# Patient Record
Sex: Female | Born: 1960 | Race: White | Hispanic: No | Marital: Married | Smoking: Never smoker
Health system: Southern US, Community
[De-identification: ages and names within clinical notes are randomized; demographics above are authoritative.]

## PROBLEM LIST (undated history)

## (undated) DIAGNOSIS — R232 Flushing: Secondary | ICD-10-CM

## (undated) DIAGNOSIS — Z923 Personal history of irradiation: Secondary | ICD-10-CM

## (undated) DIAGNOSIS — N939 Abnormal uterine and vaginal bleeding, unspecified: Secondary | ICD-10-CM

## (undated) DIAGNOSIS — C50919 Malignant neoplasm of unspecified site of unspecified female breast: Secondary | ICD-10-CM

## (undated) DIAGNOSIS — Z9221 Personal history of antineoplastic chemotherapy: Secondary | ICD-10-CM

## (undated) DIAGNOSIS — N926 Irregular menstruation, unspecified: Secondary | ICD-10-CM

## (undated) DIAGNOSIS — I499 Cardiac arrhythmia, unspecified: Secondary | ICD-10-CM

## (undated) DIAGNOSIS — R112 Nausea with vomiting, unspecified: Secondary | ICD-10-CM

## (undated) DIAGNOSIS — N840 Polyp of corpus uteri: Secondary | ICD-10-CM

## (undated) DIAGNOSIS — Z9889 Other specified postprocedural states: Secondary | ICD-10-CM

## (undated) DIAGNOSIS — R87629 Unspecified abnormal cytological findings in specimens from vagina: Secondary | ICD-10-CM

## (undated) HISTORY — DX: Polyp of corpus uteri: N84.0

## (undated) HISTORY — DX: Cardiac arrhythmia, unspecified: I49.9

## (undated) HISTORY — DX: Flushing: R23.2

## (undated) HISTORY — DX: Irregular menstruation, unspecified: N92.6

## (undated) HISTORY — PX: CHOLECYSTECTOMY: SHX55

## (undated) HISTORY — DX: Unspecified abnormal cytological findings in specimens from vagina: R87.629

## (undated) HISTORY — DX: Malignant neoplasm of unspecified site of unspecified female breast: C50.919

## (undated) HISTORY — DX: Abnormal uterine and vaginal bleeding, unspecified: N93.9

## (undated) HISTORY — PX: SHOULDER SURGERY: SHX246

---

## 1991-01-25 HISTORY — PX: CHOLECYSTECTOMY: SHX55

## 1997-06-13 ENCOUNTER — Other Ambulatory Visit: Admission: RE | Admit: 1997-06-13 | Discharge: 1997-06-13 | Payer: Self-pay | Admitting: Obstetrics and Gynecology

## 1998-06-16 ENCOUNTER — Other Ambulatory Visit: Admission: RE | Admit: 1998-06-16 | Discharge: 1998-06-16 | Payer: Self-pay | Admitting: Obstetrics and Gynecology

## 1998-06-16 ENCOUNTER — Other Ambulatory Visit: Admission: RE | Admit: 1998-06-16 | Discharge: 1998-06-16 | Payer: Self-pay | Admitting: *Deleted

## 1998-06-26 ENCOUNTER — Other Ambulatory Visit: Admission: RE | Admit: 1998-06-26 | Discharge: 1998-06-26 | Payer: Self-pay | Admitting: General Surgery

## 2000-01-25 HISTORY — PX: OTHER SURGICAL HISTORY: SHX169

## 2000-02-02 ENCOUNTER — Other Ambulatory Visit: Admission: RE | Admit: 2000-02-02 | Discharge: 2000-02-02 | Payer: Self-pay | Admitting: Obstetrics and Gynecology

## 2001-05-15 ENCOUNTER — Other Ambulatory Visit: Admission: RE | Admit: 2001-05-15 | Discharge: 2001-05-15 | Payer: Self-pay | Admitting: Obstetrics and Gynecology

## 2017-06-12 ENCOUNTER — Other Ambulatory Visit: Payer: Self-pay | Admitting: Orthopedic Surgery

## 2017-06-12 DIAGNOSIS — S43431A Superior glenoid labrum lesion of right shoulder, initial encounter: Secondary | ICD-10-CM

## 2017-06-26 ENCOUNTER — Ambulatory Visit
Admission: RE | Admit: 2017-06-26 | Discharge: 2017-06-26 | Disposition: A | Discharge status: Home / Self Care | Payer: PRIVATE HEALTH INSURANCE | Source: Ambulatory Visit | Attending: Orthopedic Surgery | Admitting: Orthopedic Surgery

## 2017-06-26 DIAGNOSIS — S43431A Superior glenoid labrum lesion of right shoulder, initial encounter: Secondary | ICD-10-CM

## 2017-06-26 MED ORDER — IOPAMIDOL (ISOVUE-M 200) INJECTION 41%
15.0000 mL | Freq: Once | INTRAMUSCULAR | Status: AC
  Administered 2017-06-26: 15 mL via INTRA_ARTICULAR

## 2017-08-15 ENCOUNTER — Encounter: Payer: Self-pay | Admitting: Physical Therapy

## 2017-08-15 ENCOUNTER — Other Ambulatory Visit: Payer: Self-pay

## 2017-08-15 ENCOUNTER — Ambulatory Visit: Payer: No Typology Code available for payment source | Attending: Orthopedic Surgery | Admitting: Physical Therapy

## 2017-08-15 DIAGNOSIS — M25511 Pain in right shoulder: Secondary | ICD-10-CM

## 2017-08-15 DIAGNOSIS — M6281 Muscle weakness (generalized): Secondary | ICD-10-CM | POA: Insufficient documentation

## 2017-08-15 DIAGNOSIS — M25611 Stiffness of right shoulder, not elsewhere classified: Secondary | ICD-10-CM | POA: Insufficient documentation

## 2017-08-15 NOTE — Therapy (Signed)
[facility] [address]  [place], [place], [address] Phone: [phone]   Fax:  [phone]  Physical Therapy Evaluation  Patient Details  Name: [name] [name] MRN: [id number] Date of Birth: 1960-04-19 Referring Provider: Tania Ade, MD   Encounter Date: 08/15/2017  PT End of Session - 08/15/17 0854    Visit Number  1    Number of Visits  17    Date for PT Re-Evaluation  10/10/17    Authorization Type  [employer] [place] [employer]    PT Start Time  0803    PT Stop Time  0848    PT Time Calculation (min)  45 min    Activity Tolerance  Patient limited by pain;Patient tolerated treatment well    Behavior During Therapy  [facility] for tasks assessed/performed       History reviewed. No pertinent past medical history.  History reviewed. No pertinent surgical history.  There were no vitals filed for this visit.   Subjective Assessment - 08/15/17 0808    Subjective  Patient reports undergoing R RTC repair on 07/31/17. Had her follow-up with MD on 08/09/17- patient reports MD took out stitches and allowed her to be out of sling intermittently. Still sleeps with sling and sleeps on L side and back. Reports no pain in R shoulder since the surgery. Initial tear occurred after falling and tripping over a piece of twine. 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     Limitations  Lifting;House hold activities    Diagnostic tests  06/26/17 R shoulder MRI: supraspinatus and infraspinatus small full-thickness tear. 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    Patient Stated Goals  move my arm and get back to work    Currently in Pain?  No/denies    Pain Location  Shoulder    Pain Orientation  Right    Pain Type  Surgical pain;Acute pain    Aggravating Factors   moving    Pain Relieving Factors  none         [facility] PT Assessment - 08/15/17 0815      Assessment   Medical  Diagnosis  R RTC Repair    Referring Provider  Tania Ade, MD    Onset Date/Surgical Date  07/31/17    Hand Dominance  Right    Next MD Visit  09/13/17    Prior Therapy  No      Precautions   Precautions  Shoulder    Precaution Comments  No lifting, overhead reaching      Restrictions   Weight Bearing Restrictions  Yes    RUE Weight Bearing  Non weight bearing      Balance Screen   Has the patient fallen in the past 6 months  Yes    How many times?  1 tripped over rope    Has the patient had a decrease in activity level because of a fear of falling?   No    Is the patient reluctant to leave their home because of a fear of falling?   No      Home Social worker  Private residence    Living Arrangements  Spouse/significant other    Available Help at Discharge  Family    Type of [place] to enter    Entrance Stairs-Number of Steps  5    Entrance Stairs-Rails  Left    Home Layout  One level      Prior Function   Level of Independence  Independent      Cognition   Overall Cognitive Status  Within Functional Limits for tasks assessed      Observation/Other Assessments   Observations  R mid humerus bruised      Sensation   Light Touch  Appears Intact      Coordination   Gross Motor Movements are Fluid and Coordinated  Yes      Posture/Postural Control   Posture/Postural Control  Postural limitations    Postural Limitations  Rounded Shoulders      ROM / Strength   AROM / PROM / Strength  Strength;PROM;AROM      AROM   AROM Assessment Site  Shoulder    Right/Left Shoulder  Left    Left Shoulder Flexion  159 Degrees    Left Shoulder ABduction  180 Degrees    Left Shoulder Internal Rotation  76 Degrees    Left Shoulder External Rotation  110 Degrees      PROM   PROM Assessment Site  Shoulder    Right/Left Shoulder  Left;Right    Right Shoulder Flexion  95 Degrees pain    Right Shoulder ABduction  50 Degrees pain     Right Shoulder Internal Rotation  65 Degrees with R UE in 45 deg ABD    Right Shoulder External Rotation  37 Degrees with R UE in 45 deg ABD    Left Shoulder Flexion  180 Degrees    Left Shoulder ABduction  180 Degrees    Left Shoulder Internal Rotation  95 Degrees    Left Shoulder External Rotation  97 Degrees      Strength   Strength Assessment Site  Shoulder    Right/Left Shoulder  Left    Left Shoulder Flexion  4+/5    Left Shoulder ABduction  4+/5    Left Shoulder Internal Rotation  4+/5    Left Shoulder External Rotation  4+/5                Objective measurements completed on examination: See above findings.              PT Education - 08/15/17 0851    Education Details  prognosis, POC, HEP    Person(s) Educated  Patient    Methods  Explanation;Demonstration;Tactile cues;Verbal cues;Handout    Comprehension  Returned demonstration;Verbalized understanding       PT Short Term Goals - 08/15/17 0903      PT SHORT TERM GOAL #1   Title  Patient to be independent with initial HEP.    Time  4    Period  Weeks    Status  New    Target Date  09/12/17        PT Long Term Goals - 08/15/17 0904      PT LONG TERM GOAL #1   Title  Patient to be independent with advanced HEP.    Time  8    Period  Weeks    Status  New    Target Date  10/10/17      PT LONG TERM GOAL #2   Title  Patient to demonstrate [facility] R shoulder AROM/PROM without pain limiting.     Time  8    Period  Weeks    Status  New    Target Date  10/10/17      PT LONG TERM  GOAL #3   Title  Patient to demonstrate R shoulder strength >=4+/5.    Time  8    Period  Weeks    Status  New    Target Date  10/10/17      PT LONG TERM GOAL #4   Title  Patient to demonstrate reaching overhead to place 5# object on overhead cabinet with <=2/10 pain.    Time  8    Period  Weeks    Status  New    Target Date  10/10/17      PT LONG TERM GOAL #5   Title  Patient to report tolerance of 1 day  at work with <=2/10 pain.    Time  8    Period  Weeks    Status  New    Target Date  10/10/17             Plan - 08/15/17 0854    Clinical Impression Statement  Patient is a 57y/o F presenting to OPPT after R RTC repair on 07/31/17. Patient reports getting stitches out at follow up appointment last week and released to be out of sling intermittently. Reports compliance with surgical precautions and no c/o pain since surgery. Patient today with limited and painful R shoulder PROM and limited functional activity tolerance. Educated patient on surgical precautions and HEP and a=given handout. Advised not to push into pain as patient has tendency to do this. Patient reported understanding. Would benefit from skilled PT services 2x/week for 8 weeks to address decreased ROM, strength, pain, and limited functional activity tolerance.     Clinical Presentation  Stable    Clinical Decision Making  Low    Rehab Potential  Good    PT Frequency  2x / week    PT Duration  8 weeks    PT Treatment/Interventions  ADLs/Self Care Home Management;Cryotherapy;Electrical Stimulation;Moist Heat;Ultrasound;Gait training;Stair training;Functional mobility training;Therapeutic activities;Therapeutic exercise;Manual techniques;Patient/family education;Scar mobilization;Passive range of motion;Dry needling;Energy conservation;Splinting;Taping;Vasopneumatic Device    PT Next Visit Plan  reassess HEP    Consulted and Agree with Plan of Care  Patient       Patient will benefit from skilled therapeutic intervention in order to improve the following deficits and impairments:  Decreased activity tolerance, Decreased strength, Impaired UE functional use, Pain, Decreased range of motion, Improper body mechanics, Postural dysfunction  Visit Diagnosis: Acute pain of right shoulder  Stiffness of right shoulder, not elsewhere classified  Muscle weakness (generalized)     Problem List There are no active problems to  display for this patient.   Manuela Neptune 08/15/2017, 9:08 AM  [facility] [address]  [place], [place], [address] Phone: [phone]   Fax:  [phone]  Name: [name] [name] MRN: [id number] Date of Birth: 1960-06-27

## 2017-08-18 ENCOUNTER — Ambulatory Visit: Payer: No Typology Code available for payment source

## 2017-08-18 DIAGNOSIS — M25511 Pain in right shoulder: Secondary | ICD-10-CM

## 2017-08-18 DIAGNOSIS — M25611 Stiffness of right shoulder, not elsewhere classified: Secondary | ICD-10-CM

## 2017-08-18 DIAGNOSIS — M6281 Muscle weakness (generalized): Secondary | ICD-10-CM

## 2017-08-18 NOTE — Therapy (Signed)
[facility] [address]  [place], [place], [address] Phone: [phone]   Fax:  [phone]  Physical Therapy Treatment  Patient Details  Name: [name] [name] MRN: [id number] Date of Birth: Jun 21, 1960 Referring Provider: Tania Ade, MD   Encounter Date: 08/18/2017  PT End of Session - 08/18/17 1107    Visit Number  2    Number of Visits  17    Date for PT Re-Evaluation  10/10/17    Authorization Type  [employer] Rule    PT Start Time  1101    PT Stop Time  1159    PT Time Calculation (min)  58 min    Activity Tolerance  Patient tolerated treatment well    Behavior During Therapy  [facility] for tasks assessed/performed       No past medical history on file.  No past surgical history on file.  There were no vitals filed for this visit.  Subjective Assessment - 08/18/17 1106    Subjective  Pt. doing well today however seen without sling.      Diagnostic tests  06/26/17 R shoulder MRI: supraspinatus and infraspinatus small full-thickness tear. 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    Patient Stated Goals  move my arm and get back to work    Currently in Pain?  No/denies    Multiple Pain Sites  No                       [facility] Adult PT Treatment/Exercise - 08/18/17 1126      Shoulder Exercises: Seated   Retraction  15 reps 5" hold; 2 sets       Shoulder Exercises: Isometric Strengthening   Flexion  -- 10 x 5" hold - 20% effort     ABduction  -- 10 x 5"  -  20% effort       Vasopneumatic   Number Minutes Vasopneumatic   10 minutes    Vasopnuematic Location   Shoulder R     Vasopneumatic Pressure  Low    Vasopneumatic Temperature   coldest temp.      Manual Therapy   Manual Therapy  Passive ROM;Soft tissue mobilization;Myofascial release    Manual therapy comments  supine     Soft tissue mobilization  STM to R UT in area of tenderness; pt. with palpable muscular TP    Myofascial Release  R TPR to R UT    Passive ROM  R shoulder PROM flexion (to 90 dg), IR/ER ~ 20 dg in scapular plane       Neck Exercises: Stretches   Upper Trapezius Stretch  Right;2 reps;30 seconds    Upper Trapezius Stretch Limitations  R arm resting in lap    Levator Stretch  Right;2 reps;30 seconds    Levator Stretch Limitations  R arm resting in lap                PT Short Term Goals - 08/18/17 1107      PT SHORT TERM GOAL #1   Title  Patient to be independent with initial HEP.    Time  4    Period  Weeks    Status  On-going        PT Long Term Goals - 08/18/17 1107      PT LONG TERM GOAL #1   Title  Patient to be independent with advanced HEP.    Time  8  Period  Weeks    Status  On-going      PT LONG TERM GOAL #2   Title  Patient to demonstrate [facility] R shoulder AROM/PROM without pain limiting.     Time  8    Period  Weeks    Status  On-going      PT LONG TERM GOAL #3   Title  Patient to demonstrate R shoulder strength >=4+/5.    Time  8    Period  Weeks    Status  On-going      PT LONG TERM GOAL #4   Title  Patient to demonstrate reaching overhead to place 5# object on overhead cabinet with <=2/10 pain.    Time  8    Period  Weeks    Status  On-going      PT LONG TERM GOAL #5   Title  Patient to report tolerance of 1 day at work with <=2/10 pain.    Time  8    Period  Weeks    Status  On-going            Plan - 08/18/17 1107    Clinical Impression Statement  [name] [name] doing well today.  Seen today not wearing shoulder sling.  Did present with R UT tightness and tenderness, which was addressed with gentle stretching and STM/TPR with good relief.  Tolerated all PROM and scapular squeeze activities well today.  Ended with ice/compression to lessen post-therapy swelling and pain.  Left therapy pain free.      PT Treatment/Interventions  ADLs/Self Care Home Management;Cryotherapy;Electrical Stimulation;Moist Heat;Ultrasound;Gait training;Stair  training;Functional mobility training;Therapeutic activities;Therapeutic exercise;Manual techniques;Patient/family education;Scar mobilization;Passive range of motion;Dry needling;Energy conservation;Splinting;Taping;Vasopneumatic Device    Consulted and Agree with Plan of Care  Patient       Patient will benefit from skilled therapeutic intervention in order to improve the following deficits and impairments:  Decreased activity tolerance, Decreased strength, Impaired UE functional use, Pain, Decreased range of motion, Improper body mechanics, Postural dysfunction  Visit Diagnosis: Acute pain of right shoulder  Stiffness of right shoulder, not elsewhere classified  Muscle weakness (generalized)     Problem List There are no active problems to display for this patient.   Bess Harvest, PTA 08/18/17 12:08 PM   [facility] [address]  [place], [place], [address] Phone: [phone]   Fax:  [phone]  Name: [name] [name] MRN: [id number] Date of Birth: [birth year]

## 2017-08-22 ENCOUNTER — Ambulatory Visit: Payer: No Typology Code available for payment source

## 2017-08-22 DIAGNOSIS — M6281 Muscle weakness (generalized): Secondary | ICD-10-CM

## 2017-08-22 DIAGNOSIS — M25511 Pain in right shoulder: Secondary | ICD-10-CM

## 2017-08-22 DIAGNOSIS — M25611 Stiffness of right shoulder, not elsewhere classified: Secondary | ICD-10-CM

## 2017-08-22 NOTE — Therapy (Signed)
[facility] [address]  [place], [place], [address] Phone: [phone]   Fax:  [phone]  Physical Therapy Treatment  Patient Details  Name: [name] [name] MRN: [id number] Date of Birth: May 16, 1960 Referring Provider: Tania Ade, MD   Encounter Date: 08/22/2017  PT End of Session - 08/22/17 0902    Visit Number  3    Number of Visits  17    Date for PT Re-Evaluation  10/10/17    Authorization Type  [employer] Rule    PT Start Time  [phone]    PT Stop Time  [phone]    PT Time Calculation (min)  48 min    Activity Tolerance  Patient tolerated treatment well    Behavior During Therapy  [facility] for tasks assessed/performed       No past medical history on file.  No past surgical history on file.  There were no vitals filed for this visit.  Subjective Assessment - 08/22/17 0859    Subjective  Pt. doing well today.      Diagnostic tests  06/26/17 R shoulder MRI: supraspinatus and infraspinatus small full-thickness tear. 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    Patient Stated Goals  move my arm and get back to work    Currently in Pain?  No/denies    Multiple Pain Sites  No                       [facility] Adult PT Treatment/Exercise - 08/22/17 0905      Shoulder Exercises: Supine   Other Supine Exercises  Hooklying scapular retraction 5" x 15 reps       Shoulder Exercises: Seated   Retraction  15 reps 5" hold     Retraction Limitations  Manually resisted by therapist at B medial scap. border     Other Seated Exercises  R "grip squeezer" 5" x 15 reps easiest setting     Other Seated Exercises  R wrist flexion, extension 2# x 10 each way  elbow in neutral       Shoulder Exercises: Standing   Other Standing Exercises  R biceps curl x 15 reps       Shoulder Exercises: ROM/Strengthening   Pendulum  R shoulder pendulums horizontal, vertical, CW, CCW x 10 each way       Shoulder  Exercises: Isometric Strengthening   Flexion  -- 10" x 15 reps     ABduction  -- 10" x 15 reps       Vasopneumatic   Number Minutes Vasopneumatic   10 minutes    Vasopnuematic Location   Shoulder    Vasopneumatic Pressure  Low    Vasopneumatic Temperature   coldest temp.      Manual Therapy   Manual Therapy  Passive ROM;Soft tissue mobilization;Myofascial release    Manual therapy comments  supine     Soft tissue mobilization  STM to R UT in area of tenderness; pt. with palpable muscular TP    Myofascial Release  R TPR to R UT    Passive ROM  R shoulder PROM flexion (to 90 dg), IR/ER ~ 20 dg in scapular plane                PT Short Term Goals - 08/18/17 1107      PT SHORT TERM GOAL #1   Title  Patient to be independent with initial HEP.    Time  4  Period  Weeks    Status  On-going        PT Long Term Goals - 08/18/17 1107      PT LONG TERM GOAL #1   Title  Patient to be independent with advanced HEP.    Time  8    Period  Weeks    Status  On-going      PT LONG TERM GOAL #2   Title  Patient to demonstrate [facility] R shoulder AROM/PROM without pain limiting.     Time  8    Period  Weeks    Status  On-going      PT LONG TERM GOAL #3   Title  Patient to demonstrate R shoulder strength >=4+/5.    Time  8    Period  Weeks    Status  On-going      PT LONG TERM GOAL #4   Title  Patient to demonstrate reaching overhead to place 5# object on overhead cabinet with <=2/10 pain.    Time  8    Period  Weeks    Status  On-going      PT LONG TERM GOAL #5   Title  Patient to report tolerance of 1 day at work with <=2/10 pain.    Time  8    Period  Weeks    Status  On-going            Plan - 08/22/17 3893    Clinical Impression Statement  [name] [name] doing well today noting she is not having significant R shoulder pain and not wearing sling frequently.  Tolerated mild progression of scapular strengthening, and addition of wrist and forearm strengthening  activities well today.  Able to demo improvement in R shoulder pendulum technique today without pain.  Ended session with ice/compression to R shoulder to decrease post-session soreness.  Will continue to progress toward goals per protocol.      PT Treatment/Interventions  ADLs/Self Care Home Management;Cryotherapy;Electrical Stimulation;Moist Heat;Ultrasound;Gait training;Stair training;Functional mobility training;Therapeutic activities;Therapeutic exercise;Manual techniques;Patient/family education;Scar mobilization;Passive range of motion;Dry needling;Energy conservation;Splinting;Taping;Vasopneumatic Device    Consulted and Agree with Plan of Care  Patient       Patient will benefit from skilled therapeutic intervention in order to improve the following deficits and impairments:  Decreased activity tolerance, Decreased strength, Impaired UE functional use, Pain, Decreased range of motion, Improper body mechanics, Postural dysfunction  Visit Diagnosis: Acute pain of right shoulder  Stiffness of right shoulder, not elsewhere classified  Muscle weakness (generalized)     Problem List There are no active problems to display for this patient.   Bess Harvest, PTA 08/22/17 10:39 AM   [facility] [address]  [place], [place], [address] Phone: [phone]   Fax:  [phone]  Name: [name] [name] MRN: [id number] Date of Birth: [birth year]

## 2017-08-24 ENCOUNTER — Ambulatory Visit: Payer: No Typology Code available for payment source | Attending: Orthopedic Surgery | Admitting: Physical Therapy

## 2017-08-24 ENCOUNTER — Encounter: Payer: Self-pay | Admitting: Physical Therapy

## 2017-08-24 DIAGNOSIS — M25611 Stiffness of right shoulder, not elsewhere classified: Secondary | ICD-10-CM

## 2017-08-24 DIAGNOSIS — M6281 Muscle weakness (generalized): Secondary | ICD-10-CM

## 2017-08-24 DIAGNOSIS — M25511 Pain in right shoulder: Secondary | ICD-10-CM | POA: Diagnosis not present

## 2017-08-24 NOTE — Therapy (Signed)
[facility] [address]  [place], [place], [address] Phone: [phone]   Fax:  [phone]  Physical Therapy Treatment  Patient Details  Name: [name] [name] MRN: [id number] Date of Birth: [birth year] Referring Provider: Tania Ade, MD   Encounter Date: 08/24/2017  PT End of Session - 08/24/17 0925    Visit Number  4    Number of Visits  17    Date for PT Re-Evaluation  10/10/17    Authorization Type  [employer] Rule    PT Start Time  [phone]    PT Stop Time  0934    PT Time Calculation (min)  40 min    Activity Tolerance  Patient tolerated treatment well    Behavior During Therapy  [facility] for tasks assessed/performed       History reviewed. No pertinent past medical history.  History reviewed. No pertinent surgical history.  There were no vitals filed for this visit.  Subjective Assessment - 08/24/17 0854    Subjective  Reports compliance with HEP. No pain.    Diagnostic tests  06/26/17 R shoulder MRI: supraspinatus and infraspinatus small full-thickness tear. 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    Patient Stated Goals  move my arm and get back to work    Currently in Pain?  No/denies                       [facility] Adult PT Treatment/Exercise - 08/24/17 0001      Exercises   Exercises  Shoulder      Shoulder Exercises: Supine   Other Supine Exercises  Hooklying scapular retraction 3" x 15 reps       Shoulder Exercises: Seated   Elevation  AROM;Both;10 reps;Limitations    Elevation Limitations  B shoulder shrugs 10x3"    Other Seated Exercises  R elbow flexion with elbow supported x 15    Other Seated Exercises  R wrist flexion, extension 2#  each way       Shoulder Exercises: Standing   Other Standing Exercises  Pendulum ant/pos, M/L, circumduction CW/CCW x 30 sec each cues to avoid intensity and relax shoulder      Shoulder Exercises: Stretch   Other Shoulder  Stretches  R UT stretch 2x20"' R LS stretch 2x20"      Vasopneumatic   Number Minutes Vasopneumatic   10 minutes    Vasopnuematic Location   Shoulder    Vasopneumatic Pressure  Low    Vasopneumatic Temperature   coldest temp.      Manual Therapy   Manual Therapy  Passive ROM;Soft tissue mobilization;Myofascial release    Manual therapy comments  supine     Soft tissue mobilization  STM to R UT, bicep, lateral deltoid in area of tenderness; pt. with palpable muscular TP and edema to lateral deltoid    Passive ROM  R shoulder PROM flexion (to 90 dg), IR/ER ~ 20 dg in scapular plane              PT Education - 08/24/17 0957    Education Details  addition to HEP    Person(s) Educated  Patient    Methods  Explanation;Demonstration;Tactile cues;Verbal cues;Handout    Comprehension  Returned demonstration;Verbalized understanding       PT Short Term Goals - 08/18/17 1107      PT SHORT TERM GOAL #1   Title  Patient to be independent with initial HEP.    Time  4    Period  Weeks    Status  On-going        PT Long Term Goals - 08/18/17 1107      PT LONG TERM GOAL #1   Title  Patient to be independent with advanced HEP.    Time  8    Period  Weeks    Status  On-going      PT LONG TERM GOAL #2   Title  Patient to demonstrate [facility] R shoulder AROM/PROM without pain limiting.     Time  8    Period  Weeks    Status  On-going      PT LONG TERM GOAL #3   Title  Patient to demonstrate R shoulder strength >=4+/5.    Time  8    Period  Weeks    Status  On-going      PT LONG TERM GOAL #4   Title  Patient to demonstrate reaching overhead to place 5# object on overhead cabinet with <=2/10 pain.    Time  8    Period  Weeks    Status  On-going      PT LONG TERM GOAL #5   Title  Patient to report tolerance of 1 day at work with <=2/10 pain.    Time  8    Period  Weeks    Status  On-going            Plan - 08/24/17 0925    Clinical Impression Statement  Patient  arrived to session with no new complaints Reviewed pendulum with patient- cues given to decrease intensity and speed of movement- improved after feedback. Tolerated R shoulder PROM to tolerance- patient limited by pain at end range and muscle guarding. Good tolerance of STM to R UT, bicep, lateral deltoid- soft tissue in these areas and marked edema in lateral deltoid. Advised patient to ice R shoulder for 15 minutes at a time at home. Also advised to support R elbow when performing wrist and elbow ROM. Updated HEP with additional exercises and administered handout. Patient reported understanding. Received [place] to R shoulder at end of session. Normal integumentary response and relief noted.    PT Treatment/Interventions  ADLs/Self Care Home Management;Cryotherapy;Electrical Stimulation;Moist Heat;Ultrasound;Gait training;Stair training;Functional mobility training;Therapeutic activities;Therapeutic exercise;Manual techniques;Patient/family education;Scar mobilization;Passive range of motion;Dry needling;Energy conservation;Splinting;Taping;Vasopneumatic Device    Consulted and Agree with Plan of Care  Patient       Patient will benefit from skilled therapeutic intervention in order to improve the following deficits and impairments:  Decreased activity tolerance, Decreased strength, Impaired UE functional use, Pain, Decreased range of motion, Improper body mechanics, Postural dysfunction  Visit Diagnosis: Acute pain of right shoulder  Stiffness of right shoulder, not elsewhere classified  Muscle weakness (generalized)     Problem List There are no active problems to display for this patient.  Janene Harvey, PT, DPT 08/24/17 10:23 AM   [facility] [address]  [place], [place], [address] Phone: [phone]   Fax:  [phone]  Name: [name] [name] MRN: [id number] Date of Birth: [birth year]

## 2017-08-29 ENCOUNTER — Ambulatory Visit: Payer: No Typology Code available for payment source

## 2017-08-29 DIAGNOSIS — M25511 Pain in right shoulder: Secondary | ICD-10-CM | POA: Diagnosis not present

## 2017-08-29 DIAGNOSIS — M25611 Stiffness of right shoulder, not elsewhere classified: Secondary | ICD-10-CM

## 2017-08-29 DIAGNOSIS — M6281 Muscle weakness (generalized): Secondary | ICD-10-CM

## 2017-08-29 NOTE — Therapy (Signed)
[facility] [address]  [place], [place], [address] Phone: [phone]   Fax:  [phone]  Physical Therapy Treatment  Patient Details  Name: [name] [name] MRN: [id number] Date of Birth: 02/11/1960 Referring Provider: Tania Ade, MD   Encounter Date: 08/29/2017  PT End of Session - 08/29/17 0858    Visit Number  5    Number of Visits  17    Date for PT Re-Evaluation  10/10/17    Authorization Type  [employer]    PT Start Time  [phone] Pt. arrived late thus session limited.     PT Stop Time  0940    PT Time Calculation (min)  44 min    Activity Tolerance  Patient tolerated treatment well    Behavior During Therapy  [facility] for tasks assessed/performed       No past medical history on file.  No past surgical history on file.  There were no vitals filed for this visit.  Subjective Assessment - 08/29/17 0859    Subjective  Pt. noting she is performing HEP daily.      Diagnostic tests  06/26/17 R shoulder MRI: supraspinatus and infraspinatus small full-thickness tear. 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    Patient Stated Goals  move my arm and get back to work    Currently in Pain?  No/denies    Multiple Pain Sites  No                       [facility] Adult PT Treatment/Exercise - 08/29/17 0911      Elbow Exercises   Elbow Flexion  Right;20 reps    Bar Weights/Barbell (Elbow Flexion)  1 lb    Forearm Supination  Right;20 reps    Bar Weights/Barbell (Forearm Supination)  1 lb    Forearm Pronation  Right;20 reps;Strengthening    Bar Weights/Barbell (Forearm Pronation)  1 lb      Shoulder Exercises: Standing   Retraction  Both;20 reps 5" hold at doorseal       Shoulder Exercises: Isometric Strengthening   Flexion  -- 15" x 10 reps     ABduction  -- 15" x 10 resp       Wrist Exercises   Wrist Flexion  20 reps;Right    Bar Weights/Barbell (Wrist Flexion)  2 lbs    Wrist Extension  20 reps;Right    Bar Weights/Barbell (Wrist Extension)  2 lbs      Vasopneumatic   Number Minutes Vasopneumatic   10 minutes    Vasopnuematic Location   Shoulder    Vasopneumatic Pressure  Low    Vasopneumatic Temperature   coldest temp.      Manual Therapy   Manual Therapy  Passive ROM;Soft tissue mobilization;Myofascial release    Manual therapy comments  supine     Passive ROM  R shoulder PROM flexion, IR/ER, scaption to tolerance - pain free well tolerated              PT Education - 08/29/17 0941    Education Details  HEP update     Person(s) Educated  Patient    Methods  Explanation;Demonstration;Verbal cues;Handout    Comprehension  Verbalized understanding;Returned demonstration;Verbal cues required;Need further instruction       PT Short Term Goals - 08/18/17 1107      PT SHORT TERM GOAL #1   Title  Patient to be independent with initial HEP.  Time  4    Period  Weeks    Status  On-going        PT Long Term Goals - 08/18/17 1107      PT LONG TERM GOAL #1   Title  Patient to be independent with advanced HEP.    Time  8    Period  Weeks    Status  On-going      PT LONG TERM GOAL #2   Title  Patient to demonstrate [facility] R shoulder AROM/PROM without pain limiting.     Time  8    Period  Weeks    Status  On-going      PT LONG TERM GOAL #3   Title  Patient to demonstrate R shoulder strength >=4+/5.    Time  8    Period  Weeks    Status  On-going      PT LONG TERM GOAL #4   Title  Patient to demonstrate reaching overhead to place 5# object on overhead cabinet with <=2/10 pain.    Time  8    Period  Weeks    Status  On-going      PT LONG TERM GOAL #5   Title  Patient to report tolerance of 1 day at work with <=2/10 pain.    Time  8    Period  Weeks    Status  On-going            Plan - 08/29/17 1655    Clinical Impression Statement  Pt. arrived 11 min late to session today thus treatment time limited.  Pt. with visible  improvement in PROM in all directions today and tolerated all gentle elbow, forearm, and scapular strengthening activities well today.  Ended session with ice/compression to reduce post-exercise swelling and pain per pt. request.  Will continue to progress toward goals.      PT Treatment/Interventions  ADLs/Self Care Home Management;Cryotherapy;Electrical Stimulation;Moist Heat;Ultrasound;Gait training;Stair training;Functional mobility training;Therapeutic activities;Therapeutic exercise;Manual techniques;Patient/family education;Scar mobilization;Passive range of motion;Dry needling;Energy conservation;Splinting;Taping;Vasopneumatic Device    Consulted and Agree with Plan of Care  Patient       Patient will benefit from skilled therapeutic intervention in order to improve the following deficits and impairments:  Decreased activity tolerance, Decreased strength, Impaired UE functional use, Pain, Decreased range of motion, Improper body mechanics, Postural dysfunction  Visit Diagnosis: Acute pain of right shoulder  Stiffness of right shoulder, not elsewhere classified  Muscle weakness (generalized)     Problem List There are no active problems to display for this patient.   Bess Harvest, PTA 08/29/17 12:27 PM    [facility] [address]  [place], [place], [address] Phone: [phone]   Fax:  [phone]  Name: Jonet Mathies MRN: [id number] Date of Birth: [birth year]

## 2017-09-05 ENCOUNTER — Encounter: Payer: PRIVATE HEALTH INSURANCE | Admitting: Physical Therapy

## 2017-09-12 ENCOUNTER — Ambulatory Visit: Payer: No Typology Code available for payment source

## 2017-09-12 DIAGNOSIS — M25511 Pain in right shoulder: Secondary | ICD-10-CM | POA: Diagnosis not present

## 2017-09-12 DIAGNOSIS — M25611 Stiffness of right shoulder, not elsewhere classified: Secondary | ICD-10-CM

## 2017-09-12 DIAGNOSIS — M6281 Muscle weakness (generalized): Secondary | ICD-10-CM

## 2017-09-12 NOTE — Therapy (Signed)
[facility] [address]  [place], [place], [address] Phone: [phone]   Fax:  [phone]  Physical Therapy Treatment  Patient Details  Name: [name] [name] MRN: [id number] Date of Birth: 10/24/1960 Referring Provider: Tania Ade, MD   Encounter Date: 09/12/2017  PT End of Session - 09/12/17 0900    Visit Number  6    Number of Visits  17    Date for PT Re-Evaluation  10/10/17    Authorization Type  [employer] [place] [employer]    PT Start Time  [phone]    PT Stop Time  0944    PT Time Calculation (min)  48 min    Activity Tolerance  Patient tolerated treatment well    Behavior During Therapy  [facility] for tasks assessed/performed       No past medical history on file.  No past surgical history on file.  There were no vitals filed for this visit.  Subjective Assessment - 09/12/17 0859    Subjective  Pt. noting she has been doing well and had a good vacation.      Diagnostic tests  06/26/17 R shoulder MRI: supraspinatus and infraspinatus small full-thickness tear. 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    Patient Stated Goals  move my arm and get back to work    Currently in Pain?  No/denies    Multiple Pain Sites  No         [facility] PT Assessment - 09/12/17 0906      PROM   Right Shoulder Flexion  130 Degrees    Right Shoulder ABduction  88 Degrees    Right Shoulder Internal Rotation  85 Degrees    Right Shoulder External Rotation  38 Degrees   in scapular plane                  [facility] Adult PT Treatment/Exercise - 09/12/17 0921      Elbow Exercises   Elbow Flexion  Right;20 reps    Bar Weights/Barbell (Elbow Flexion)  3 lbs      Shoulder Exercises: Supine   External Rotation  Right;AAROM;15 reps    External Rotation Limitations  wand; scapular plane    Flexion  Right;AAROM;15 reps    Flexion Limitations  wand; scapular plane    ABduction  Right;10 reps;AAROM    ABduction  Limitations  wand; scaption      Shoulder Exercises: Seated   Other Seated Exercises  R shoulder flexion AAROM red p-ball rollouts x 10 reps       Wrist Exercises   Wrist Flexion  20 reps;Right    Bar Weights/Barbell (Wrist Flexion)  2 lbs    Wrist Extension  20 reps;Right    Bar Weights/Barbell (Wrist Extension)  2 lbs      Vasopneumatic   Number Minutes Vasopneumatic   10 minutes    Vasopnuematic Location   Shoulder    Vasopneumatic Pressure  Low    Vasopneumatic Temperature   coldest temp.      Manual Therapy   Manual Therapy  Passive ROM;Soft tissue mobilization;Myofascial release    Manual therapy comments  supine     Passive ROM  R shoulder PROM flexion, IR/ER, scaption to tolerance - pain free well tolerated              PT Education - 09/12/17 1236    Education Details  HEP update     Person(s) Educated  Patient    Methods  Explanation;Demonstration;Verbal cues;Handout    Comprehension  Verbalized understanding;Returned demonstration;Verbal cues required;Need further instruction       PT Short Term Goals - 09/12/17 0901      PT SHORT TERM GOAL #1   Title  Patient to be independent with initial HEP.    Time  4    Period  Weeks    Status  Achieved        PT Long Term Goals - 09/12/17 0913      PT LONG TERM GOAL #1   Title  Patient to be independent with advanced HEP.    Time  8    Period  Weeks    Status  Partially Met   Met for current      PT LONG TERM GOAL #2   Title  Patient to demonstrate [facility] R shoulder AROM/PROM without pain limiting.     Time  8    Period  Weeks    Status  On-going      PT LONG TERM GOAL #3   Title  Patient to demonstrate R shoulder strength >=4+/5.    Time  8    Period  Weeks    Status  On-going      PT LONG TERM GOAL #4   Title  Patient to demonstrate reaching overhead to place 5# object on overhead cabinet with <=2/10 pain.    Time  8    Period  Weeks    Status  On-going      PT LONG TERM GOAL #5   Title   Patient to report tolerance of 1 day at work with <=2/10 pain.    Time  8    Period  Weeks    Status  On-going            Plan - 09/12/17 1235    Clinical Impression Statement  [name] [name] doing well today.  Reports she had a good vacation without pain with exception of short-lasting pain after "twitching to swat a bug".  Tolerated progression into AAROM wand activities well today per protocol.  HEP updated.  Pt. able to demo good improvement of PROM today with flexion 130 dg, abduction 88 dg, IR 85 dg, and ER 38 dg.  Pt. to see MD tomorrow for f/u and progressing well per protocol.  Will monitor response in coming visits.      PT Treatment/Interventions  ADLs/Self Care Home Management;Cryotherapy;Electrical Stimulation;Moist Heat;Ultrasound;Gait training;Stair training;Functional mobility training;Therapeutic activities;Therapeutic exercise;Manual techniques;Patient/family education;Scar mobilization;Passive range of motion;Dry needling;Energy conservation;Splinting;Taping;Vasopneumatic Device    PT Next Visit Plan  reassess updated HEP    Consulted and Agree with Plan of Care  Patient       Patient will benefit from skilled therapeutic intervention in order to improve the following deficits and impairments:  Decreased activity tolerance, Decreased strength, Impaired UE functional use, Pain, Decreased range of motion, Improper body mechanics, Postural dysfunction  Visit Diagnosis: Acute pain of right shoulder  Stiffness of right shoulder, not elsewhere classified  Muscle weakness (generalized)     Problem List There are no active problems to display for this patient.  [name] [name], PTA 09/12/17 12:43 PM   [facility] [address]  [place], [place], [address] Phone: [phone]   Fax:  [phone]  Name: [name] [name] MRN: [id number] Date of Birth: 1960-03-22

## 2017-09-15 ENCOUNTER — Ambulatory Visit: Payer: No Typology Code available for payment source | Admitting: Physical Therapy

## 2017-09-15 DIAGNOSIS — M25611 Stiffness of right shoulder, not elsewhere classified: Secondary | ICD-10-CM

## 2017-09-15 DIAGNOSIS — M25511 Pain in right shoulder: Secondary | ICD-10-CM

## 2017-09-15 DIAGNOSIS — M6281 Muscle weakness (generalized): Secondary | ICD-10-CM

## 2017-09-15 NOTE — Therapy (Signed)
[facility] [address]  [place], [place], [address] Phone: [phone]   Fax:  [phone]  Physical Therapy Treatment  Patient Details  Name: [name] [name] MRN: [id number] Date of Birth: 11/14/1960 Referring Provider: Tania Ade, MD   Encounter Date: 09/15/2017  PT End of Session - 09/15/17 0933    Visit Number  7    Number of Visits  17    Date for PT Re-Evaluation  10/10/17    Authorization Type  [employer]    PT Start Time  [phone]   patient arrived late   PT Stop Time  0934    PT Time Calculation (min)  40 min    Activity Tolerance  Patient tolerated treatment well    Behavior During Therapy  [facility] for tasks assessed/performed       No past medical history on file.  No past surgical history on file.  There were no vitals filed for this visit.  Subjective Assessment - 09/15/17 0855    Subjective  Reports everything is going good. Denies pain after last session.     Diagnostic tests  06/26/17 R shoulder MRI: supraspinatus and infraspinatus small full-thickness tear. 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    Patient Stated Goals  move my arm and get back to work    Currently in Pain?  No/denies                       [facility] Adult PT Treatment/Exercise - 09/15/17 0001      Exercises   Exercises  Shoulder      Shoulder Exercises: Supine   External Rotation  Right;AAROM;10 reps    External Rotation Limitations  wand; scapular plane with elbow propped on folded pillow    Flexion  Right;AAROM;10 reps;Limitations    Flexion Limitations  wand; scapular plane    ABduction  Right;10 reps;AAROM    ABduction Limitations  wand; scaption with elbow propped on folded pillow    Other Supine Exercises  R shoulder flexion AROM in scapular plane to tolerance x10      Shoulder Exercises: Seated   Flexion  AAROM;Both;10 reps;Limitations    Flexion Limitations  flexion rollouts  on pball 10x3"      Shoulder Exercises: Prone   Other Prone Exercises  R shoulder prone row x10   cues for scap retraction     Shoulder Exercises: Standing   Other Standing Exercises  Pendulum ant/pos, M/L, circumduction CW/CCW x 30 sec each   cues to decrease intensity     Manual Therapy   Passive ROM  R shoulder PROM in scapular plane to tolerance             PT Education - 09/15/17 0933    Education Details  update to HEP    Person(s) Educated  Patient    Methods  Explanation;Demonstration;Tactile cues;Verbal cues;Handout    Comprehension  Returned demonstration;Verbalized understanding       PT Short Term Goals - 09/12/17 0901      PT SHORT TERM GOAL #1   Title  Patient to be independent with initial HEP.    Time  4    Period  Weeks    Status  Achieved        PT Long Term Goals - 09/12/17 0913      PT LONG TERM GOAL #1   Title  Patient to be independent with advanced HEP.    Time  8    Period  Weeks    Status  Partially Met   Met for current      PT LONG TERM GOAL #2   Title  Patient to demonstrate [facility] R shoulder AROM/PROM without pain limiting.     Time  8    Period  Weeks    Status  On-going      PT LONG TERM GOAL #3   Title  Patient to demonstrate R shoulder strength >=4+/5.    Time  8    Period  Weeks    Status  On-going      PT LONG TERM GOAL #4   Title  Patient to demonstrate reaching overhead to place 5# object on overhead cabinet with <=2/10 pain.    Time  8    Period  Weeks    Status  On-going      PT LONG TERM GOAL #5   Title  Patient to report tolerance of 1 day at work with <=2/10 pain.    Time  8    Period  Weeks    Status  On-going            Plan - 09/15/17 0934    Clinical Impression Statement  Patient arrived to session with no new complaints. Reports she saw MD who released her from her sling. Reports R shoulder is feeling better since starting AAROM exercises. Patient tolerated R shoulder PROM in scapular plane  without issues. Reviewed R shoulder AAROM with wand- patient with great ROM and no pain throughout. Patient unfamiliar with ER/ER AAROM- introduced this exercise and added to HEP with special instruction to keep elbow in at trunk and in scapular plane.  Introduced prone row and supine AROM flexion- patient with good control. Updated HEP with new exercises performed today. Patient received [employer] at end of session for edema and pain control. Denied pain at conclusion of session.    PT Treatment/Interventions  ADLs/Self Care Home Management;Cryotherapy;Electrical Stimulation;Moist Heat;Ultrasound;Gait training;Stair training;Functional mobility training;Therapeutic activities;Therapeutic exercise;Manual techniques;Patient/family education;Scar mobilization;Passive range of motion;Dry needling;Energy conservation;Splinting;Taping;Vasopneumatic Device    Consulted and Agree with Plan of Care  Patient       Patient will benefit from skilled therapeutic intervention in order to improve the following deficits and impairments:  Decreased activity tolerance, Decreased strength, Impaired UE functional use, Pain, Decreased range of motion, Improper body mechanics, Postural dysfunction  Visit Diagnosis: Acute pain of right shoulder  Muscle weakness (generalized)  Stiffness of right shoulder, not elsewhere classified     Problem List There are no active problems to display for this patient.   Janene Harvey, PT, DPT 09/15/17 9:36 AM   [facility] [address]  [place], [place], [address] Phone: [phone]   Fax:  [phone]  Name: Aysia Lowder MRN: [id number] Date of Birth: 1960/12/18

## 2017-09-19 ENCOUNTER — Ambulatory Visit: Payer: No Typology Code available for payment source

## 2017-09-19 DIAGNOSIS — M6281 Muscle weakness (generalized): Secondary | ICD-10-CM

## 2017-09-19 DIAGNOSIS — M25511 Pain in right shoulder: Secondary | ICD-10-CM

## 2017-09-19 DIAGNOSIS — M25611 Stiffness of right shoulder, not elsewhere classified: Secondary | ICD-10-CM

## 2017-09-19 NOTE — Therapy (Signed)
[facility] [address]  [place], [place], [address] Phone: [phone]   Fax:  [phone]  Physical Therapy Treatment  Patient Details  Name: [name] [name] MRN: [id number] Date of Birth: [birth year] Referring Provider: Tania Ade, MD   Encounter Date: 09/19/2017  PT End of Session - 09/19/17 0906    Visit Number  8    Number of Visits  17    Date for PT Re-Evaluation  10/10/17    Authorization Type  [employer]    PT Start Time  [phone]    PT Stop Time  [phone]    PT Time Calculation (min)  48 min    Activity Tolerance  Patient tolerated treatment well    Behavior During Therapy  [facility] for tasks assessed/performed       No past medical history on file.  No past surgical history on file.  There were no vitals filed for this visit.  Subjective Assessment - 09/19/17 0905    Subjective  Pt. doing well today.      Diagnostic tests  06/26/17 R shoulder MRI: supraspinatus and infraspinatus small full-thickness tear. 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    Patient Stated Goals  move my arm and get back to work    Currently in Pain?  No/denies    Multiple Pain Sites  No                       [facility] Adult PT Treatment/Exercise - 09/19/17 0908      Shoulder Exercises: Supine   External Rotation  Right;AROM;10 reps    External Rotation Limitations  ER/IR to tolerance   well tolerated    Other Supine Exercises  R shoulder flexion AROM in scapular plane to tolerance x15      Shoulder Exercises: Seated   Flexion  AAROM;15 reps;Right;Limitations    Flexion Limitations  flexion rollouts on pball 10x3"    Abduction  Right;15 reps;AAROM    ABduction Limitations  scaption table slide       Shoulder Exercises: Sidelying   ABduction  Right;AROM;10 reps   well tolerated   ABduction Limitations  Initially therapist manually guided then pt. finishing set out by herself without issue        Shoulder Exercises: Isometric Strengthening   External Rotation  --   5" x 10 reps    Internal Rotation  --   5" x 10 reps      Vasopneumatic   Number Minutes Vasopneumatic   10 minutes    Vasopnuematic Location   Shoulder    Vasopneumatic Pressure  Low    Vasopneumatic Temperature   coldest temp.      Manual Therapy   Manual Therapy  Passive ROM    Passive ROM  R shoulder PROM in scapular plane to tolerance             PT Education - 09/19/17 1215    Education Details  HEP update    Person(s) Educated  Patient    Methods  Explanation;Demonstration;Verbal cues;Handout    Comprehension  Verbalized understanding;Returned demonstration;Verbal cues required;Need further instruction       PT Short Term Goals - 09/12/17 0901      PT SHORT TERM GOAL #1   Title  Patient to be independent with initial HEP.    Time  4    Period  Weeks    Status  Achieved  PT Long Term Goals - 09/12/17 0913      PT LONG TERM GOAL #1   Title  Patient to be independent with advanced HEP.    Time  8    Period  Weeks    Status  Partially Met   Met for current      PT LONG TERM GOAL #2   Title  Patient to demonstrate [facility] R shoulder AROM/PROM without pain limiting.     Time  8    Period  Weeks    Status  On-going      PT LONG TERM GOAL #3   Title  Patient to demonstrate R shoulder strength >=4+/5.    Time  8    Period  Weeks    Status  On-going      PT LONG TERM GOAL #4   Title  Patient to demonstrate reaching overhead to place 5# object on overhead cabinet with <=2/10 pain.    Time  8    Period  Weeks    Status  On-going      PT LONG TERM GOAL #5   Title  Patient to report tolerance of 1 day at work with <=2/10 pain.    Time  8    Period  Weeks    Status  On-going            Plan - 09/19/17 0907    Clinical Impression Statement  Greer doing well today and denies recent shoulder pain.  Tolerated addition of R shoulder IR/ER RTC isometrics well today  and progressing well with AAROM.  Ended visit with mild R shoulder soreness thus applied ice/compression to R shoulder to decrease post-exercise soreness and swelling.  Progressing well toward goals.      PT Treatment/Interventions  ADLs/Self Care Home Management;Cryotherapy;Electrical Stimulation;Moist Heat;Ultrasound;Gait training;Stair training;Functional mobility training;Therapeutic activities;Therapeutic exercise;Manual techniques;Patient/family education;Scar mobilization;Passive range of motion;Dry needling;Energy conservation;Splinting;Taping;Vasopneumatic Device    Consulted and Agree with Plan of Care  Patient       Patient will benefit from skilled therapeutic intervention in order to improve the following deficits and impairments:  Decreased activity tolerance, Decreased strength, Impaired UE functional use, Pain, Decreased range of motion, Improper body mechanics, Postural dysfunction  Visit Diagnosis: Acute pain of right shoulder  Muscle weakness (generalized)  Stiffness of right shoulder, not elsewhere classified     Problem List There are no active problems to display for this patient.   Bess Harvest, PTA 09/19/17 12:18 PM   [facility] [address]  [place], [place], [address] Phone: [phone]   Fax:  [phone]  Name: [name] [name] MRN: [id number] Date of Birth: [birth year]

## 2017-09-22 ENCOUNTER — Encounter: Payer: Self-pay | Admitting: Physical Therapy

## 2017-09-22 ENCOUNTER — Ambulatory Visit: Payer: No Typology Code available for payment source | Admitting: Physical Therapy

## 2017-09-22 DIAGNOSIS — M25511 Pain in right shoulder: Secondary | ICD-10-CM | POA: Diagnosis not present

## 2017-09-22 DIAGNOSIS — M6281 Muscle weakness (generalized): Secondary | ICD-10-CM

## 2017-09-22 DIAGNOSIS — M25611 Stiffness of right shoulder, not elsewhere classified: Secondary | ICD-10-CM

## 2017-09-22 NOTE — Therapy (Signed)
[facility] [address]  [place], [place], [address] Phone: [phone]   Fax:  [phone]  Physical Therapy Treatment  Patient Details  Name: [name] [name] MRN: [id number] Date of Birth: 11-Mar-1960 Referring Provider: Tania Ade, MD   Encounter Date: 09/22/2017  PT End of Session - 09/22/17 1133    Visit Number  9    Number of Visits  17    Date for PT Re-Evaluation  10/10/17    Authorization Type  [employer]    PT Start Time  [phone]    PT Stop Time  0935    PT Time Calculation (min)  43 min    Activity Tolerance  Patient tolerated treatment well    Behavior During Therapy  [facility] for tasks assessed/performed       History reviewed. No pertinent past medical history.  History reviewed. No pertinent surgical history.  There were no vitals filed for this visit.  Subjective Assessment - 09/22/17 0852    Subjective  Patient reports very mild soreness in shoulder last session. 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     Diagnostic tests  06/26/17 R shoulder MRI: supraspinatus and infraspinatus small full-thickness tear. 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    Patient Stated Goals  move my arm and get back to work    Currently in Pain?  No/denies                       [facility] Adult PT Treatment/Exercise - 09/22/17 0001      Exercises   Exercises  Shoulder      Shoulder Exercises: Supine   External Rotation  Right;10 reps;AAROM    External Rotation Limitations  wand ER/IR to tolerance with dowel at elbow    Flexion  Right;AAROM;10 reps;Limitations    Flexion Limitations  wand to tolerance    ABduction  Right;10 reps;AAROM    ABduction Limitations  wand, sliding R arm along pillow on table; to tolerance      Shoulder Exercises: Prone   Other Prone Exercises  R shoulder prone row x10      Shoulder Exercises: Sidelying   ABduction  Right;AROM;10 reps    ABduction  Limitations  cues for controlled movement      Shoulder Exercises: Standing   Other Standing Exercises  R shoulder IR/ER isometric walkouts with yellow TB to tolerance 5x each direction      Vasopneumatic   Number Minutes Vasopneumatic   10 minutes    Vasopnuematic Location   Shoulder    Vasopneumatic Pressure  Low    Vasopneumatic Temperature   coldest temp.      Manual Therapy   Manual Therapy  Passive ROM;Soft tissue mobilization    Soft tissue mobilization  R UT- soft tissue restriction and tenderness here    Passive ROM  R shoulder PROM in all planes to tolerance; introduced abduction rather than scaption             PT Education - 09/22/17 1133    Education Details  HEP update    Person(s) Educated  Patient    Methods  Explanation;Demonstration;Tactile cues;Verbal cues;Handout    Comprehension  Returned demonstration;Verbalized understanding       PT Short Term Goals - 09/12/17 0901      PT SHORT TERM GOAL #1   Title  Patient to be independent with initial HEP.    Time  4  Period  Weeks    Status  Achieved        PT Long Term Goals - 09/12/17 0913      PT LONG TERM GOAL #1   Title  Patient to be independent with advanced HEP.    Time  8    Period  Weeks    Status  Partially Met   Met for current      PT LONG TERM GOAL #2   Title  Patient to demonstrate [facility] R shoulder AROM/PROM without pain limiting.     Time  8    Period  Weeks    Status  On-going      PT LONG TERM GOAL #3   Title  Patient to demonstrate R shoulder strength >=4+/5.    Time  8    Period  Weeks    Status  On-going      PT LONG TERM GOAL #4   Title  Patient to demonstrate reaching overhead to place 5# object on overhead cabinet with <=2/10 pain.    Time  8    Period  Weeks    Status  On-going      PT LONG TERM GOAL #5   Title  Patient to report tolerance of 1 day at work with <=2/10 pain.    Time  8    Period  Weeks    Status  On-going            Plan - 09/22/17  1134    Clinical Impression Statement  Patient arrived to session with report of mild soreness in R shoulder after last session that quickly dissipated. Patient with good tolerance of PROM this session- introduced abduction PROM rather than scaption. Patient moderately tender in R UT with STM, improved after manual therapy. Good carryover of AAROM exercises this date. Able to perform sidelying abduction with great eccentric control. Added this exercise to HEP and administered handout. Introduced isometric IR/ER walkouts with light resistance and cues to keep shoulder neutral- patient with good tolerance. Patient reported understanding .Patient with report of muscle soreness at end of session that was relieved by [facility].     PT Treatment/Interventions  ADLs/Self Care Home Management;Cryotherapy;Electrical Stimulation;Moist Heat;Ultrasound;Gait training;Stair training;Functional mobility training;Therapeutic activities;Therapeutic exercise;Manual techniques;Patient/family education;Scar mobilization;Passive range of motion;Dry needling;Energy conservation;Splinting;Taping;Vasopneumatic Device    Consulted and Agree with Plan of Care  Patient       Patient will benefit from skilled therapeutic intervention in order to improve the following deficits and impairments:  Decreased activity tolerance, Decreased strength, Impaired UE functional use, Pain, Decreased range of motion, Improper body mechanics, Postural dysfunction  Visit Diagnosis: Acute pain of right shoulder  Stiffness of right shoulder, not elsewhere classified  Muscle weakness (generalized)     Problem List There are no active problems to display for this patient.    Janene Harvey, PT, DPT 09/22/17 11:43 AM   [facility] [address]  [place], [place], [address] Phone: [phone]   Fax:  [phone]  Name: [name] [name] MRN: [id number] Date of Birth:  February 01, 1960

## 2017-09-26 ENCOUNTER — Ambulatory Visit: Payer: No Typology Code available for payment source | Attending: Orthopedic Surgery

## 2017-09-26 DIAGNOSIS — M25611 Stiffness of right shoulder, not elsewhere classified: Secondary | ICD-10-CM | POA: Diagnosis present

## 2017-09-26 DIAGNOSIS — M25511 Pain in right shoulder: Secondary | ICD-10-CM

## 2017-09-26 DIAGNOSIS — M6281 Muscle weakness (generalized): Secondary | ICD-10-CM | POA: Diagnosis present

## 2017-09-26 NOTE — Therapy (Signed)
[facility] [address]  [place], [place], [address] Phone: [phone]   Fax:  [phone]  Physical Therapy Treatment  Patient Details  Name: [name] [name] MRN: [id number] Date of Birth: 1960/12/20 Referring Provider: Tania Ade, MD   Encounter Date: 09/26/2017  PT End of Session - 09/26/17 0859    Visit Number  10    Number of Visits  17    Date for PT Re-Evaluation  10/10/17    Authorization Type  [employer]    PT Start Time  [phone]    PT Stop Time  [phone]   ended with 10 min moist heat    PT Time Calculation (min)  49 min    Activity Tolerance  Patient tolerated treatment well    Behavior During Therapy  [facility] for tasks assessed/performed       No past medical history on file.  No past surgical history on file.  There were no vitals filed for this visit.  Subjective Assessment - 09/26/17 0900    Subjective  Pt. noting she "slept on neck wrong", and has had neck pain this morning.      Diagnostic tests  06/26/17 R shoulder MRI: supraspinatus and infraspinatus small full-thickness tear. 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    Patient Stated Goals  move my arm and get back to work    Currently in Pain?  Yes    Pain Score  5     Pain Location  Neck    Pain Orientation  Right    Pain Descriptors / Indicators  --   "catching"   Pain Type  Acute pain    Pain Onset  Yesterday    Pain Frequency  Intermittent    Aggravating Factors   sleeping on neck wrong, turning head    Pain Relieving Factors  none     Multiple Pain Sites  No         [facility] PT Assessment - 09/26/17 0904      Assessment   Next MD Visit  10.2.19      AROM   Right/Left Shoulder  Right    Right Shoulder Flexion  64 Degrees    Right Shoulder ABduction  40 Degrees    Right Shoulder Internal Rotation  --   Not tested due to precautions    Right Shoulder External Rotation  --   FER to C7     PROM   PROM  Assessment Site  Shoulder    Right/Left Shoulder  Right    Right Shoulder Flexion  141 Degrees    Right Shoulder ABduction  111 Degrees    Right Shoulder Internal Rotation  85 Degrees    Right Shoulder External Rotation  56 Degrees                   [facility] Adult PT Treatment/Exercise - 09/26/17 0924      Shoulder Exercises: Supine   External Rotation  Right;10 reps;AAROM    External Rotation Limitations  wand ER/IR to tolerance with dowel at elbow   Some cueing required to maintain 90 dg elbow positioning    Flexion  Right;15 reps;AAROM    Flexion Limitations  wand to tolerance    ABduction  Right;AAROM;15 reps    ABduction Limitations  wand    Some cueing required for proper motion    Other Supine Exercises  R shoulder flexion AROM in scapular plane to tolerance x15  Shoulder Exercises: Prone   Retraction  Right;10 reps;Strengthening    Retraction Weight (lbs)  1    Other Prone Exercises  R shoulder prone row x 10   at 30dg abduction      Shoulder Exercises: Isometric Strengthening   External Rotation  --   10" x 10 reps    Internal Rotation  --   10" x 10 reps      Moist Heat Therapy   Number Minutes Moist Heat  10 Minutes    Moist Heat Location  Cervical      Manual Therapy   Manual Therapy  Soft tissue mobilization    Manual therapy comments  Hooklying    Soft tissue mobilization  R UT, Rhomboids, R cervical paraspinals STM in area of tenderness following by stretching       Neck Exercises: Stretches   Upper Trapezius Stretch  Right;2 reps;30 seconds    Levator Stretch  Right;2 reps;30 seconds   pt. reporting some relief following this stretch   Other Neck Stretches  Rhomboids stretch x 30 sec    pt. reporting some relief following this stretch              PT Short Term Goals - 09/12/17 0901      PT SHORT TERM GOAL #1   Title  Patient to be independent with initial HEP.    Time  4    Period  Weeks    Status  Achieved        PT  Long Term Goals - 09/26/17 0913      PT LONG TERM GOAL #1   Title  Patient to be independent with advanced HEP.    Time  8    Period  Weeks    Status  Partially Met   Met for current      PT LONG TERM GOAL #2   Title  Patient to demonstrate [facility] R shoulder AROM/PROM without pain limiting.     Time  8    Period  Weeks    Status  On-going   Pt. making significant progress with PROM      PT LONG TERM GOAL #3   Title  Patient to demonstrate R shoulder strength >=4+/5.    Time  8    Period  Weeks    Status  On-going   Not tested due to precautions      PT LONG TERM GOAL #4   Title  Patient to demonstrate reaching overhead to place 5# object on overhead cabinet with <=2/10 pain.    Time  8    Period  Weeks    Status  On-going   not tested due to precautions      PT LONG TERM GOAL #5   Title  Patient to report tolerance of 1 day at work with <=2/10 pain.    Time  8    Period  Weeks    Status  On-going            Plan - 09/26/17 6160    Clinical Impression Statement  Pt. has made good progress with therapy per protocol.  Tolerated mild progression of AAROM activities today per protocol and able to demo ~ 15-20 dg improvement in all R shoulder PROM measurements today.  MMT, 5# cabinet reach, and work related goal not addressed today due to current precautions.  Pt. progressing well.      PT Treatment/Interventions  ADLs/Self Care Home Management;Cryotherapy;Electrical Stimulation;Moist Heat;Ultrasound;Gait training;Stair  training;Functional mobility training;Therapeutic activities;Therapeutic exercise;Manual techniques;Patient/family education;Scar mobilization;Passive range of motion;Dry needling;Energy conservation;Splinting;Taping;Vasopneumatic Device    Consulted and Agree with Plan of Care  Patient       Patient will benefit from skilled therapeutic intervention in order to improve the following deficits and impairments:  Decreased activity tolerance, Decreased strength,  Impaired UE functional use, Pain, Decreased range of motion, Improper body mechanics, Postural dysfunction  Visit Diagnosis: Acute pain of right shoulder  Stiffness of right shoulder, not elsewhere classified  Muscle weakness (generalized)     Problem List There are no active problems to display for this patient.   Bess Harvest, PTA 09/26/17 11:54 AM   [facility] [address]  [place], [place], [address] Phone: [phone]   Fax:  [phone]  Name: Lolly Glaus MRN: [id number] Date of Birth: 07/01/1960

## 2017-09-29 ENCOUNTER — Ambulatory Visit: Payer: No Typology Code available for payment source

## 2017-09-29 DIAGNOSIS — M6281 Muscle weakness (generalized): Secondary | ICD-10-CM

## 2017-09-29 DIAGNOSIS — M25511 Pain in right shoulder: Secondary | ICD-10-CM

## 2017-09-29 DIAGNOSIS — M25611 Stiffness of right shoulder, not elsewhere classified: Secondary | ICD-10-CM

## 2017-09-29 NOTE — Therapy (Addendum)
[facility] [address]  [place], [place], [address] Phone: [phone]   Fax:  [phone]  Physical Therapy Treatment  Patient Details  Name: [name] [name] MRN: [id number] Date of Birth: [birth year] Referring Provider: Tania Ade, MD   Encounter Date: 09/29/2017  PT End of Session - 09/29/17 0854    Visit Number  11    Number of Visits  17    Date for PT Re-Evaluation  10/10/17    Authorization Type  [employer]    PT Start Time  [phone]    PT Stop Time  0940    PT Time Calculation (min)  51 min    Activity Tolerance  Patient tolerated treatment well    Behavior During Therapy  [facility] for tasks assessed/performed       No past medical history on file.  No past surgical history on file.  There were no vitals filed for this visit.  Subjective Assessment - 09/29/17 0853    Subjective  Pt. reporting some R neck stiffness today without known trigger.      Diagnostic tests  06/26/17 R shoulder MRI: supraspinatus and infraspinatus small full-thickness tear. 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    Patient Stated Goals  move my arm and get back to work    Currently in Pain?  No/denies    Pain Score  0-No pain    Multiple Pain Sites  No                       [facility] Adult PT Treatment/Exercise - 09/29/17 0902      Shoulder Exercises: Prone   Retraction  Right;15 reps    Retraction Weight (lbs)  2    Retraction Limitations  prone rowing with cues to stop at neutral and for scapular retraction     Extension  Right;10 reps;Weights;Strengthening    Extension Weight (lbs)  1    Extension Limitations  Cues for scapular retraction       Shoulder Exercises: Sidelying   ABduction  Right;15 reps;AROM      Shoulder Exercises: Standing   Flexion  Right;AAROM;10 reps    Flexion Limitations  wall ladder; Cues to drag R hand on ecc    ABduction  Right;10 reps;AAROM    ABduction  Limitations  scaption wall ladder; dragging hand on ladder on ecc      Shoulder Exercises: Pulleys   Flexion  3 minutes    Scaption  3 minutes      Manual Therapy   Manual Therapy  Soft tissue mobilization    Manual therapy comments  Hooklying    Passive ROM  R shoulder PROM in all planes to tolerance      Neck Exercises: Stretches   Upper Trapezius Stretch  Right;2 reps;30 seconds    Levator Stretch  Right;2 reps;30 seconds             PT Education - 09/29/17 1059    Education Details  HEP update     Person(s) Educated  Patient    Methods  Explanation;Demonstration;Verbal cues;Handout    Comprehension  Verbalized understanding;Returned demonstration;Verbal cues required;Need further instruction       PT Short Term Goals - 09/12/17 0901      PT SHORT TERM GOAL #1   Title  Patient to be independent with initial HEP.    Time  4    Period  Weeks    Status  Achieved  PT Long Term Goals - 09/26/17 0913      PT LONG TERM GOAL #1   Title  Patient to be independent with advanced HEP.    Time  8    Period  Weeks    Status  Partially Met   Met for current      PT LONG TERM GOAL #2   Title  Patient to demonstrate [facility] R shoulder AROM/PROM without pain limiting.     Time  8    Period  Weeks    Status  On-going   Pt. making significant progress with PROM      PT LONG TERM GOAL #3   Title  Patient to demonstrate R shoulder strength >=4+/5.    Time  8    Period  Weeks    Status  On-going   Not tested due to precautions      PT LONG TERM GOAL #4   Title  Patient to demonstrate reaching overhead to place 5# object on overhead cabinet with <=2/10 pain.    Time  8    Period  Weeks    Status  On-going   not tested due to precautions      PT LONG TERM GOAL #5   Title  Patient to report tolerance of 1 day at work with <=2/10 pain.    Time  8    Period  Weeks    Status  On-going            Plan - 09/29/17 0855    Clinical Impression Statement   [name] [name] doing well today reporting she worked most of the week without pain.  Tolerated progression of AAROM activities well today without issue.  Ended visit with ice/compression to R shoulder to decrease post-exercise soreness and pain.  Progressing well per protocol.      PT Treatment/Interventions  ADLs/Self Care Home Management;Cryotherapy;Electrical Stimulation;Moist Heat;Ultrasound;Gait training;Stair training;Functional mobility training;Therapeutic activities;Therapeutic exercise;Manual techniques;Patient/family education;Scar mobilization;Passive range of motion;Dry needling;Energy conservation;Splinting;Taping;Vasopneumatic Device    Consulted and Agree with Plan of Care  Patient       Patient will benefit from skilled therapeutic intervention in order to improve the following deficits and impairments:  Decreased activity tolerance, Decreased strength, Impaired UE functional use, Pain, Decreased range of motion, Improper body mechanics, Postural dysfunction  Visit Diagnosis: Acute pain of right shoulder  Stiffness of right shoulder, not elsewhere classified  Muscle weakness (generalized)     Problem List There are no active problems to display for this patient.   Bess Harvest, PTA 09/29/17 11:00 AM   [facility] [address]  [place], [place], [address] Phone: [phone]   Fax:  [phone]  Name: [name] [name] MRN: [id number] Date of Birth: 17-Jan-1961

## 2017-10-03 ENCOUNTER — Ambulatory Visit: Payer: No Typology Code available for payment source

## 2017-10-03 DIAGNOSIS — M25511 Pain in right shoulder: Secondary | ICD-10-CM | POA: Diagnosis not present

## 2017-10-03 DIAGNOSIS — M25611 Stiffness of right shoulder, not elsewhere classified: Secondary | ICD-10-CM

## 2017-10-03 DIAGNOSIS — M6281 Muscle weakness (generalized): Secondary | ICD-10-CM

## 2017-10-03 NOTE — Therapy (Signed)
[facility] [address]  [place], [place], [address] Phone: [phone]   Fax:  [phone]  Physical Therapy Treatment  Patient Details  Name: [name] [name] MRN: [id number] Date of Birth: 1960-06-21 Referring Provider: Tania Ade, MD   Encounter Date: 10/03/2017  PT End of Session - 10/03/17 0855    Visit Number  12    Number of Visits  17    Date for PT Re-Evaluation  10/10/17    Authorization Type  [employer]    PT Start Time  [phone]    PT Stop Time  0945    PT Time Calculation (min)  54 min    Activity Tolerance  Patient tolerated treatment well    Behavior During Therapy  [facility] for tasks assessed/performed       No past medical history on file.  No past surgical history on file.  There were no vitals filed for this visit.  Subjective Assessment - 10/03/17 1226    Subjective  Pt. doing well today noting she has been able to raise arm higher and feeling stronger over last few days.      Diagnostic tests  06/26/17 R shoulder MRI: supraspinatus and infraspinatus small full-thickness tear. 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    Patient Stated Goals  move my arm and get back to work    Currently in Pain?  No/denies    Pain Score  0-No pain    Multiple Pain Sites  No                       [facility] Adult PT Treatment/Exercise - 10/03/17 0905      Shoulder Exercises: Supine   Protraction  Right;15 reps;Weights    Protraction Weight (lbs)  1    Protraction Limitations  Cues for proper motion       Shoulder Exercises: Sidelying   External Rotation  Right;15 reps;AROM    External Rotation Limitations  Cues for scapular retraction    reported fatigue following    ABduction  Right;15 reps;AROM    ABduction Weight (lbs)  1       Shoulder Exercises: Standing   External Rotation  Right;10 reps;Strengthening;Theraband    Theraband Level (Shoulder External Rotation)   Level 1 (Yellow)    External Rotation Limitations  isometric band step outs in neutral     Internal Rotation  Right;10 reps;Theraband    Theraband Level (Shoulder Internal Rotation)  Level 1 (Yellow)    Internal Rotation Limitations  isometric band step outs in neutral     Flexion  Right;AAROM;10 reps    Flexion Limitations  orange p-ball roll up wall     Extension  Both;15 reps;Theraband;Strengthening    Theraband Level (Shoulder Extension)  Level 1 (Yellow)    Extension Limitations  Cues for scapular retraction     Row  15 reps;Theraband;Strengthening;Both    Theraband Level (Shoulder Row)  Level 2 (Red)    Row Limitations  with scapular retraction hold 5" hold       Shoulder Exercises: Pulleys   Flexion  3 minutes    Scaption  3 minutes   scaption/abduction      Vasopneumatic   Number Minutes Vasopneumatic   10 minutes    Vasopnuematic Location   Shoulder    Vasopneumatic Pressure  Low    Vasopneumatic Temperature   coldest temp.      Manual Therapy   Manual Therapy  Soft tissue mobilization;Passive ROM    Manual therapy comments  Hooklying    Passive ROM  R shoulder PROM in all planes to tolerance               PT Short Term Goals - 09/12/17 0901      PT SHORT TERM GOAL #1   Title  Patient to be independent with initial HEP.    Time  4    Period  Weeks    Status  Achieved        PT Long Term Goals - 09/26/17 0913      PT LONG TERM GOAL #1   Title  Patient to be independent with advanced HEP.    Time  8    Period  Weeks    Status  Partially Met   Met for current      PT LONG TERM GOAL #2   Title  Patient to demonstrate [facility] R shoulder AROM/PROM without pain limiting.     Time  8    Period  Weeks    Status  On-going   Pt. making significant progress with PROM      PT LONG TERM GOAL #3   Title  Patient to demonstrate R shoulder strength >=4+/5.    Time  8    Period  Weeks    Status  On-going   Not tested due to precautions      PT LONG TERM  GOAL #4   Title  Patient to demonstrate reaching overhead to place 5# object on overhead cabinet with <=2/10 pain.    Time  8    Period  Weeks    Status  On-going   not tested due to precautions      PT LONG TERM GOAL #5   Title  Patient to report tolerance of 1 day at work with <=2/10 pain.    Time  8    Period  Weeks    Status  On-going            Plan - 10/03/17 1227    Clinical Impression Statement  Pt. reporting improved ability to raise arm over shoulder height over last few days.  Tolerated progression of RTC isometrics and progression of scapular strengthening activities well today per protocol.  Noted some R shoulder soreness to end visit thus ended with ice/compression to R shoulder with good resolution of soreness following this.  Will continue to progress toward goals.      PT Treatment/Interventions  ADLs/Self Care Home Management;Cryotherapy;Electrical Stimulation;Moist Heat;Ultrasound;Gait training;Stair training;Functional mobility training;Therapeutic activities;Therapeutic exercise;Manual techniques;Patient/family education;Scar mobilization;Passive range of motion;Dry needling;Energy conservation;Splinting;Taping;Vasopneumatic Device    Consulted and Agree with Plan of Care  Patient       Patient will benefit from skilled therapeutic intervention in order to improve the following deficits and impairments:  Decreased activity tolerance, Decreased strength, Impaired UE functional use, Pain, Decreased range of motion, Improper body mechanics, Postural dysfunction  Visit Diagnosis: Acute pain of right shoulder  Stiffness of right shoulder, not elsewhere classified  Muscle weakness (generalized)     Problem List There are no active problems to display for this patient.   Bess Harvest, PTA 10/03/17 12:31 PM   [facility] [address]  [place], [place], [address] Phone: [phone]   Fax:   [phone]  Name: [name] [name] MRN: [id number] Date of Birth: Mar 16, 1960

## 2017-10-06 ENCOUNTER — Ambulatory Visit: Payer: No Typology Code available for payment source | Admitting: Physical Therapy

## 2017-10-06 ENCOUNTER — Encounter: Payer: Self-pay | Admitting: Physical Therapy

## 2017-10-06 DIAGNOSIS — M25511 Pain in right shoulder: Secondary | ICD-10-CM

## 2017-10-06 DIAGNOSIS — M25611 Stiffness of right shoulder, not elsewhere classified: Secondary | ICD-10-CM

## 2017-10-06 DIAGNOSIS — M6281 Muscle weakness (generalized): Secondary | ICD-10-CM

## 2017-10-06 NOTE — Therapy (Signed)
[facility] [address]  [place], [place], [address] Phone: [phone]   Fax:  [phone]  Physical Therapy Treatment  Patient Details  Name: [name] [name] MRN: [id number] Date of Birth: 1960-10-08 Referring Provider: Tania Ade, MD   Encounter Date: 10/06/2017  PT End of Session - 10/06/17 0931    Visit Number  13    Number of Visits  17    Date for PT Re-Evaluation  11/03/17    Authorization Type  [employer]    PT Start Time  [phone]   patient arrived late   PT Stop Time  0928    PT Time Calculation (min)  34 min    Activity Tolerance  Patient tolerated treatment well    Behavior During Therapy  [facility] for tasks assessed/performed       History reviewed. No pertinent past medical history.  History reviewed. No pertinent surgical history.  There were no vitals filed for this visit.  Subjective Assessment - 10/06/17 0855    Subjective  Reports she didn't sleep well last night. Shoulder has been really good. Reports 80% improvement since inital eval. Notes improvements in reaching with R shoudler and able to do more with her arm. Easier to drive but still having trouble with overhead reaching.     Diagnostic tests  06/26/17 R shoulder MRI: supraspinatus and infraspinatus small full-thickness tear. 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    Patient Stated Goals  move my arm and get back to work    Currently in Pain?  No/denies         [facility] PT Assessment - 10/06/17 0001      Assessment   Medical Diagnosis  R RTC Repair    Referring Provider  Tania Ade, MD    Onset Date/Surgical Date  07/31/17      AROM   Right/Left Shoulder  Right    Right Shoulder Flexion  129 Degrees    Right Shoulder ABduction  95 Degrees    Right Shoulder Internal Rotation  --   NT   Right Shoulder External Rotation  --   FER C8     PROM   PROM Assessment Site  Shoulder    Right/Left Shoulder   Right    Right Shoulder Flexion  145 Degrees    Right Shoulder ABduction  153 Degrees    Right Shoulder Internal Rotation  90 Degrees    Right Shoulder External Rotation  80 Degrees      Strength   Strength Assessment Site  Shoulder    Right/Left Shoulder  Right    Right Shoulder Flexion  4/5    Right Shoulder ABduction  4/5    Right Shoulder Internal Rotation  4/5    Right Shoulder External Rotation  4-/5                   [facility] Adult PT Treatment/Exercise - 10/06/17 0001      Shoulder Exercises: Supine   Protraction  Both;10 reps;Weights;Limitations    Protraction Weight (lbs)  2    Protraction Limitations  good technique    Flexion  Strengthening;AROM;Right;5 reps;Weights;Limitations    Shoulder Flexion Weight (lbs)  0, 1    Flexion Limitations  5x 0#, 5x 1#      Shoulder Exercises: Sidelying   External Rotation  Right;Strengthening;10 reps;Weights;Limitations    External Rotation Weight (lbs)  1    External Rotation Limitations  2x10; dowel under elbow for  neutral shoulder    ABduction  Right;10 reps;Weights;Limitations    ABduction Weight (lbs)  1    ABduction Limitations  cues to slow down on eccentric phase      Shoulder Exercises: Standing   External Rotation  Right;10 reps;Strengthening;Theraband    Theraband Level (Shoulder External Rotation)  Level 2 (Red)    External Rotation Limitations  dowel under elbow    Internal Rotation  Right;10 reps;Theraband    Theraband Level (Shoulder Internal Rotation)  Level 2 (Red)    Internal Rotation Limitations  dowel under elbow      Shoulder Exercises: Pulleys   Flexion  3 minutes    Scaption  3 minutes   scaption/abduction              PT Short Term Goals - 10/06/17 0912      PT SHORT TERM GOAL #1   Title  Patient to be independent with initial HEP.    Time  4    Period  Weeks    Status  Achieved        PT Long Term Goals - 10/06/17 0912      PT LONG TERM GOAL #1   Title  Patient to be  independent with advanced HEP.    Time  4    Period  Weeks    Status  On-going   80% consistency with HEP   Target Date  11/03/17      PT LONG TERM GOAL #2   Title  Patient to demonstrate [facility] R shoulder AROM/PROM without pain limiting.     Time  4    Period  Weeks    Status  On-going   R shoulder AROM and PROM improved in all planes   Target Date  11/03/17      PT LONG TERM GOAL #3   Title  Patient to demonstrate R shoulder strength >=4+/5.    Time  4    Period  Weeks    Status  On-going   showing progress, see objective measures   Target Date  11/03/17      PT LONG TERM GOAL #4   Title  Patient to demonstrate reaching overhead to place 5# object on overhead cabinet with <=2/10 pain.    Time  4    Period  Weeks    Status  On-going   able to raise R UE overhead with 1# and minimal compensations   Target Date  11/03/17      PT LONG TERM GOAL #5   Title  Patient to report tolerance of 1 day at work with <=2/10 pain.    Time  4    Period  Weeks    Status  On-going   still on limited duty at work   Target Date  11/03/17            Plan - 10/06/17 1135    Clinical Impression Statement  Patient arrived to session with no new complaints. Reports 80% improvement since initial eval, citing improvements in ability to reach and drive, still having issues with overhead reaching. Updated goals- patient showing significant improvements in all planes of R shoulder AROM and PROM. Able to tolerate resistive testing, with most weakness in ER. Able to reach to overhead cabinet with 1# and minimal compensations. Patient is on light duty at work and not having any issue thus far. Patient able to tolerate progressive RTC strengthening with intermittent cues required to correct form. Able to tolerate increased  weight resistance with ER and supine flexion today. Patient showing great progress with PT thus far, will continue to benefit from skilled PT services 1x/week for 4 weeks to address  remaining strength deficits as patient has a very active job.     PT Frequency  1x / week    PT Duration  4 weeks    PT Treatment/Interventions  ADLs/Self Care Home Management;Cryotherapy;Electrical Stimulation;Moist Heat;Ultrasound;Gait training;Stair training;Functional mobility training;Therapeutic activities;Therapeutic exercise;Manual techniques;Patient/family education;Scar mobilization;Passive range of motion;Dry needling;Energy conservation;Splinting;Taping;Vasopneumatic Device    Consulted and Agree with Plan of Care  Patient       Patient will benefit from skilled therapeutic intervention in order to improve the following deficits and impairments:  Decreased activity tolerance, Decreased strength, Impaired UE functional use, Pain, Decreased range of motion, Improper body mechanics, Postural dysfunction  Visit Diagnosis: Acute pain of right shoulder  Stiffness of right shoulder, not elsewhere classified  Muscle weakness (generalized)     Problem List There are no active problems to display for this patient.   Janene Harvey, PT, DPT 10/06/17 11:37 AM   [facility] [address]  [place], [place], [address] Phone: [phone]   Fax:  [phone]  Name: Candid Bovey MRN: [id number] Date of Birth: 08-10-1960

## 2017-10-10 ENCOUNTER — Ambulatory Visit: Payer: No Typology Code available for payment source

## 2017-10-10 DIAGNOSIS — M25511 Pain in right shoulder: Secondary | ICD-10-CM

## 2017-10-10 DIAGNOSIS — M6281 Muscle weakness (generalized): Secondary | ICD-10-CM

## 2017-10-10 DIAGNOSIS — M25611 Stiffness of right shoulder, not elsewhere classified: Secondary | ICD-10-CM

## 2017-10-10 NOTE — Therapy (Signed)
[facility] [address]  [place], [place], [address] Phone: [phone]   Fax:  [phone]  Physical Therapy Treatment  Patient Details  Name: Marlee Armenteros MRN: [id number] Date of Birth: 03-06-1960 Referring Provider: Tania Ade, MD   Encounter Date: 10/10/2017  PT End of Session - 10/10/17 0856    Visit Number  14    Number of Visits  17    Date for PT Re-Evaluation  11/03/17    Authorization Type  [employer]    PT Start Time  [phone]    PT Stop Time  0927    PT Time Calculation (min)  40 min    Activity Tolerance  Patient tolerated treatment well    Behavior During Therapy  [facility] for tasks assessed/performed       No past medical history on file.  No past surgical history on file.  There were no vitals filed for this visit.  Subjective Assessment - 10/10/17 0912    Subjective  Pt. doing well today.  Feels her strength is improving with therapy.      Diagnostic tests  06/26/17 R shoulder MRI: supraspinatus and infraspinatus small full-thickness tear. 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    Patient Stated Goals  move my arm and get back to work    Currently in Pain?  No/denies    Pain Score  0-No pain    Multiple Pain Sites  No                       [facility] Adult PT Treatment/Exercise - 10/10/17 0901      Shoulder Exercises: Supine   Protraction  Both;10 reps;Weights;Limitations    Protraction Weight (lbs)  3    Protraction Limitations  good technique      Shoulder Exercises: Prone   Retraction  15 reps;Both    Retraction Limitations  Prone "I's" over green p-ball     Flexion  10 reps;Both    Flexion Limitations  Prone "Y's" over green p-ball     Extension  Both;10 reps    Extension Limitations  Prone "T's" over green p-ball       Shoulder Exercises: Standing   External Rotation  Right;12 reps;Theraband;Strengthening    Theraband Level (Shoulder External  Rotation)  Level 2 (Red)    External Rotation Limitations  Cues to prevent trunk rotation and scap. elevation     Internal Rotation  Right;12 reps    Theraband Level (Shoulder Internal Rotation)  Level 2 (Red)    Internal Rotation Limitations  dowel under elbow      Shoulder Exercises: Pulleys   Flexion  3 minutes    ABduction  3 minutes    ABduction Limitations  with slight scaption       Shoulder Exercises: ROM/Strengthening   Lat Pull  15 reps    Lat Pull Limitations  15#; cues for scapular retraction/depression     Cybex Row  10 reps    Cybex Row Limitations  15#; low handles      Shoulder Exercises: Stretch   Other Shoulder Stretches  Rhomboids stretch x 30 sec              PT Education - 10/10/17 1046    Education Details  HEP update    Person(s) Educated  Patient    Methods  Explanation;Demonstration;Verbal cues;Handout    Comprehension  Verbalized understanding;Returned demonstration;Verbal cues required;Need further instruction  PT Short Term Goals - 10/06/17 0912      PT SHORT TERM GOAL #1   Title  Patient to be independent with initial HEP.    Time  4    Period  Weeks    Status  Achieved        PT Long Term Goals - 10/06/17 0912      PT LONG TERM GOAL #1   Title  Patient to be independent with advanced HEP.    Time  4    Period  Weeks    Status  On-going   80% consistency with HEP   Target Date  11/03/17      PT LONG TERM GOAL #2   Title  Patient to demonstrate [facility] R shoulder AROM/PROM without pain limiting.     Time  4    Period  Weeks    Status  On-going   R shoulder AROM and PROM improved in all planes   Target Date  11/03/17      PT LONG TERM GOAL #3   Title  Patient to demonstrate R shoulder strength >=4+/5.    Time  4    Period  Weeks    Status  On-going   showing progress, see objective measures   Target Date  11/03/17      PT LONG TERM GOAL #4   Title  Patient to demonstrate reaching overhead to place 5# object on  overhead cabinet with <=2/10 pain.    Time  4    Period  Weeks    Status  On-going   able to raise R UE overhead with 1# and minimal compensations   Target Date  11/03/17      PT LONG TERM GOAL #5   Title  Patient to report tolerance of 1 day at work with <=2/10 pain.    Time  4    Period  Weeks    Status  On-going   still on limited duty at work   Target Date  11/03/17            Plan - 10/10/17 0857    Clinical Impression Statement  Kaho reporting improved strength with overhead motion now.  Tolerated progression of scapular and RTC strengthening activities well today without pain.  Scapulohumeral rhythm is improving with elevation activities.  Ended visit pain free thus modalities deferred.  HEP updated.      PT Treatment/Interventions  ADLs/Self Care Home Management;Cryotherapy;Electrical Stimulation;Moist Heat;Ultrasound;Gait training;Stair training;Functional mobility training;Therapeutic activities;Therapeutic exercise;Manual techniques;Patient/family education;Scar mobilization;Passive range of motion;Dry needling;Energy conservation;Splinting;Taping;Vasopneumatic Device    Consulted and Agree with Plan of Care  Patient       Patient will benefit from skilled therapeutic intervention in order to improve the following deficits and impairments:  Decreased activity tolerance, Decreased strength, Impaired UE functional use, Pain, Decreased range of motion, Improper body mechanics, Postural dysfunction  Visit Diagnosis: Acute pain of right shoulder  Stiffness of right shoulder, not elsewhere classified  Muscle weakness (generalized)     Problem List There are no active problems to display for this patient.   Bess Harvest, PTA 10/10/17 12:45 PM   [facility] [address]  [place], [place], [address] Phone: [phone]   Fax:  [phone]  Name: Lurleen Soltero MRN: [id number] Date of Birth:  1960-08-14

## 2017-10-13 ENCOUNTER — Encounter: Payer: PRIVATE HEALTH INSURANCE | Admitting: Physical Therapy

## 2017-10-17 ENCOUNTER — Encounter: Payer: Self-pay | Admitting: Physical Therapy

## 2017-10-17 ENCOUNTER — Ambulatory Visit: Payer: No Typology Code available for payment source | Admitting: Physical Therapy

## 2017-10-17 DIAGNOSIS — M25611 Stiffness of right shoulder, not elsewhere classified: Secondary | ICD-10-CM

## 2017-10-17 DIAGNOSIS — M25511 Pain in right shoulder: Secondary | ICD-10-CM

## 2017-10-17 DIAGNOSIS — M6281 Muscle weakness (generalized): Secondary | ICD-10-CM

## 2017-10-17 NOTE — Therapy (Signed)
[facility] [address]  [place], [place], [address] Phone: [phone]   Fax:  [phone]  Physical Therapy Treatment  Patient Details  Name: [name] [name] MRN: [id number] Date of Birth: Jan 02, 1961 Referring Provider: Tania Ade, MD   Encounter Date: 10/17/2017  PT End of Session - 10/17/17 0936    Visit Number  15    Number of Visits  17    Date for PT Re-Evaluation  11/03/17    Authorization Type  [employer]    PT Start Time  [phone]   patient arrived late   PT Stop Time  0931    PT Time Calculation (min)  38 min    Activity Tolerance  Patient tolerated treatment well    Behavior During Therapy  [facility] for tasks assessed/performed       History reviewed. No pertinent past medical history.  History reviewed. No pertinent surgical history.  There were no vitals filed for this visit.  Subjective Assessment - 10/17/17 0853    Subjective  Reports the R shoulder is getting stronger and doing more things with it.     Diagnostic tests  06/26/17 R shoulder MRI: supraspinatus and infraspinatus small full-thickness tear. 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    Patient Stated Goals  move my arm and get back to work    Currently in Pain?  No/denies                       [facility] Adult PT Treatment/Exercise - 10/17/17 0001      Exercises   Exercises  Shoulder      Shoulder Exercises: Supine   Protraction  Both;10 reps;Weights;Limitations    Protraction Weight (lbs)  4    Protraction Limitations  2x10; cues to maintain straight elbows      Shoulder Exercises: Seated   Other Seated Exercises  overhead rhythmic stabilization with yellow medball B UEs 2x30"      Shoulder Exercises: Prone   Retraction Limitations  Prone "I's" over green p-ball     Flexion  10 reps;Both;Right;Strengthening    Flexion Limitations  Prone "Y's" over green p-ball     Extension  Both;10  reps;Right;Strengthening    Extension Limitations  Prone "T's" over green p-ball 10x; cues for scap retraction      Shoulder Exercises: Sidelying   External Rotation  Right;Strengthening;10 reps;Weights;Limitations    External Rotation Weight (lbs)  2    External Rotation Limitations  2x10; dowel under elbow for neutral shoulder      Shoulder Exercises: Standing   Horizontal ABduction  Strengthening;Both;10 reps;Theraband;Limitations    Theraband Level (Shoulder Horizontal ABduction)  Level 2 (Red)    Horizontal ABduction Limitations  2x10; cues for scap retraction    External Rotation  Right;Theraband;Strengthening;10 reps    Theraband Level (Shoulder External Rotation)  Level 2 (Red)    External Rotation Limitations  dowel under elbow; cues to avoid trunk lean    Internal Rotation  Strengthening;Right;10 reps;Theraband;Limitations    Theraband Level (Shoulder Internal Rotation)  Level 2 (Red)    Internal Rotation Limitations  dowel under elbow    Row  15 reps;Theraband;Strengthening;Both    Theraband Level (Shoulder Row)  Level 3 (Green)    Row Limitations  2x15; good form      Shoulder Exercises: Pulleys   Flexion  3 minutes    Scaption  3 minutes      Shoulder Exercises: ROM/Strengthening  Lat Pull  10 reps    Lat Pull Limitations  20#; cues for scapular retraction/depression       Shoulder Exercises: Stretch   Corner Stretch  2 reps;30 seconds;Limitations    Corner Stretch Limitations  R UE doorway 90/90 stretch to tol    Cross Chest Stretch  2 reps;30 seconds;Limitations    Cross Chest Stretch Limitations  R UE to tol      Manual Therapy   Manual Therapy  Passive ROM    Passive ROM  R shoulder PROM in all planes to tolerance   good ROM              PT Short Term Goals - 10/06/17 0912      PT SHORT TERM GOAL #1   Title  Patient to be independent with initial HEP.    Time  4    Period  Weeks    Status  Achieved        PT Long Term Goals - 10/06/17  0912      PT LONG TERM GOAL #1   Title  Patient to be independent with advanced HEP.    Time  4    Period  Weeks    Status  On-going   80% consistency with HEP   Target Date  11/03/17      PT LONG TERM GOAL #2   Title  Patient to demonstrate [facility] R shoulder AROM/PROM without pain limiting.     Time  4    Period  Weeks    Status  On-going   R shoulder AROM and PROM improved in all planes   Target Date  11/03/17      PT LONG TERM GOAL #3   Title  Patient to demonstrate R shoulder strength >=4+/5.    Time  4    Period  Weeks    Status  On-going   showing progress, see objective measures   Target Date  11/03/17      PT LONG TERM GOAL #4   Title  Patient to demonstrate reaching overhead to place 5# object on overhead cabinet with <=2/10 pain.    Time  4    Period  Weeks    Status  On-going   able to raise R UE overhead with 1# and minimal compensations   Target Date  11/03/17      PT LONG TERM GOAL #5   Title  Patient to report tolerance of 1 day at work with <=2/10 pain.    Time  4    Period  Weeks    Status  On-going   still on limited duty at work   Target Date  11/03/17            Plan - 10/17/17 0936    Clinical Impression Statement  Patient arrived to session with no new complaints. Tolerated R shoulder PROM without c/o pain at end ranges. Progressed sidelying ER with increased weight and good form. Form corrected and scapular retraction encouraged for prone I, T, Y's with good carryover. Ended session with R UE stretching to tolerance. Patient with question about behind the back reaching- advised patient to avoid behind the back motions until 12 weeks post-op. Patient reporting noncompliance with HEP- advised patient that if she continues to perform rigorous UE activities at work without performing strengthening HEP she is at increased risk of injury. Patient reported understanding of importance of HEP compliance. No c/o pain at end of session.  PT  Treatment/Interventions  ADLs/Self Care Home Management;Cryotherapy;Electrical Stimulation;Moist Heat;Ultrasound;Gait training;Stair training;Functional mobility training;Therapeutic activities;Therapeutic exercise;Manual techniques;Patient/family education;Scar mobilization;Passive range of motion;Dry needling;Energy conservation;Splinting;Taping;Vasopneumatic Device    Consulted and Agree with Plan of Care  Patient       Patient will benefit from skilled therapeutic intervention in order to improve the following deficits and impairments:  Decreased activity tolerance, Decreased strength, Impaired UE functional use, Pain, Decreased range of motion, Improper body mechanics, Postural dysfunction  Visit Diagnosis: Acute pain of right shoulder  Stiffness of right shoulder, not elsewhere classified  Muscle weakness (generalized)     Problem List There are no active problems to display for this patient.    Janene Harvey, PT, DPT 10/17/17 9:38 AM   [facility] [address]  [place], [place], [address] Phone: [phone]   Fax:  [phone]  Name: [name] [name] MRN: [id number] Date of Birth: [birth year]

## 2017-10-24 ENCOUNTER — Ambulatory Visit: Payer: No Typology Code available for payment source | Attending: Orthopedic Surgery | Admitting: Physical Therapy

## 2017-10-24 ENCOUNTER — Encounter: Payer: Self-pay | Admitting: Physical Therapy

## 2017-10-24 DIAGNOSIS — M25611 Stiffness of right shoulder, not elsewhere classified: Secondary | ICD-10-CM | POA: Insufficient documentation

## 2017-10-24 DIAGNOSIS — M6281 Muscle weakness (generalized): Secondary | ICD-10-CM | POA: Diagnosis present

## 2017-10-24 DIAGNOSIS — M25511 Pain in right shoulder: Secondary | ICD-10-CM | POA: Diagnosis not present

## 2017-10-24 NOTE — Therapy (Addendum)
[facility] [address]  [place], [place], [address] Phone: [phone]   Fax:  [phone]  Physical Therapy Treatment  Patient Details  Name: [name] [name] MRN: [id number] Date of Birth: 01/01/1961 Referring Provider (PT): Tania Ade, MD   Encounter Date: 10/24/2017  PT End of Session - 10/24/17 1021    Visit Number  16    Number of Visits  17    Date for PT Re-Evaluation  11/03/17    Authorization Type  [employer] Gemma Rule    PT Start Time  [phone]   patient arrived late   PT Stop Time  0932    PT Time Calculation (min)  34 min    Activity Tolerance  Patient tolerated treatment well    Behavior During Therapy  [facility] for tasks assessed/performed       History reviewed. No pertinent past medical history.  History reviewed. No pertinent surgical history.  There were no vitals filed for this visit.  Subjective Assessment - 10/24/17 0859    Subjective  Patient reports that she is feeling good and ready to transition to a home program. Reports 95% improvement since initial eval. Reports improvements in everything- moving arm, less pain, daily activities.     Diagnostic tests  06/26/17 R shoulder MRI: supraspinatus and infraspinatus small full-thickness tear. 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    Patient Stated Goals  move my arm and get back to work    Currently in Pain?  No/denies         [facility] PT Assessment - 10/24/17 0001      AROM   Right/Left Shoulder  Right    Right Shoulder Flexion  148 Degrees    Right Shoulder ABduction  160 Degrees    Right Shoulder Internal Rotation  --   FIR T7   Right Shoulder External Rotation  --   FER T1     PROM   PROM Assessment Site  Shoulder    Right/Left Shoulder  Right    Right Shoulder Flexion  160 Degrees   mild pain   Right Shoulder ABduction  180 Degrees    Right Shoulder Internal Rotation  86 Degrees    Right Shoulder External  Rotation  80 Degrees      Strength   Strength Assessment Site  Shoulder    Right/Left Shoulder  Right    Right Shoulder Flexion  4+/5    Right Shoulder ABduction  4+/5    Right Shoulder Internal Rotation  4+/5    Right Shoulder External Rotation  4/5                   [facility] Adult PT Treatment/Exercise - 10/24/17 0001      Shoulder Exercises: Prone   Retraction Limitations  Prone "I's" over orange p-ball     Flexion  10 reps;Both;Right;Strengthening   10x 0#; 10x 1#   Flexion Limitations  Prone "Y's" over orange p-ball     Extension  Both;10 reps;Right;Strengthening    Extension Limitations  Prone "T's" over orange pball 10x      Shoulder Exercises: Standing   External Rotation  Right;Theraband;Strengthening;10 reps    Theraband Level (Shoulder External Rotation)  Level 2 (Red)    External Rotation Limitations  dowel under elbow; cues to step out to increase resistance    Internal Rotation  Strengthening;Right;10 reps;Theraband;Limitations    Theraband Level (Shoulder Internal Rotation)  Level 2 (Red)    Internal Rotation  Limitations  dowel under elbow    Flexion  Right;Strengthening;5 reps;Limitations    Flexion Limitations  overhead reach to cabinet 5# x5    Row  Theraband;Strengthening;Both;10 reps    Theraband Level (Shoulder Row)  Level 3 (Green)    Row Limitations  10x    Other Standing Exercises  R shoulder flexion with red TB x 10   cues for slow eccentric lower   Other Standing Exercises  wall push up plus x 10   cues to keep elbows straight     Shoulder Exercises: Pulleys   Flexion  3 minutes    Scaption  3 minutes             PT Education - 10/24/17 1020    Education Details  update and consolidation of HEP; administered green TB    Person(s) Educated  Patient    Methods  Explanation;Demonstration;Tactile cues;Verbal cues;Handout    Comprehension  Verbalized understanding;Returned demonstration       PT Short Term Goals - 10/24/17 0903       PT SHORT TERM GOAL #1   Title  Patient to be independent with initial HEP.    Time  4    Period  Weeks    Status  Achieved        PT Long Term Goals - 10/24/17 2641      PT LONG TERM GOAL #1   Title  Patient to be independent with advanced HEP.    Time  4    Period  Weeks    Status  Partially Met   70% consistency with HEP     PT LONG TERM GOAL #2   Title  Patient to demonstrate [facility] R shoulder AROM/PROM without pain limiting.     Time  4    Period  Weeks    Status  Partially Met   excellent R shoulder AROM and PROM; mild pain at end range R shoulder flexion PROM     PT LONG TERM GOAL #3   Title  Patient to demonstrate R shoulder strength >=4+/5.    Time  4    Period  Weeks    Status  Partially Met   R shoulder ER 4/5     PT LONG TERM GOAL #4   Title  Patient to demonstrate reaching overhead to place 5# object on overhead cabinet with <=2/10 pain.    Time  4    Period  Weeks    Status  Achieved   able to perform witih 5# and no compensations     PT LONG TERM GOAL #5   Title  Patient to report tolerance of 1 day at work with <=2/10 pain.    Time  4    Period  Weeks    Status  Partially Met   reports no pain at work, however not back to all activities at this time           Plan - 10/24/17 1021    Clinical Impression Statement  Patient arrived to session late with report of 95% improvement in R shoulder since initial eval. Reports improvements in pain levels, ability to move R shoulder, and functional activity tolerance. Patient believes she is ready for transition to HEP at this time pending MD appointment tomorrow. Updated goals- patient has shown improvements in all planes of R shoulder AROM, flexion and abduction PROM, and all planes of strength testing. Also able to reach overhead to cabinet with 5lbs without compensations. Patient  reports she is still on limited duty at work per MD's surgical precautions at this time. Patient tolerated appointment  without issues today. Reviewed and consolidated HEP with focus on strengthening and provided patient with green resistance band. Patient reported understanding of all education provided. Patient to be placed on 30 day hold at this time d/t satisfaction with CLOF.     PT Treatment/Interventions  ADLs/Self Care Home Management;Cryotherapy;Electrical Stimulation;Moist Heat;Ultrasound;Gait training;Stair training;Functional mobility training;Therapeutic activities;Therapeutic exercise;Manual techniques;Patient/family education;Scar mobilization;Passive range of motion;Dry needling;Energy conservation;Splinting;Taping;Vasopneumatic Device    PT Next Visit Plan  30 day hold at this time    Consulted and Agree with Plan of Care  Patient       Patient will benefit from skilled therapeutic intervention in order to improve the following deficits and impairments:  Decreased activity tolerance, Decreased strength, Impaired UE functional use, Pain, Decreased range of motion, Improper body mechanics, Postural dysfunction  Visit Diagnosis: Acute pain of right shoulder  Stiffness of right shoulder, not elsewhere classified  Muscle weakness (generalized)     Problem List There are no active problems to display for this patient.    Janene Harvey, PT, DPT 10/24/17 10:38 AM   [facility] [address]  [place], [place], [address] Phone: [phone]   Fax:  [phone]  Name: Amoria Mclees MRN: [id number] Date of Birth: 1960/04/20  PHYSICAL THERAPY DISCHARGE SUMMARY  Visits from Start of Care: 16  Current functional level related to goals / functional outcomes: See above clinical impression   Remaining deficits: Decreased ROM, decreased strength, return to work    Education / Equipment: HEP  Plan: Patient agrees to discharge.  Patient goals were partially met. Patient is being discharged due to being pleased with the  current functional level.  ?????     Janene Harvey, PT, DPT 11/27/17 8:57 AM

## 2018-11-13 ENCOUNTER — Telehealth: Payer: Self-pay

## 2018-11-13 NOTE — Telephone Encounter (Signed)
NOTES ON FILE FROM DR [facility] [phone], SENT REFERRAL TO SCHEDULING

## 2018-12-03 ENCOUNTER — Ambulatory Visit (INDEPENDENT_AMBULATORY_CARE_PROVIDER_SITE_OTHER): Payer: No Typology Code available for payment source

## 2018-12-03 ENCOUNTER — Ambulatory Visit (INDEPENDENT_AMBULATORY_CARE_PROVIDER_SITE_OTHER): Payer: No Typology Code available for payment source | Admitting: Cardiology

## 2018-12-03 ENCOUNTER — Other Ambulatory Visit: Payer: Self-pay

## 2018-12-03 ENCOUNTER — Encounter: Payer: Self-pay | Admitting: Cardiology

## 2018-12-03 ENCOUNTER — Other Ambulatory Visit: Payer: Self-pay | Admitting: *Deleted

## 2018-12-03 VITALS — BP 122/78 | HR 75 | Ht 62.5 in | Wt 153.0 lb

## 2018-12-03 DIAGNOSIS — R002 Palpitations: Secondary | ICD-10-CM

## 2018-12-03 DIAGNOSIS — I498 Other specified cardiac arrhythmias: Secondary | ICD-10-CM | POA: Diagnosis not present

## 2018-12-03 DIAGNOSIS — Z13220 Encounter for screening for lipoid disorders: Secondary | ICD-10-CM

## 2018-12-03 NOTE — Patient Instructions (Signed)
Medication Instructions:  Your physician recommends that you continue on your current medications as directed. Please refer to the Current Medication list given to you today.  *If you need a refill on your cardiac medications before your next appointment, please call your pharmacy*  Lab Work: Your physician recommends that you return for lab work in: TODAY BMP,CBC,TSH,Magnesium  2 Months prior to appointment: Lipid  If you have labs (blood work) drawn today and your tests are completely normal, you will receive your results only by: Marland Kitchen MyChart Message (if you have MyChart) OR . A paper copy in the mail If you have any lab test that is abnormal or we need to change your treatment, we will call you to review the results.  Testing/Procedures: Your physician has requested that you have an echocardiogram. Echocardiography is a painless test that uses sound waves to create images of your heart. It provides your doctor with information about the size and shape of your heart and how well your heart's chambers and valves are working. This procedure takes approximately one hour. There are no restrictions for this procedure.  A zio monitor was placed today. It will remain on for 7 days. You will then return monitor and event diary in provided box. It takes 1-2 weeks for report to be downloaded and returned to [place]. We will call you with the results. If monitor falls off or has orange flashing light, please call Zio for further instructions.     Follow-Up: At [facility], you and your health needs are our priority.  As part of our continuing mission to provide you with exceptional heart care, we have created designated Provider Care Teams.  These Care Teams include your primary Cardiologist (physician) and Advanced Practice Providers (APPs -  Physician Assistants and Nurse Practitioners) who all work together to provide you with the care you need, when you need it.  Your next appointment:   2 months   The format for your next appointment:   In Person  Provider:   Berniece Salines, DO  Other Instructions  Echocardiogram An echocardiogram is a procedure that uses painless sound waves (ultrasound) to produce an image of the heart. Images from an echocardiogram can provide important information about:  Signs of coronary artery disease (CAD).  Aneurysm detection. An aneurysm is a weak or damaged part of an artery wall that bulges out from the normal force of blood pumping through the body.  Heart size and shape. Changes in the size or shape of the heart can be associated with certain conditions, including heart failure, aneurysm, and CAD.  Heart muscle function.  Heart valve function.  Signs of a past heart attack.  Fluid buildup around the heart.  Thickening of the heart muscle.  A tumor or infectious growth around the heart valves. Tell a health care provider about:  Any allergies you have.  All medicines you are taking, including vitamins, herbs, eye drops, creams, and over-the-counter medicines.  Any blood disorders you have.  Any surgeries you have had.  Any medical conditions you have.  Whether you are pregnant or may be pregnant. What are the risks? Generally, this is a safe procedure. However, problems may occur, including:  Allergic reaction to dye (contrast) that may be used during the procedure. What happens before the procedure? No specific preparation is needed. You may eat and drink normally. What happens during the procedure?   An IV tube may be inserted into one of your veins.  You may receive  contrast through this tube. A contrast is an injection that improves the quality of the pictures from your heart.  A gel will be applied to your chest.  A wand-like tool (transducer) will be moved over your chest. The gel will help to transmit the sound waves from the transducer.  The sound waves will harmlessly bounce off of your heart to allow the heart  images to be captured in real-time motion. The images will be recorded on a computer. The procedure may vary among health care providers and hospitals. What happens after the procedure?  You may return to your normal, everyday life, including diet, activities, and medicines, unless your health care provider tells you not to do that. Summary  An echocardiogram is a procedure that uses painless sound waves (ultrasound) to produce an image of the heart.  Images from an echocardiogram can provide important information about the size and shape of your heart, heart muscle function, heart valve function, and fluid buildup around your heart.  You do not need to do anything to prepare before this procedure. You may eat and drink normally.  After the echocardiogram is completed, you may return to your normal, everyday life, unless your health care provider tells you not to do that. This information is not intended to replace advice given to you by your health care provider. Make sure you discuss any questions you have with your health care provider. Document Released: 01/08/2000 Document Revised: 05/03/2018 Document Reviewed: 02/13/2016 Elsevier Patient Education  2020 [employer].

## 2018-12-03 NOTE — Progress Notes (Signed)
Cardiology Office Note:    Date:  12/03/2018   ID:  [name] [name], DOB May 09, 1960, MRN [id number]  PCP:  Patient, No Pcp Per  Cardiologist:  Berniece Salines, DO  Electrophysiologist:  None   Referring MD: Arvella Nigh, MD   No chief complaint on file.  History of Present Illness:    [name] [name] is a 58 y.o. female with no significant pmhx presents to be evaluated for palpitations. She tells me that recently she has been experiencing abrupt onset of rapid heart beat. She tells me that the fluttering can last for at least 30 minutes prior to resolution. She denies any chest pain, shortness of breath, nausea or lightheadedness.  No other complaints at this time.   History reviewed. No pertinent past medical history.  Past Surgical History:  Procedure Laterality Date  . CHOLECYSTECTOMY    . SHOULDER SURGERY     Rotator cuff repair    Current Medications: Current Meds  Medication Sig  . aspirin EC 81 MG tablet Take 81 mg by mouth daily as needed.  . Cholecalciferol (VITAMIN D3) 125 MCG (5000 UT) CAPS Take 1,000 Units by mouth daily.  . Multiple Vitamins-Minerals (MULTIVITAMIN WITH MINERALS) tablet Take 1 tablet by mouth daily.  . Multiple Vitamins-Minerals (ZINC PO) Take by mouth daily.  . Probiotic Product (PROBIOTIC DAILY PO) Take by mouth.     Allergies:   Bee venom   Social History   Socioeconomic History  . Marital status: Married    Spouse name: Not on file  . Number of children: Not on file  . Years of education: Not on file  . Highest education level: Not on file  Occupational History  . Not on file  Social Needs  . Financial resource strain: Not on file  . Food insecurity    Worry: Not on file    Inability: Not on file  . Transportation needs    Medical: Not on file    Non-medical: Not on file  Tobacco Use  . Smoking status: Never Smoker  . Smokeless tobacco: Never Used  Substance and Sexual Activity  . Alcohol use: Yes    Alcohol/week: 14.0 standard  drinks    Types: 14 Cans of beer per week  . Drug use: Not Currently  . Sexual activity: Not on file  Lifestyle  . Physical activity    Days per week: Not on file    Minutes per session: Not on file  . Stress: Not on file  Relationships  . Social Herbalist on phone: Not on file    Gets together: Not on file    Attends religious service: Not on file    Active member of club or organization: Not on file    Attends meetings of clubs or organizations: Not on file    Relationship status: Not on file  Other Topics Concern  . Not on file  Social History Narrative  . Not on file     Family History: [name] family history includes Heart attack in her mother.  ROS:   Review of Systems  Constitution: Negative for decreased appetite, fever and weight gain.  HENT: Negative for congestion, ear discharge, hoarse voice and sore throat.   Eyes: Negative for discharge, redness, vision loss in right eye and visual halos.  Cardiovascular: Negative for chest pain, dyspnea on exertion, leg swelling, orthopnea and palpitations.  Respiratory: Negative for cough, hemoptysis, shortness of breath and snoring.   Endocrine: Negative for heat  intolerance and polyphagia.  Hematologic/Lymphatic: Negative for bleeding problem. Does not bruise/bleed easily.  Skin: Negative for flushing, nail changes, rash and suspicious lesions.  Musculoskeletal: Negative for arthritis, joint pain, muscle cramps, myalgias, neck pain and stiffness.  Gastrointestinal: Negative for abdominal pain, bowel incontinence, diarrhea and excessive appetite.  Genitourinary: Negative for decreased libido, genital sores and incomplete emptying.  Neurological: Negative for brief paralysis, focal weakness, headaches and loss of balance.  Psychiatric/Behavioral: Negative for altered mental status, depression and suicidal ideas.  Allergic/Immunologic: Negative for HIV exposure and persistent infections.    EKGs/Labs/Other  Studies Reviewed:    The following studies were reviewed today:   EKG:  The ekg ordered today demonstrates sinus arrhythmia Hr 76 bpm, no prior ecg for comparison.  Recent Labs: No results found for requested labs within last 8760 hours.  Recent Lipid Panel No results found for: CHOL, TRIG, HDL, CHOLHDL, VLDL, LDLCALC, LDLDIRECT  Physical Exam:    VS:  BP 122/78 (BP Location: Left Arm, Patient Position: Sitting, Cuff Size: Normal)   Pulse 75   Ht 5' 2.5" (1.588 m)   Wt 153 lb (69.4 kg)   SpO2 96%   BMI 27.54 kg/m     Wt Readings from Last 3 Encounters:  12/03/18 153 lb (69.4 kg)     GEN: Well nourished, well developed in no acute distress HEENT: Normal NECK: No JVD; No carotid bruits LYMPHATICS: No lymphadenopathy CARDIAC: S1S2 noted,RRR, no murmurs, rubs, gallops RESPIRATORY:  Clear to auscultation without rales, wheezing or rhonchi  ABDOMEN: Soft, non-tender, non-distended, +bowel sounds, no guarding. EXTREMITIES: No edema, No cyanosis, no clubbing MUSCULOSKELETAL:  No edema; No deformity  SKIN: Warm and dry NEUROLOGIC:  Alert and oriented x 3, non-focal PSYCHIATRIC:  Normal affect, good insight  ASSESSMENT:    1. Palpitations   2. Sinus arrhythmia   3. Lipid screening    PLAN:    1. I would like to rule out a cardiovascular etiology of this palpitation, therefore at this time I would like to placed a zio patch for  7 days. In additon a transthoracic echocardiogram will be ordered to assess LV/RV function and any structural abnormalities. Once these testing have been performed amd reviewed further reccomendations will be made. For now, I do reccomend that the patient goes to the nearest ED if  symptoms recur.  2. The patient was advice of limiting caffeine, getting more rest if possible in the setting of her sinus arrhythmia.   3. Blood work will be performed today which includes BMP, mag cbc and TSH. Lipid profile prior to her next visit.  The patient is in  agreement with the above plan. The patient left the office in stable condition.  The patient will follow up in 3 months.    Medication Adjustments/Labs and Tests Ordered: Current medicines are reviewed at length with the patient today.  Concerns regarding medicines are outlined above.  Orders Placed This Encounter  Procedures  . Basic Metabolic Panel (BMET)  . Magnesium  . CBC  . TSH  . Lipid Profile  . LONG TERM MONITOR (3-14 DAYS)  . EKG 12-Lead  . ECHOCARDIOGRAM COMPLETE   No orders of the defined types were placed in this encounter.   Patient Instructions  Medication Instructions:  Your physician recommends that you continue on your current medications as directed. Please refer to the Current Medication list given to you today.  *If you need a refill on your cardiac medications before your next appointment, please call your  pharmacy*  Lab Work: Your physician recommends that you return for lab work in: TODAY BMP,CBC,TSH,Magnesium  2 Months prior to appointment: Lipid  If you have labs (blood work) drawn today and your tests are completely normal, you will receive your results only by: Marland Kitchen MyChart Message (if you have MyChart) OR . A paper copy in the mail If you have any lab test that is abnormal or we need to change your treatment, we will call you to review the results.  Testing/Procedures: Your physician has requested that you have an echocardiogram. Echocardiography is a painless test that uses sound waves to create images of your heart. It provides your doctor with information about the size and shape of your heart and how well your heart's chambers and valves are working. This procedure takes approximately one hour. There are no restrictions for this procedure.  A zio monitor was placed today. It will remain on for 7 days. You will then return monitor and event diary in provided box. It takes 1-2 weeks for report to be downloaded and returned to [place]. We will call you  with the results. If monitor falls off or has orange flashing light, please call Zio for further instructions.     Follow-Up: At [facility], you and your health needs are our priority.  As part of our continuing mission to provide you with exceptional heart care, we have created designated Provider Care Teams.  These Care Teams include your primary Cardiologist (physician) and Advanced Practice Providers (APPs -  Physician Assistants and Nurse Practitioners) who all work together to provide you with the care you need, when you need it.  Your next appointment:   2 months  The format for your next appointment:   In Person  Provider:   Berniece Salines, DO  Other Instructions  Echocardiogram An echocardiogram is a procedure that uses painless sound waves (ultrasound) to produce an image of the heart. Images from an echocardiogram can provide important information about:  Signs of coronary artery disease (CAD).  Aneurysm detection. An aneurysm is a weak or damaged part of an artery wall that bulges out from the normal force of blood pumping through the body.  Heart size and shape. Changes in the size or shape of the heart can be associated with certain conditions, including heart failure, aneurysm, and CAD.  Heart muscle function.  Heart valve function.  Signs of a past heart attack.  Fluid buildup around the heart.  Thickening of the heart muscle.  A tumor or infectious growth around the heart valves. Tell a health care provider about:  Any allergies you have.  All medicines you are taking, including vitamins, herbs, eye drops, creams, and over-the-counter medicines.  Any blood disorders you have.  Any surgeries you have had.  Any medical conditions you have.  Whether you are pregnant or may be pregnant. What are the risks? Generally, this is a safe procedure. However, problems may occur, including:  Allergic reaction to dye (contrast) that may be used during the  procedure. What happens before the procedure? No specific preparation is needed. You may eat and drink normally. What happens during the procedure?   An IV tube may be inserted into one of your veins.  You may receive contrast through this tube. A contrast is an injection that improves the quality of the pictures from your heart.  A gel will be applied to your chest.  A wand-like tool (transducer) will be moved over your chest. The gel will  help to transmit the sound waves from the transducer.  The sound waves will harmlessly bounce off of your heart to allow the heart images to be captured in real-time motion. The images will be recorded on a computer. The procedure may vary among health care providers and hospitals. What happens after the procedure?  You may return to your normal, everyday life, including diet, activities, and medicines, unless your health care provider tells you not to do that. Summary  An echocardiogram is a procedure that uses painless sound waves (ultrasound) to produce an image of the heart.  Images from an echocardiogram can provide important information about the size and shape of your heart, heart muscle function, heart valve function, and fluid buildup around your heart.  You do not need to do anything to prepare before this procedure. You may eat and drink normally.  After the echocardiogram is completed, you may return to your normal, everyday life, unless your health care provider tells you not to do that. This information is not intended to replace advice given to you by your health care provider. Make sure you discuss any questions you have with your health care provider. Document Released: 01/08/2000 Document Revised: 05/03/2018 Document Reviewed: 02/13/2016 Elsevier Patient Education  2020 [employer].      Adopting a Healthy Lifestyle.  Know what a healthy weight is for you (roughly BMI <25) and aim to maintain this   Aim for 7+ servings of  fruits and vegetables daily   65-80+ fluid ounces of water or unsweet tea for healthy kidneys   Limit to max 1 drink of alcohol per day; avoid smoking/tobacco   Limit animal fats in diet for cholesterol and heart health - choose grass fed whenever available   Avoid highly processed foods, and foods high in saturated/trans fats   Aim for low stress - take time to unwind and care for your mental health   Aim for 150 min of moderate intensity exercise weekly for heart health, and weights twice weekly for bone health   Aim for 7-9 hours of sleep daily   When it comes to diets, agreement about the perfect plan isnt easy to find, even among the experts. Experts at the [name] developed an idea known as the Healthy Eating Plate. Just imagine a plate divided into logical, healthy portions.   The emphasis is on diet quality:   Load up on vegetables and fruits - one-half of your plate: Aim for color and variety, and remember that potatoes dont count.   Go for whole grains - one-quarter of your plate: Whole wheat, barley, wheat berries, quinoa, oats, brown rice, and foods made with them. If you want pasta, go with whole wheat pasta.   Protein power - one-quarter of your plate: Fish, chicken, beans, and nuts are all healthy, versatile protein sources. Limit red meat.   The diet, however, does go beyond the plate, offering a few other suggestions.   Use healthy plant oils, such as olive, canola, soy, corn, sunflower and peanut. Check the labels, and avoid partially hydrogenated oil, which have unhealthy trans fats.   If youre thirsty, drink water. Coffee and tea are good in moderation, but skip sugary drinks and limit milk and dairy products to one or two daily servings.   The type of carbohydrate in the diet is more important than the amount. Some sources of carbohydrates, such as vegetables, fruits, whole grains, and beans-are healthier than others.   Finally, stay  active  Signed, Berniece Salines, DO  12/03/2018 9:05 PM    [facility]

## 2018-12-04 LAB — BASIC METABOLIC PANEL
BUN/Creatinine Ratio: 22 (ref 12–28)
BUN: 17 mg/dL (ref 8–27)
CO2: 24 mmol/L (ref 20–29)
Calcium: 9.7 mg/dL (ref 8.7–10.3)
Chloride: 106 mmol/L (ref 96–106)
Creatinine, Ser: 0.77 mg/dL (ref 0.57–1.00)
GFR calc Af Amer: 97 mL/min/{1.73_m2} (ref >59)
GFR calc non Af Amer: 84 mL/min/{1.73_m2} (ref >59)
Glucose: 97 mg/dL (ref 65–99)
Potassium: 4 mmol/L (ref 3.5–5.2)
Sodium: 143 mmol/L (ref 134–144)

## 2018-12-04 LAB — CBC
Hematocrit: 38.5 % (ref 34.0–46.6)
Hemoglobin: 12.9 g/dL (ref 11.1–15.9)
MCH: 30.7 pg (ref 26.6–33.0)
MCHC: 33.5 g/dL (ref 31.5–35.7)
MCV: 92 fL (ref 79–97)
Platelets: 255 10*3/uL (ref 150–450)
RBC: 4.2 x10E6/uL (ref 3.77–5.28)
RDW: 12.3 % (ref 11.7–15.4)
WBC: 5.5 10*3/uL (ref 3.4–10.8)

## 2018-12-04 LAB — TSH: TSH: 1.23 u[IU]/mL (ref 0.450–4.500)

## 2018-12-04 LAB — MAGNESIUM: Magnesium: 2 mg/dL (ref 1.6–2.3)

## 2018-12-05 LAB — LIPID PANEL
Chol/HDL Ratio: 2.4 ratio (ref 0.0–4.4)
Cholesterol, Total: 181 mg/dL (ref 100–199)
HDL: 77 mg/dL (ref >39)
LDL Chol Calc (NIH): 82 mg/dL (ref 0–99)
Triglycerides: 131 mg/dL (ref 0–149)
VLDL Cholesterol Cal: 22 mg/dL (ref 5–40)

## 2018-12-06 ENCOUNTER — Other Ambulatory Visit: Payer: Self-pay

## 2018-12-06 ENCOUNTER — Ambulatory Visit (HOSPITAL_BASED_OUTPATIENT_CLINIC_OR_DEPARTMENT_OTHER)
Admission: RE | Admit: 2018-12-06 | Discharge: 2018-12-06 | Disposition: A | Discharge status: Home / Self Care | Payer: No Typology Code available for payment source | Source: Ambulatory Visit | Attending: Cardiology | Admitting: Cardiology

## 2018-12-06 DIAGNOSIS — R002 Palpitations: Secondary | ICD-10-CM | POA: Diagnosis not present

## 2018-12-06 NOTE — Progress Notes (Signed)
  Echocardiogram 2D Echocardiogram has been performed.  [name] [name] 12/06/2018, 9:48 AM

## 2018-12-07 ENCOUNTER — Encounter: Payer: Self-pay | Admitting: *Deleted

## 2018-12-07 ENCOUNTER — Telehealth: Payer: Self-pay | Admitting: *Deleted

## 2018-12-07 NOTE — Telephone Encounter (Signed)
-----   Message from Berniece Salines, DO sent at 12/06/2018  7:41 PM EST ----- Normal lab and normal echo. Please notify patient and send information to the pcp.

## 2018-12-07 NOTE — Telephone Encounter (Signed)
Telephone call to patient. Left message that labs and echo were normal and to call with any questions.

## 2018-12-17 ENCOUNTER — Ambulatory Visit: Payer: No Typology Code available for payment source | Admitting: Cardiology

## 2019-01-01 ENCOUNTER — Encounter: Payer: Self-pay | Admitting: Cardiology

## 2019-01-01 ENCOUNTER — Other Ambulatory Visit: Payer: Self-pay

## 2019-01-01 ENCOUNTER — Ambulatory Visit (INDEPENDENT_AMBULATORY_CARE_PROVIDER_SITE_OTHER): Payer: No Typology Code available for payment source | Admitting: Cardiology

## 2019-01-01 VITALS — BP 124/83 | HR 64 | Ht 62.5 in | Wt 156.0 lb

## 2019-01-01 DIAGNOSIS — I483 Typical atrial flutter: Secondary | ICD-10-CM

## 2019-01-01 HISTORY — DX: Typical atrial flutter: I48.3

## 2019-01-01 MED ORDER — METOPROLOL SUCCINATE ER 25 MG PO TB24: 12.5000 mg | ORAL_TABLET | Freq: Every day | ORAL | 1 refills | Status: DC

## 2019-01-01 NOTE — Progress Notes (Signed)
Cardiology Office Note:    Date:  01/01/2019   ID:  [name] [name], DOB 01-04-1961, MRN [id number]  PCP:  Patient, No Pcp Per  Cardiologist:  Berniece Salines, DO  Electrophysiologist:  None   Referring MD: No ref. provider found   Follow up visit   History of Present Illness:    [name] [name] is a 58 y.o. female with a hx of no significant medical history presented on 12/03/2018 to be evaluated for palpitations. She reported to me that she had been experiencing abrupt onset of fast heart beat. She also describes some fluttering sensation, therefore at the conclusion of the visit I recommended that she get a TTE and a ZIo monitor.  Blood work was also recommended that day - with advise to limit caffeine drinks due to sinus arrhythmia on ekg.  In the interim she was able to get these testing done.  No past medical history on file.  Past Surgical History:  Procedure Laterality Date  . CHOLECYSTECTOMY    . SHOULDER SURGERY     Rotator cuff repair    Current Medications: No outpatient medications have been marked as taking for the 01/01/19 encounter (Appointment) with Berniece Salines, DO.     Allergies:   Bee venom   Social History   Socioeconomic History  . Marital status: Married    Spouse name: Not on file  . Number of children: Not on file  . Years of education: Not on file  . Highest education level: Not on file  Occupational History  . Not on file  Social Needs  . Financial resource strain: Not on file  . Food insecurity    Worry: Not on file    Inability: Not on file  . Transportation needs    Medical: Not on file    Non-medical: Not on file  Tobacco Use  . Smoking status: Never Smoker  . Smokeless tobacco: Never Used  Substance and Sexual Activity  . Alcohol use: Yes    Alcohol/week: 14.0 standard drinks    Types: 14 Cans of beer per week  . Drug use: Not Currently  . Sexual activity: Not on file  Lifestyle  . Physical activity    Days per week: Not on file   Minutes per session: Not on file  . Stress: Not on file  Relationships  . Social Herbalist on phone: Not on file    Gets together: Not on file    Attends religious service: Not on file    Active member of club or organization: Not on file    Attends meetings of clubs or organizations: Not on file    Relationship status: Not on file  Other Topics Concern  . Not on file  Social History Narrative  . Not on file     Family History: [name] family history includes Heart attack in her mother.  ROS:   Review of Systems  Constitution: Negative for decreased appetite, fever and weight gain.  HENT: Negative for congestion, ear discharge, hoarse voice and sore throat.   Eyes: Negative for discharge, redness, vision loss in right eye and visual halos.  Cardiovascular: Negative for chest pain, dyspnea on exertion, leg swelling, orthopnea and palpitations.  Respiratory: Negative for cough, hemoptysis, shortness of breath and snoring.   Endocrine: Negative for heat intolerance and polyphagia.  Hematologic/Lymphatic: Negative for bleeding problem. Does not bruise/bleed easily.  Skin: Negative for flushing, nail changes, rash and suspicious lesions.  Musculoskeletal: Negative for  arthritis, joint pain, muscle cramps, myalgias, neck pain and stiffness.  Gastrointestinal: Negative for abdominal pain, bowel incontinence, diarrhea and excessive appetite.  Genitourinary: Negative for decreased libido, genital sores and incomplete emptying.  Neurological: Negative for brief paralysis, focal weakness, headaches and loss of balance.  Psychiatric/Behavioral: Negative for altered mental status, depression and suicidal ideas.  Allergic/Immunologic: Negative for HIV exposure and persistent infections.    EKGs/Labs/Other Studies Reviewed:    The following studies were reviewed today:   EKG:  The ekg ordered today demonstrates sinus rhythm, heart rate 61 bpm compared to prior EKG is  arrhythmia no longer present.  TTE 12/06/2018 IMPRESSIONS  1. Left ventricular ejection fraction, by visual estimation, is 60 to 65%. The left ventricle has normal function. There is no left ventricular hypertrophy.  2. Global right ventricle has normal systolic function.The right ventricular size is mildly enlarged. No increase in right ventricular wall thickness.  3. Left atrial size was normal.  4. Right atrial size was normal.  5. Mild mitral annular calcification.  6. The mitral valve is normal in structure. No evidence of mitral valve regurgitation. No evidence of mitral stenosis.  7. The tricuspid valve is normal in structure. Tricuspid valve regurgitation is mild.  8. The aortic valve is normal in structure. Aortic valve regurgitation is not visualized. No evidence of aortic valve sclerosis or stenosis.  9. The pulmonic valve was normal in structure. Pulmonic valve regurgitation is not visualized. 10. Normal pulmonary artery systolic pressure. 11. No prior TTE for comparison.  Zio Monitor: The patient wore the monitor for 7 days 1 hour. Indication: Palpitations The minimum heart rate was 51 bpm, maximum heart rate was  178 bpm, and average heart rate was 80 bpm. The predominant underlying rhythm was Sinus Rhythm. Atrial Flutter occurred (1% burden), ranging from 71-178 bpm (avg of 120 bpm), the longest lasting 7 mins 48 secs with an average rate of 115 bpm.  Atrial Flutter was detected within +/- 45 seconds of symptomatic patient event(s).  Premature atrial complexes were rare occasional (1.9%, [id number]). Premature complexes ventricular were rare (<1.0%, 517). No ventricular tachycardia, No Pause, No AV blocks was present.  33 patient triggered events noted: 21 of the patient triggered events were associated with atrial flutter. Conclusion: This study is remarkable for symptomatic atrial flutter ( there was a total of 1% burden of atrial flutter).  Recent Labs: 12/03/2018: BUN 17;  Creatinine, Ser 0.77; Hemoglobin 12.9; Magnesium 2.0; Platelets 255; Potassium 4.0; Sodium 143; TSH 1.230  Recent Lipid Panel    Component Value Date/Time   CHOL 181 12/04/2018 0842   TRIG 131 12/04/2018 0842   HDL 77 12/04/2018 0842   CHOLHDL 2.4 12/04/2018 0842   LDLCALC 82 12/04/2018 0842    Physical Exam:    VS:  There were no vitals taken for this visit.    Wt Readings from Last 3 Encounters:  12/03/18 153 lb (69.4 kg)     GEN: Well nourished, well developed in no acute distress HEENT: Normal NECK: No JVD; No carotid bruits LYMPHATICS: No lymphadenopathy CARDIAC: S1S2 noted,RRR, no murmurs, rubs, gallops RESPIRATORY:  Clear to auscultation without rales, wheezing or rhonchi  ABDOMEN: Soft, non-tender, non-distended, +bowel sounds, no guarding. EXTREMITIES: No edema, No cyanosis, no clubbing MUSCULOSKELETAL:  No edema; No deformity  SKIN: Warm and dry NEUROLOGIC:  Alert and oriented x 3, non-focal PSYCHIATRIC:  Normal affect, good insight  ASSESSMENT:    No diagnosis found. PLAN:     Her monitor does  show 1% burden of typical atrial flutter, I discussed with the patient her new diagnosis.  She has been educated about rate control strategy, rhythm control strategy as well as prevention.  In terms of stroke prevention CHA2DS2-VASc score is 1 considering patient being a female therefore full anticoagulation is not indicated at this time.  She is already on aspirin 81 mg daily I am going to continue the patient on this medication.   For now  I will start her on low-dose Toprol XL 12.5 mg daily.  We discussed in great detail the possibility of starting antiarrhythmics which I would consider flecainide 50 mg twice daily as her her echocardiogram does show no structural abnormalities.  On the other hand I educated the patient about the possibility of an ablation in the setting of her typical flutter.    She is very symptomatic with her atrial flutter which was also displayed on  a monitor by her triggered events.  Therefore is not unreasonable for the patient to want to discuss with EP and pursue an ablation without a trial of antiarrhythmics.  Also the patient will refer not to be on antiarrhythmics especially after discussing potential side effects of the medication.  For now I am going to refer her to EPS further discussion on possible typical atrial flutter ablation.  The patient is in agreement with the above plan. The patient left the office in stable condition.  The patient will follow up in 3 months or sooner if needed.   Medication Adjustments/Labs and Tests Ordered: Current medicines are reviewed at length with the patient today.  Concerns regarding medicines are outlined above.  No orders of the defined types were placed in this encounter.  No orders of the defined types were placed in this encounter.   There are no Patient Instructions on file for this visit.   Adopting a Healthy Lifestyle.  Know what a healthy weight is for you (roughly BMI <25) and aim to maintain this   Aim for 7+ servings of fruits and vegetables daily   65-80+ fluid ounces of water or unsweet tea for healthy kidneys   Limit to max 1 drink of alcohol per day; avoid smoking/tobacco   Limit animal fats in diet for cholesterol and heart health - choose grass fed whenever available   Avoid highly processed foods, and foods high in saturated/trans fats   Aim for low stress - take time to unwind and care for your mental health   Aim for 150 min of moderate intensity exercise weekly for heart health, and weights twice weekly for bone health   Aim for 7-9 hours of sleep daily   When it comes to diets, agreement about the perfect plan isnt easy to find, even among the experts. Experts at the [employer] developed an idea known as the Healthy Eating Plate. Just imagine a plate divided into logical, healthy portions.   The emphasis is on diet quality:   Load  up on vegetables and fruits - one-half of your plate: Aim for color and variety, and remember that potatoes dont count.   Go for whole grains - one-quarter of your plate: Whole wheat, barley, wheat berries, quinoa, oats, brown rice, and foods made with them. If you want pasta, go with whole wheat pasta.   Protein power - one-quarter of your plate: Fish, chicken, beans, and nuts are all healthy, versatile protein sources. Limit red meat.   The diet, however, does go beyond the plate,  offering a few other suggestions.   Use healthy plant oils, such as olive, canola, soy, corn, sunflower and peanut. Check the labels, and avoid partially hydrogenated oil, which have unhealthy trans fats.   If youre thirsty, drink water. Coffee and tea are good in moderation, but skip sugary drinks and limit milk and dairy products to one or two daily servings.   The type of carbohydrate in the diet is more important than the amount. Some sources of carbohydrates, such as vegetables, fruits, whole grains, and beans-are healthier than others.   Finally, stay active  Signed, Berniece Salines, DO  01/01/2019 8:25 AM    [place]

## 2019-01-01 NOTE — Patient Instructions (Signed)
Medication Instructions:  Your physician has recommended you make the following change in your medication:   START: Toprol XL(metoprolol succinate ) 25 mg Take 1/2 tab (12.5 mg ) daily  *If you need a refill on your cardiac medications before your next appointment, please call your pharmacy*  Lab Work: None If you have labs (blood work) drawn today and your tests are completely normal, you will receive your results only by: Marland Kitchen MyChart Message (if you have MyChart) OR . A paper copy in the mail If you have any lab test that is abnormal or we need to change your treatment, we will call you to review the results.  Testing/Procedures: None  Follow-Up: At [facility], you and your health needs are our priority.  As part of our continuing mission to provide you with exceptional heart care, we have created designated Provider Care Teams.  These Care Teams include your primary Cardiologist (physician) and Advanced Practice Providers (APPs -  Physician Assistants and Nurse Practitioners) who all work together to provide you with the care you need, when you need it.  Your next appointment:   3 month(s)  The format for your next appointment:   In Person  Provider:   Berniece Salines, DO  Other Instructions You are being referred to Dr Caryl Comes an electrophysiologist for your atrial flutter. They will contact you with date and time of appointment.

## 2019-01-03 ENCOUNTER — Other Ambulatory Visit: Payer: Self-pay

## 2019-01-03 ENCOUNTER — Ambulatory Visit (INDEPENDENT_AMBULATORY_CARE_PROVIDER_SITE_OTHER): Payer: No Typology Code available for payment source | Admitting: Internal Medicine

## 2019-01-03 VITALS — BP 126/80 | HR 70 | Ht 62.5 in | Wt 156.0 lb

## 2019-01-03 DIAGNOSIS — R002 Palpitations: Secondary | ICD-10-CM | POA: Diagnosis not present

## 2019-01-03 DIAGNOSIS — I483 Typical atrial flutter: Secondary | ICD-10-CM

## 2019-01-03 DIAGNOSIS — I498 Other specified cardiac arrhythmias: Secondary | ICD-10-CM

## 2019-01-03 NOTE — Progress Notes (Signed)
ELECTROPHYSIOLOGY CONSULT NOTE  Patient ID: [name] [name], MRN: [id number], DOB/AGE: Mar 05, 1960 58 y.o. Admit date: (Not on file) Date of Consult: 01/03/2019  Primary Physician: Patient, No Pcp Per Primary Cardiologist: tobb     [name] [name] is a 58 y.o. female who is being seen today for the evaluation of atrial flutter  at the request of Dr Harriet Masson.    HPI [name] [name] is a 58 y.o. female referred because of a 28-month history of palpitations.  These have been most notable when she has been quiet.  They do not cause lightheadedness.  Makes her aware of her bradycardia but does not cause dyspnea.  No associated chest pain.  She notes no association with caffeine and/or alcohol.  She sought cardiac consultation.  An event recorder was undertaken.  Please see below.  Echocardiogram was normal  Medical history is largely negative. Past Medical History:  Diagnosis Date  . Abnormal bleeding in menstrual cycle   . Abnormal Pap smear of vagina   . Endometrial polyp   . Hot flashes   . Irregular heart beats   . Typical atrial flutter ([place]) 01/01/2019      Surgical History:  Past Surgical History:  Procedure Laterality Date  . CHOLECYSTECTOMY    . SHOULDER SURGERY     Rotator cuff repair     Home Meds: Current Meds  Medication Sig  . aspirin EC 81 MG tablet Take 81 mg by mouth daily.   . Cholecalciferol (VITAMIN D3) 125 MCG (5000 UT) CAPS Take 1,000 Units by mouth daily.  . metoprolol succinate (TOPROL XL) 25 MG 24 hr tablet Take 0.5 tablets (12.5 mg total) by mouth daily.  . Multiple Vitamins-Minerals (MULTIVITAMIN WITH MINERALS) tablet Take 1 tablet by mouth daily.  . Multiple Vitamins-Minerals (ZINC PO) Take 1 tablet by mouth daily.   . Probiotic Product (PROBIOTIC DAILY PO) Take 1 tablet by mouth daily.     Allergies:  Allergies  Allergen Reactions  . Bee Venom Swelling    Social History   Socioeconomic History  . Marital status: Married    Spouse name:  Not on file  . Number of children: Not on file  . Years of education: Not on file  . Highest education level: Not on file  Occupational History  . Not on file  Tobacco Use  . Smoking status: Never Smoker  . Smokeless tobacco: Never Used  Substance and Sexual Activity  . Alcohol use: Yes    Alcohol/week: 14.0 standard drinks    Types: 14 Cans of beer per week  . Drug use: Not Currently  . Sexual activity: Not on file  Other Topics Concern  . Not on file  Social History Narrative  . Not on file   Social Determinants of Health   Financial Resource Strain:   . Difficulty of Paying Living Expenses: Not on file  Food Insecurity:   . Worried About Charity fundraiser in the Last Year: Not on file  . Ran Out of Food in the Last Year: Not on file  Transportation Needs:   . Lack of Transportation (Medical): Not on file  . Lack of Transportation (Non-Medical): Not on file  Physical Activity:   . Days of Exercise per Week: Not on file  . Minutes of Exercise per Session: Not on file  Stress:   . Feeling of Stress : Not on file  Social Connections:   . Frequency of Communication with Friends and Family: Not on  file  . Frequency of Social Gatherings with Friends and Family: Not on file  . Attends Religious Services: Not on file  . Active Member of Clubs or Organizations: Not on file  . Attends Archivist Meetings: Not on file  . Marital Status: Not on file  Intimate Partner Violence:   . Fear of Current or Ex-Partner: Not on file  . Emotionally Abused: Not on file  . Physically Abused: Not on file  . Sexually Abused: Not on file     Family History  Problem Relation Age of Onset  . Heart attack Mother      ROS:  Please see the history of present illness.     All other systems reviewed and negative.    Physical Exam: Blood pressure 126/80, pulse 70, height 5' 2.5" (1.588 m), weight 156 lb (70.8 kg), SpO2 97 %. General: Well developed, well nourished female in no  acute distress. Head: Normocephalic, atraumatic, sclera non-icteric, no xanthomas, nares are without discharge. EENT: normal  Lymph Nodes:  none Neck: Negative for carotid bruits. JVD not elevated. Back:without scoliosis kyphosis Lungs: Clear bilaterally to auscultation without wheezes, rales, or rhonchi. Breathing is unlabored. Heart: RRR with S1 S2. No murmur . No rubs, or gallops appreciated. Abdomen: Soft, non-tender, non-distended with normoactive bowel sounds. No hepatomegaly. No rebound/guarding. No obvious abdominal masses. Msk:  Strength and tone appear normal for age. Extremities: No clubbing or cyanosis. No  edema.  Distal pedal pulses are 2+ and equal bilaterally. Skin: Warm and Dry Neuro: Alert and oriented X 3. CN III-XII intact Grossly normal sensory and motor function . Psych:  Responds to questions appropriately with a normal affect.      Labs: Cardiac Enzymes No results for input(s): CKTOTAL, CKMB, TROPONINI in the last 72 hours. CBC Lab Results  Component Value Date   WBC 5.5 12/03/2018   HGB 12.9 12/03/2018   HCT 38.5 12/03/2018   MCV 92 12/03/2018   PLT 255 12/03/2018   PROTIME: No results for input(s): LABPROT, INR in the last 72 hours. Chemistry No results for input(s): NA, K, CL, CO2, BUN, CREATININE, CALCIUM, PROT, BILITOT, ALKPHOS, ALT, AST, GLUCOSE in the last 168 hours.  Invalid input(s): LABALBU Lipids Lab Results  Component Value Date   CHOL 181 12/04/2018   HDL 77 12/04/2018   LDLCALC 82 12/04/2018   TRIG 131 12/04/2018   BNP No results found for: PROBNP Thyroid Function Tests: No results for input(s): TSH, T4TOTAL, T3FREE, THYROIDAB in the last 72 hours.  Invalid input(s): FREET3 Miscellaneous No results found for: DDIMER  Radiology/Studies:  ECHOCARDIOGRAM COMPLETE  Result Date: 12/06/2018   ECHOCARDIOGRAM REPORT   Patient Name:   [name] [name] Date of Exam: 12/06/2018 Medical Rec #:  [id number]     Height:       62.5 in Accession  #:    [id number]    Weight:       153.0 lb Date of Birth:  09/03/1960     BSA:          1.72 m Patient Age:    74 years      BP:           122/78 mmHg Patient Gender: F             HR:           67 bpm. Exam Location:  [place] Procedure: 2D Echo, Cardiac Doppler and Color Doppler Indications:    Palpitations  History:  Patient has no prior history of Echocardiogram examinations.  Sonographer:    Cardell Peach RDCS (AE) Referring Phys: [id number] [place]  1. Left ventricular ejection fraction, by visual estimation, is 60 to 65%. The left ventricle has normal function. There is no left ventricular hypertrophy.  2. Global right ventricle has normal systolic function.The right ventricular size is mildly enlarged. No increase in right ventricular wall thickness.  3. Left atrial size was normal.  4. Right atrial size was normal.  5. Mild mitral annular calcification.  6. The mitral valve is normal in structure. No evidence of mitral valve regurgitation. No evidence of mitral stenosis.  7. The tricuspid valve is normal in structure. Tricuspid valve regurgitation is mild.  8. The aortic valve is normal in structure. Aortic valve regurgitation is not visualized. No evidence of aortic valve sclerosis or stenosis.  9. The pulmonic valve was normal in structure. Pulmonic valve regurgitation is not visualized. 10. Normal pulmonary artery systolic pressure. 11. No prior TTE for comparison. FINDINGS  Left Ventricle: Left ventricular ejection fraction, by visual estimation, is 60 to 65%. The left ventricle has normal function. There is no left ventricular hypertrophy. Left ventricular diastolic parameters were normal. Normal left atrial pressure. Right Ventricle: The right ventricular size is mildly enlarged. No increase in right ventricular wall thickness. Global RV systolic function is has normal systolic function. The tricuspid regurgitant velocity is 2.02 m/s, and with an assumed right atrial  pressure of  3 mmHg, the estimated right ventricular systolic pressure is normal at 19.4 mmHg. Left Atrium: Left atrial size was normal in size. Right Atrium: Right atrial size was normal in size Pericardium: There is no evidence of pericardial effusion. Mitral Valve: The mitral valve is normal in structure. Mild mitral annular calcification. No evidence of mitral valve stenosis by observation. No evidence of mitral valve regurgitation. Tricuspid Valve: The tricuspid valve is normal in structure. Tricuspid valve regurgitation is mild. Aortic Valve: The aortic valve is normal in structure. Aortic valve regurgitation is not visualized. The aortic valve is structurally normal, with no evidence of sclerosis or stenosis. Pulmonic Valve: The pulmonic valve was normal in structure. Pulmonic valve regurgitation is not visualized. Aorta: The aortic root, ascending aorta and aortic arch are all structurally normal, with no evidence of dilitation or obstruction. Venous: The inferior vena cava is normal in size with greater than 50% respiratory variability, suggesting right atrial pressure of 3 mmHg. IAS/Shunts: No atrial level shunt detected by color flow Doppler. No ventricular septal defect is seen or detected. There is no evidence of an atrial septal defect.  LEFT VENTRICLE PLAX 2D LVIDd:         3.96 cm  Diastology LVIDs:         2.13 cm  LV e' lateral:   9.45 cm/s LV PW:         0.95 cm  LV E/e' lateral: 8.1 LV IVS:        0.87 cm  LV e' medial:    10.10 cm/s LVOT diam:     1.70 cm  LV E/e' medial:  7.6 LV SV:         53 ml LV SV Index:   30.20    2D Longitudinal Strain LVOT Area:     2.27 cm 2D Strain GLS Avg:     -19.8 %  RIGHT VENTRICLE             IVC RV Basal diam:  4.18 cm  IVC diam: 1.04 cm RV S prime:     12.00 cm/s TAPSE (M-mode): 2.3 cm LEFT ATRIUM             Index       RIGHT ATRIUM           Index LA diam:        2.90 cm 1.69 cm/m  RA Area:     16.10 cm LA Vol (A2C):   39.1 ml 22.78 ml/m RA Volume:   44.50 ml   25.93 ml/m LA Vol (A4C):   26.3 ml 15.30 ml/m LA Biplane Vol: 40.7 ml 23.72 ml/m  AORTIC VALVE LVOT Vmax:   96.70 cm/s LVOT Vmean:  64.900 cm/s LVOT VTI:    0.204 m  AORTA Ao Root diam: 2.40 cm Ao Asc diam:  3.20 cm MITRAL VALVE                        TRICUSPID VALVE MV Area (PHT): 3.39 cm             TR Peak grad:   16.4 mmHg MV PHT:        64.82 msec           TR Vmax:        212.00 cm/s MV Decel Time: 224 msec MV E velocity: 76.60 cm/s 103 cm/s  SHUNTS MV A velocity: 62.20 cm/s 70.3 cm/s Systemic VTI:  0.20 m MV E/A ratio:  1.23       1.5       Systemic Diam: 1.70 cm  Kardie Tobb DO Electronically signed by Berniece Salines DO Signature Date/Time: 12/06/2018/12:37:26 PM    Final     EKG: *ECG demonstrated sinus rhythm at 70 Intervals 13/07/48 Otherwise normal  ECG November/20 also demonstrates normal sinus rhythm  Event recorder was personally reviewed.  3% burden of nonsustained atrial tachycardia with 2% of PACs.  This does not represent atrial flutter.      Assessment and Plan:  Atrial tachycardia-nonsustained  The patient has nonsustained atrial tachycardia comprising about 1-2% of her beats.  They are minimally bothersome.  We discussed the potential that they could contribute over time to the development of atrial fibrillation if perhaps they are coming from the pulmonary veins.  For now, we will discontinue her aspirin.  We will discontinue her metoprolol.  She can take it as needed.  We discussed potential contributors including stress alcohol.  We will see her again as needed. Virl Axe

## 2019-01-03 NOTE — Patient Instructions (Signed)
Medication Instructions:  Your physician recommends that you continue on your current medications as directed. Please refer to the Current Medication list given to you today.  Labwork: None ordered.  Testing/Procedures: None ordered.  Follow-Up: Your physician recommends that you schedule a follow-up appointment as needed with Dr Klein  Any Other Special Instructions Will Be Listed Below (If Applicable).     If you need a refill on your cardiac medications before your next appointment, please call your pharmacy.  

## 2019-01-23 ENCOUNTER — Ambulatory Visit: Payer: No Typology Code available for payment source | Admitting: Family

## 2019-01-24 ENCOUNTER — Institutional Professional Consult (permissible substitution): Payer: No Typology Code available for payment source | Admitting: Internal Medicine

## 2019-01-25 HISTORY — PX: BREAST IMPLANT REMOVAL: SUR1101

## 2019-03-28 ENCOUNTER — Ambulatory Visit: Payer: No Typology Code available for payment source | Admitting: Cardiology

## 2019-08-25 DIAGNOSIS — U071 COVID-19: Secondary | ICD-10-CM

## 2019-08-25 HISTORY — DX: COVID-19: U07.1

## 2019-09-09 ENCOUNTER — Encounter (HOSPITAL_BASED_OUTPATIENT_CLINIC_OR_DEPARTMENT_OTHER): Payer: Self-pay | Admitting: *Deleted

## 2019-09-09 ENCOUNTER — Other Ambulatory Visit: Payer: Self-pay

## 2019-09-09 ENCOUNTER — Emergency Department (HOSPITAL_BASED_OUTPATIENT_CLINIC_OR_DEPARTMENT_OTHER)
Admission: EM | Admit: 2019-09-09 | Discharge: 2019-09-09 | Disposition: A | Discharge status: Home / Self Care | Payer: No Typology Code available for payment source | Attending: Emergency Medicine | Admitting: Emergency Medicine

## 2019-09-09 ENCOUNTER — Emergency Department (HOSPITAL_BASED_OUTPATIENT_CLINIC_OR_DEPARTMENT_OTHER): Payer: No Typology Code available for payment source

## 2019-09-09 DIAGNOSIS — U071 COVID-19: Secondary | ICD-10-CM | POA: Insufficient documentation

## 2019-09-09 DIAGNOSIS — Z5321 Procedure and treatment not carried out due to patient leaving prior to being seen by health care provider: Secondary | ICD-10-CM | POA: Insufficient documentation

## 2019-09-09 DIAGNOSIS — R0602 Shortness of breath: Secondary | ICD-10-CM | POA: Diagnosis present

## 2019-09-09 IMAGING — DX DG CHEST 1V PORT
1 series · 1 of 1 positions shown · non-contrast
Comparison: None.

CLINICAL DATA: COVID positive with shortness of breath.

EXAM:
PORTABLE CHEST 1 VIEW

[chest ap]
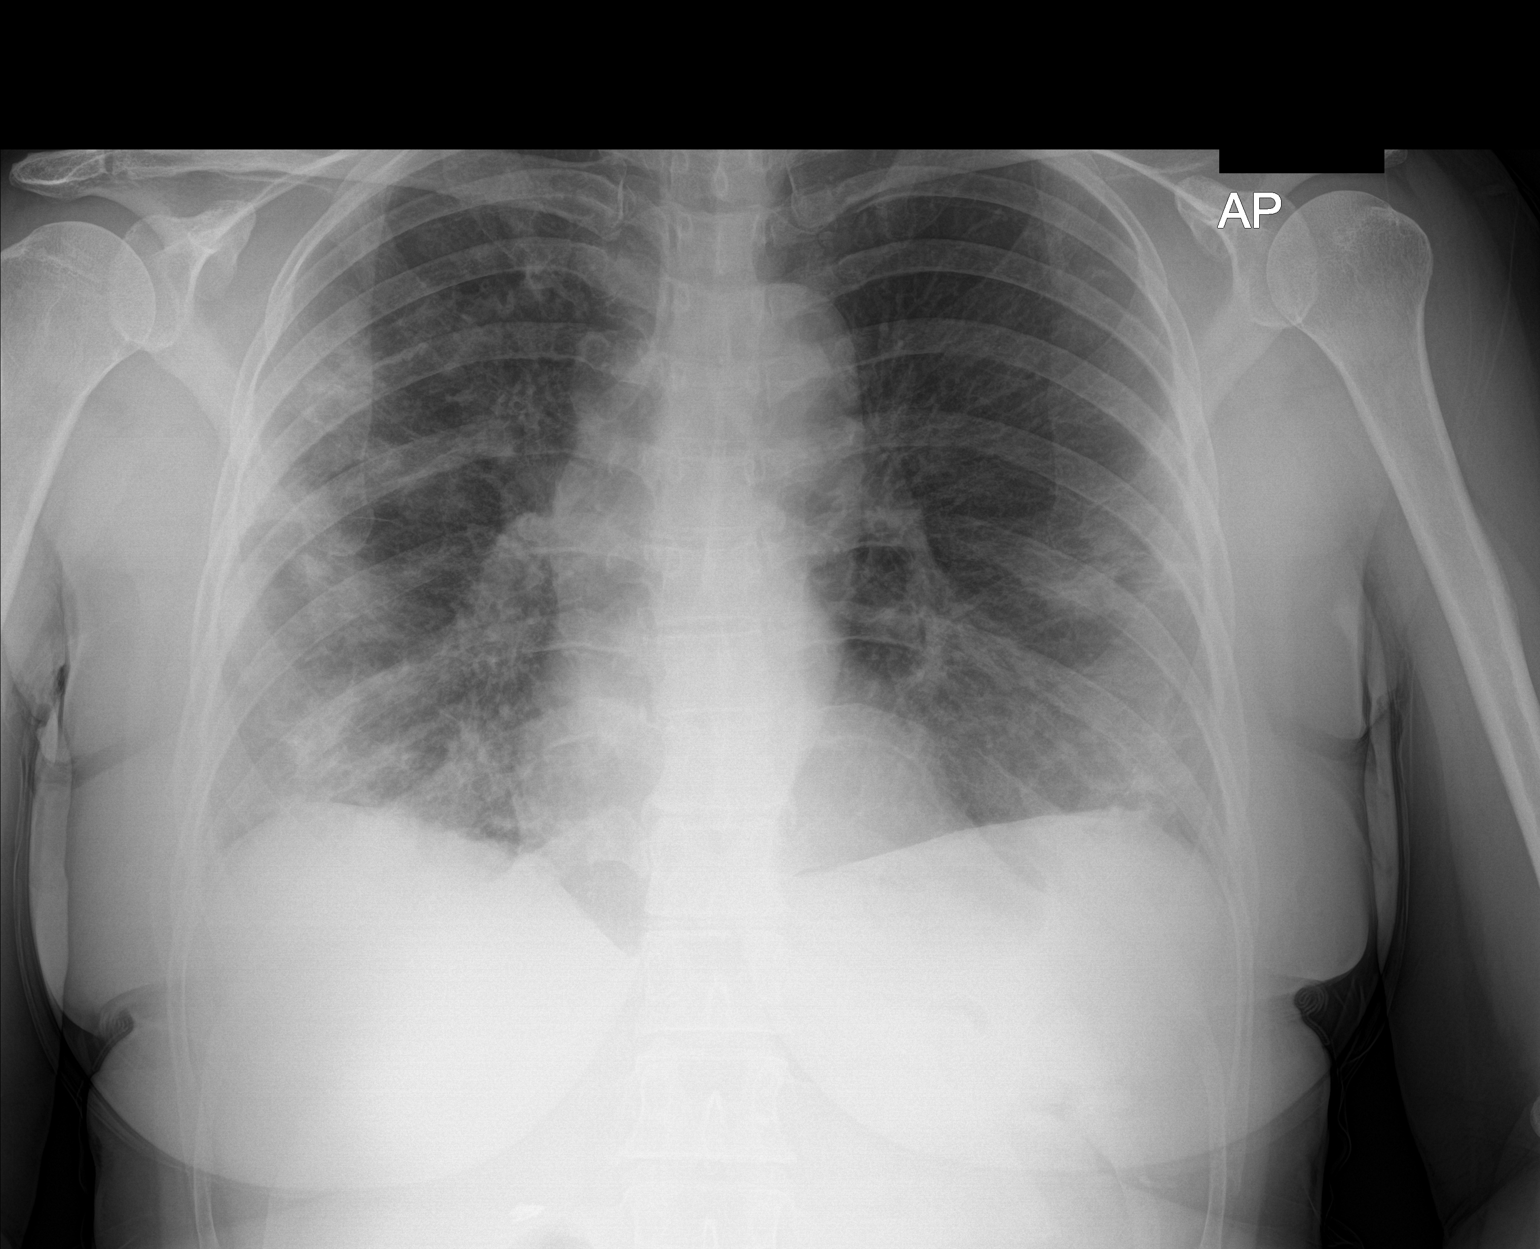

[1 of 1 positions shown; findings below may reference images not displayed]

FINDINGS: Mild to moderate severity multifocal infiltrates are seen within the
bilateral lung bases and along the periphery of the mid lung fields.
The heart size and mediastinal contours are within normal limits.
Radiopaque surgical clips are seen overlying the right upper
quadrant. The visualized skeletal structures are unremarkable.
IMPRESSION: Mild to moderate severity multifocal infiltrates.

## 2019-09-09 NOTE — ED Notes (Signed)
Patient ambulated after triage,  SpO2 94-96, HR 95-100.  Endorses mild DOE, BBS CTA.

## 2019-09-09 NOTE — ED Triage Notes (Signed)
Covid + 7/31, Sob x 2 days

## 2019-09-16 DIAGNOSIS — N951 Menopausal and female climacteric states: Secondary | ICD-10-CM | POA: Insufficient documentation

## 2021-02-17 DIAGNOSIS — Z87410 Personal history of cervical dysplasia: Secondary | ICD-10-CM | POA: Insufficient documentation

## 2021-02-18 ENCOUNTER — Other Ambulatory Visit: Payer: Self-pay | Admitting: Obstetrics and Gynecology

## 2021-02-18 DIAGNOSIS — R2231 Localized swelling, mass and lump, right upper limb: Secondary | ICD-10-CM

## 2021-03-04 ENCOUNTER — Ambulatory Visit
Admission: RE | Admit: 2021-03-04 | Discharge: 2021-03-04 | Disposition: A | Discharge status: Home / Self Care | Payer: No Typology Code available for payment source | Source: Ambulatory Visit | Attending: Obstetrics and Gynecology | Admitting: Obstetrics and Gynecology

## 2021-03-04 ENCOUNTER — Other Ambulatory Visit: Payer: Self-pay | Admitting: Obstetrics and Gynecology

## 2021-03-04 DIAGNOSIS — N6310 Unspecified lump in the right breast, unspecified quadrant: Secondary | ICD-10-CM

## 2021-03-04 DIAGNOSIS — R2231 Localized swelling, mass and lump, right upper limb: Secondary | ICD-10-CM

## 2021-03-04 IMAGING — MG DIGITAL DIAGNOSTIC BILAT W/ TOMO W/ CAD
6 of 10 series · 6 of 30 positions shown · non-contrast
Comparison: Previous exam(s).

CLINICAL DATA: Patient presents with a palpable lump along the
lateral right breast. She had her retroglandular saline implants
removed in [TE]. She believes that this lateral lump has been stable
since then.

EXAM:
DIGITAL DIAGNOSTIC BILATERAL MAMMOGRAM WITH TOMOSYNTHESIS AND CAD;
ULTRASOUND RIGHT BREAST LIMITED
TECHNIQUE: Bilateral digital diagnostic mammography and breast tomosynthesis
was performed. The images were evaluated with computer-aided
detection.; Targeted ultrasound examination of the right breast was
performed

[L MLO synth-2D]
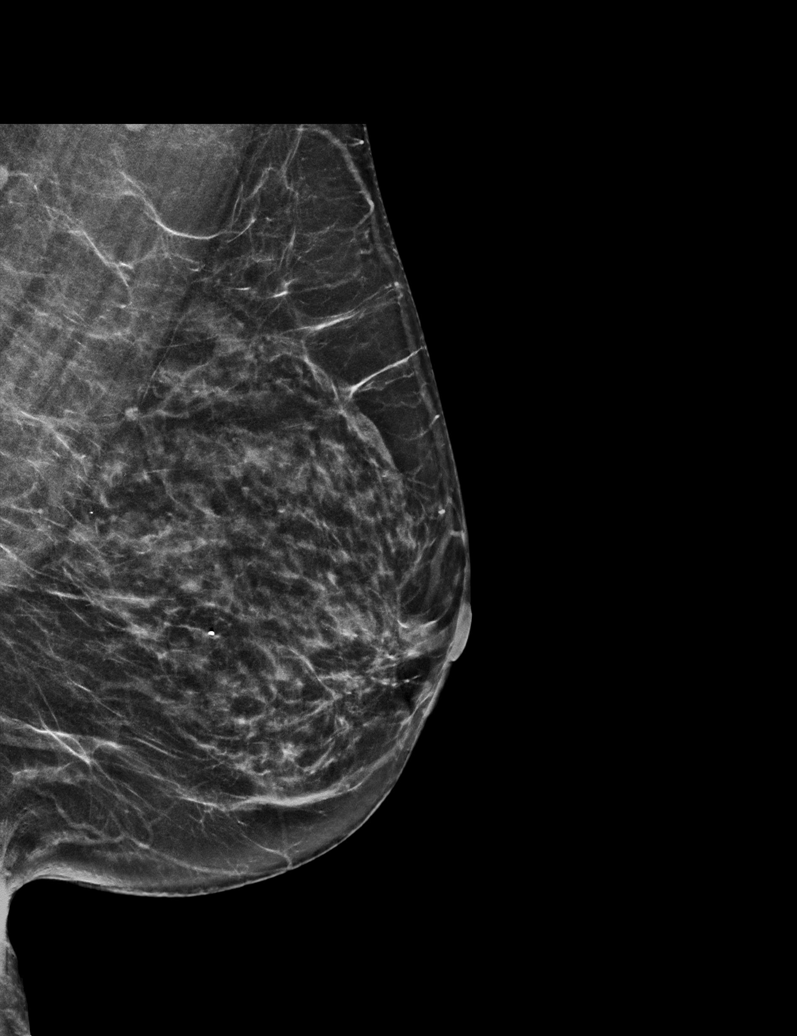

[L CC synth-2D]
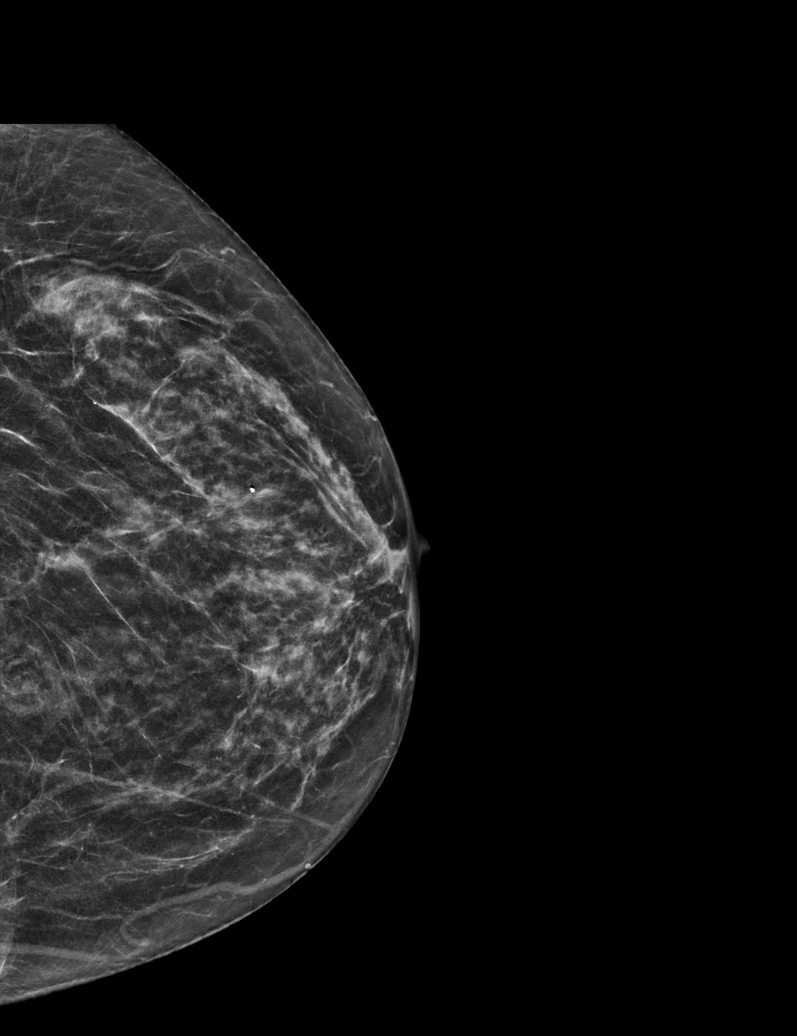

[R MLO synth-2D]
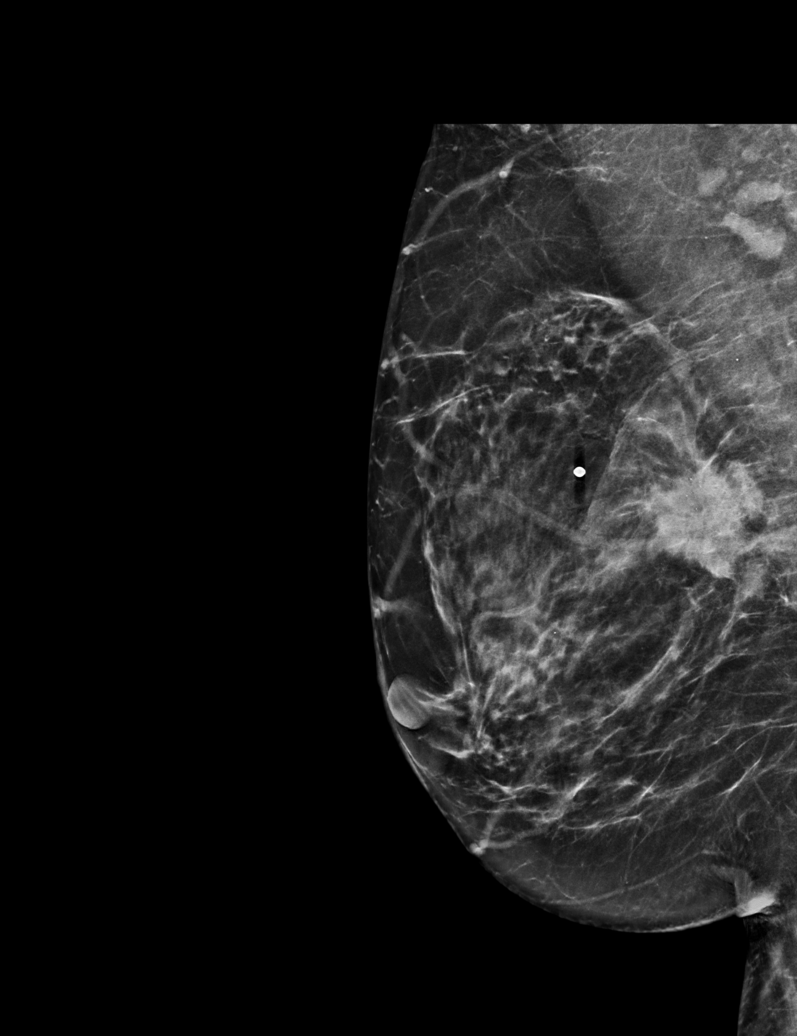

[R CC synth-2D (1 of 2)]
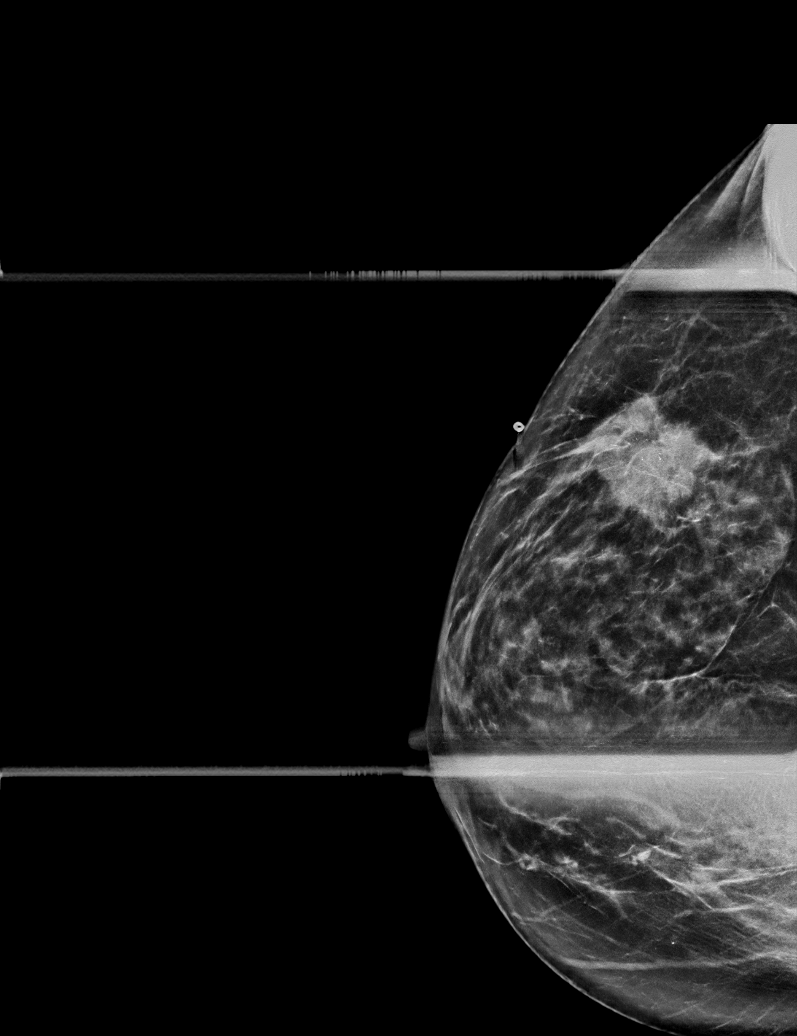

[R CC synth-2D (2 of 2)]
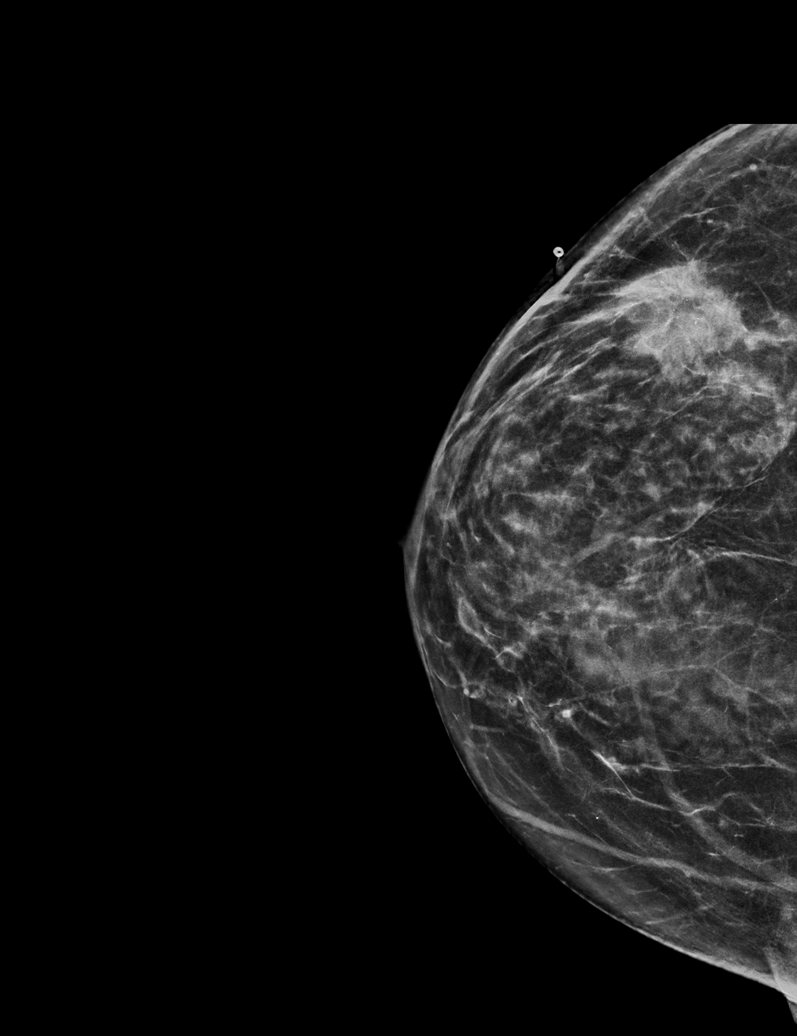

[R MLO tomo · tomo slice 31/60.0]
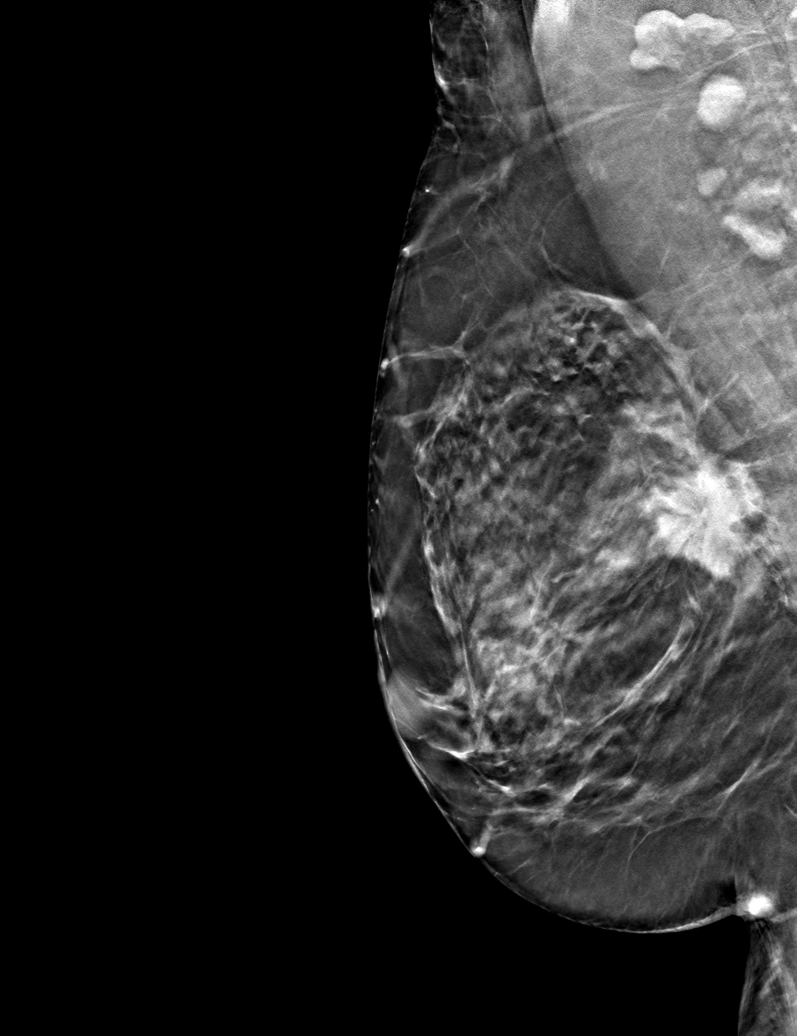

[6 of 30 positions shown; findings below may reference images not displayed]

ACR Breast Density Category c: The breast tissue is heterogeneously
dense, which may obscure small masses.
FINDINGS: There is an irregular mass with associated architectural distortion
and punctate calcifications in the lateral right breast,
corresponding to the palpable abnormality.

There are no other breast masses, no other areas of architectural
distortion and no other suspicious calcifications.

On physical exam, there is a firm but mobile palpable mass in the
upper outer right breast.

Targeted right breast ultrasound is performed, showing an irregular
hypoechoic vascular mass with irregular and ill-defined margins,
centered at 9 o'clock, 5 cm the nipple, corresponding to the
palpable abnormality. The mass measures 2.2 x 2.0 x 2.1 cm.

Sonographic evaluation of the right axilla demonstrates 1 lymph node
with a mildly thickened cortex measuring 4 mm.
IMPRESSION: 1. 2.2 cm highly suspicious mass in the right breast at 9 o'clock.
Tissue sampling is indicated.
2. Single abnormal right axillary lymph node with a mildly thickened
cortex measuring 4 mm. Tissue sampling is recommended.

RECOMMENDATION:
1. Ultrasound-guided core needle biopsy of the 9 o'clock position
right breast mass.
2. Ultrasound-guided core needle biopsy of the single abnormal right
axillary lymph node.

These procedures for scheduled for [DATE].

I have discussed the findings and recommendations with the patient.
If applicable, a reminder letter will be sent to the patient
regarding the next appointment.

BI-RADS CATEGORY  5: Highly suggestive of malignancy.

## 2021-03-04 IMAGING — US US BREAST*R* LIMITED INC AXILLA
1 series · 13 of 13 positions shown · non-contrast
Comparison: Previous exam(s).

CLINICAL DATA: Patient presents with a palpable lump along the
lateral right breast. She had her retroglandular saline implants
removed in [TE]. She believes that this lateral lump has been stable
since then.

EXAM:
DIGITAL DIAGNOSTIC BILATERAL MAMMOGRAM WITH TOMOSYNTHESIS AND CAD;
ULTRASOUND RIGHT BREAST LIMITED
TECHNIQUE: Bilateral digital diagnostic mammography and breast tomosynthesis
was performed. The images were evaluated with computer-aided
detection.; Targeted ultrasound examination of the right breast was
performed

[Series 1: us breast*right* limited inc axilla · 0.07mm/px · 13 of 13 slices shown]
[im 1/13]
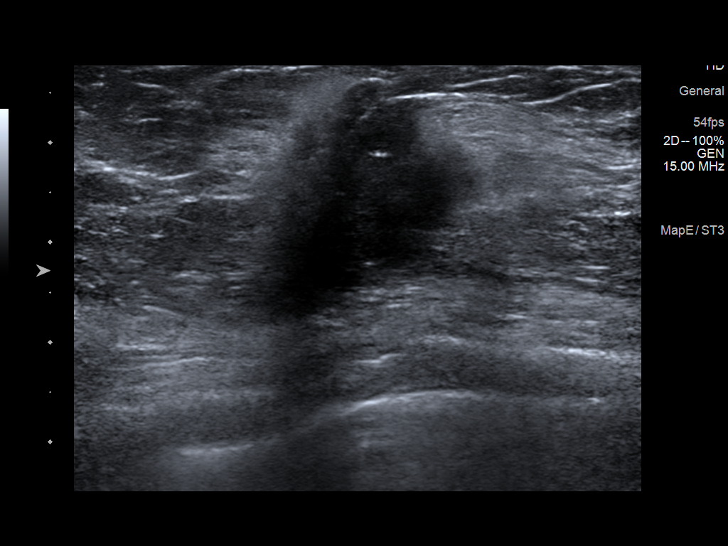
[im 2/13]
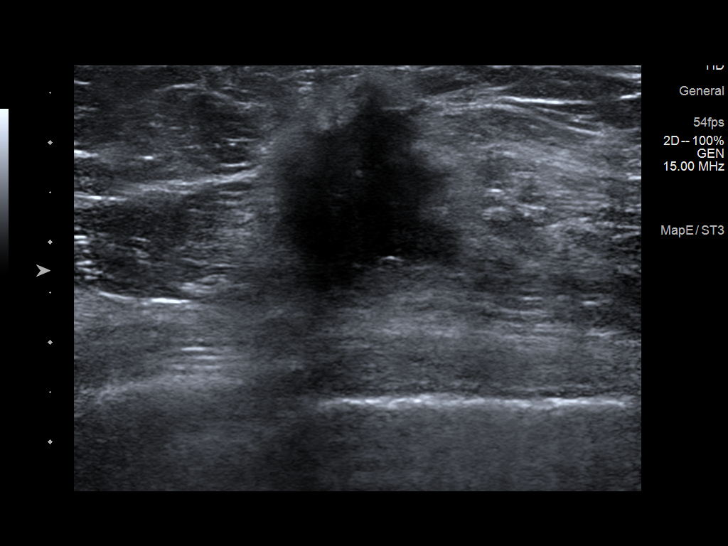
[im 3/13]
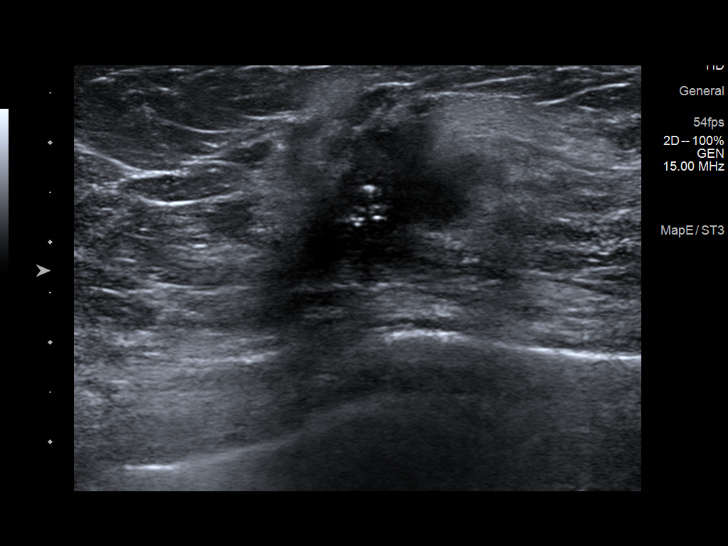
[im 4/13]
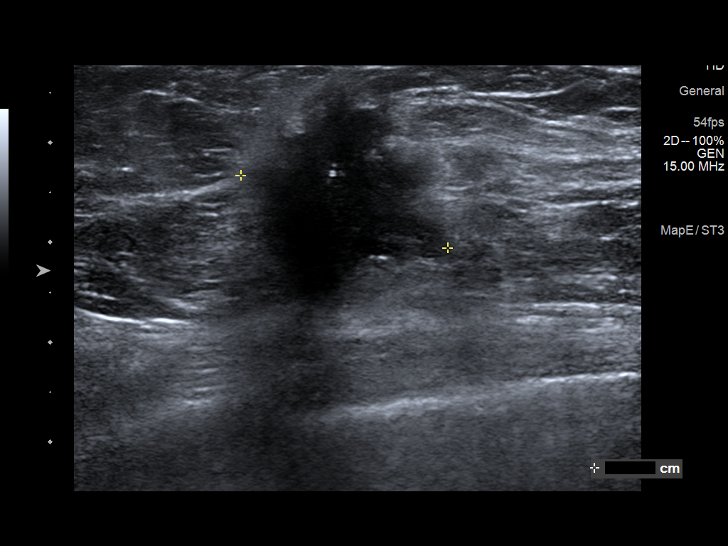
[im 5/13]
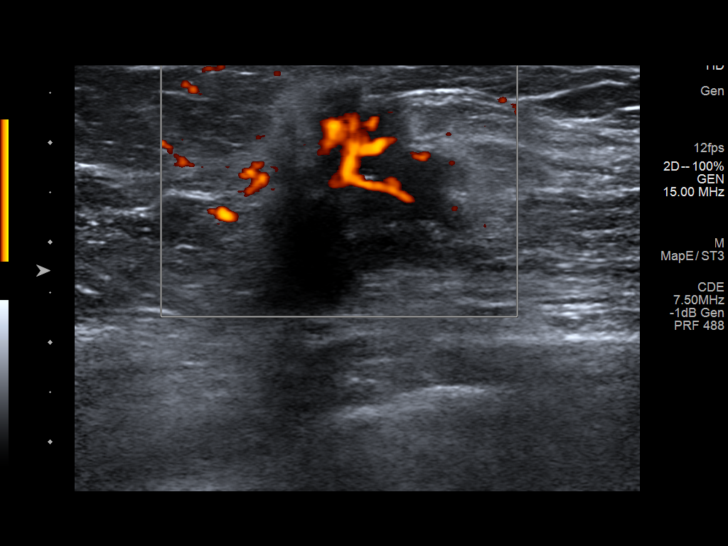
[im 6/13]
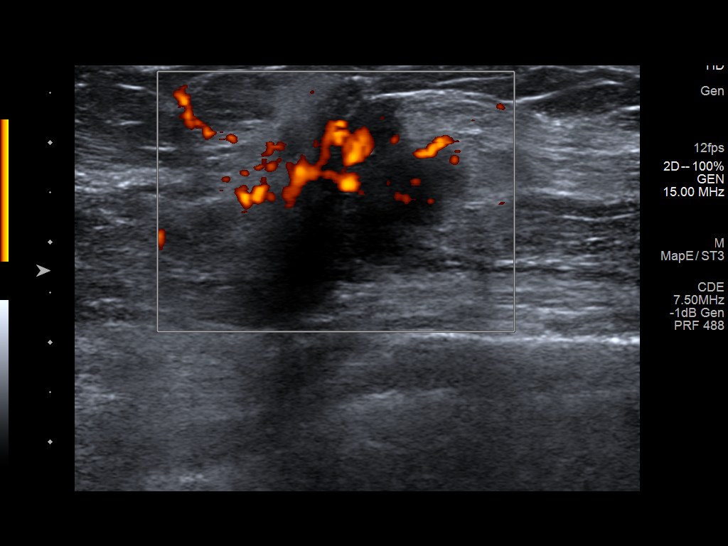
[im 7/13]
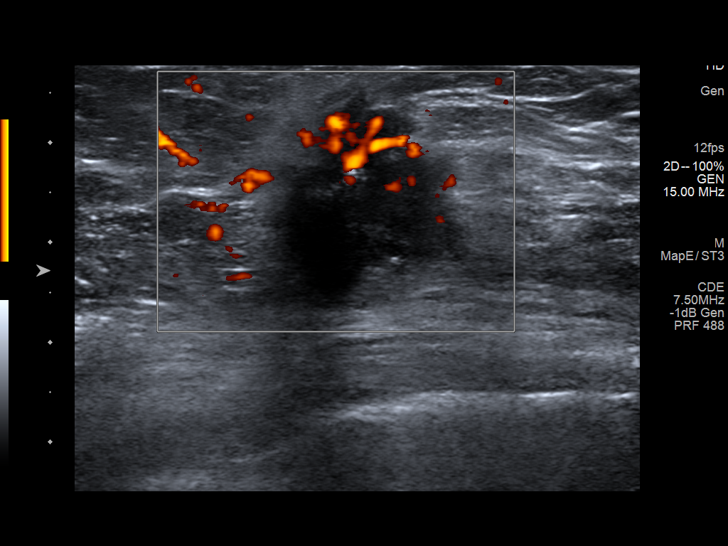
[im 8/13]
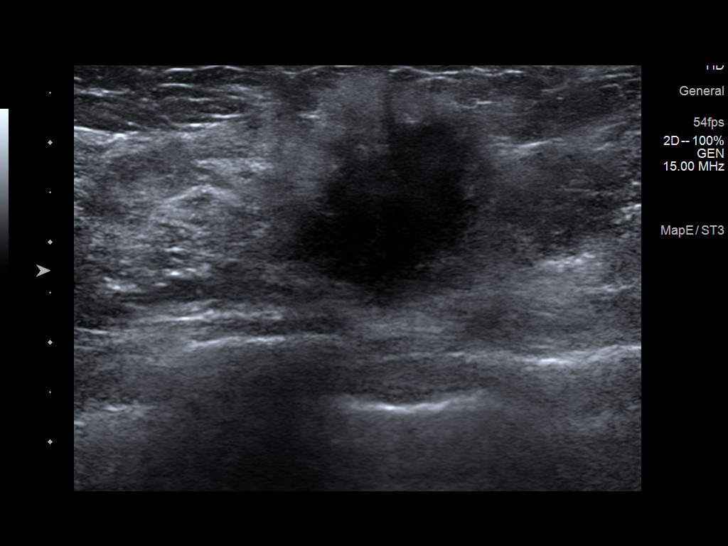
[im 9/13]
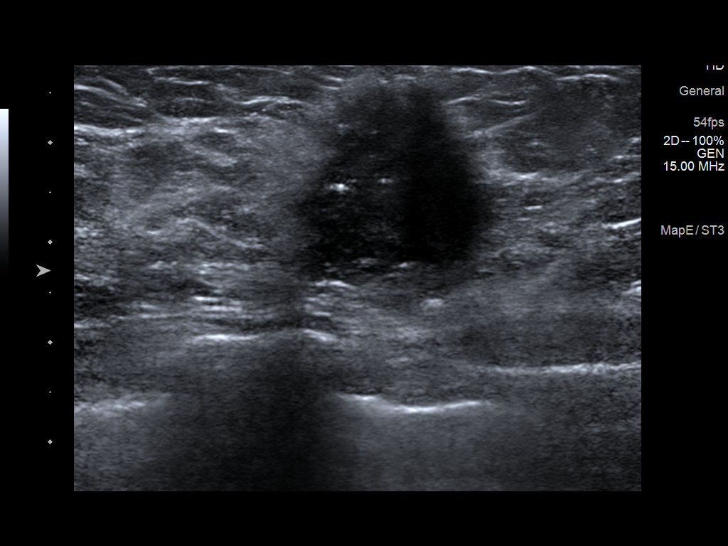
[im 10/13]
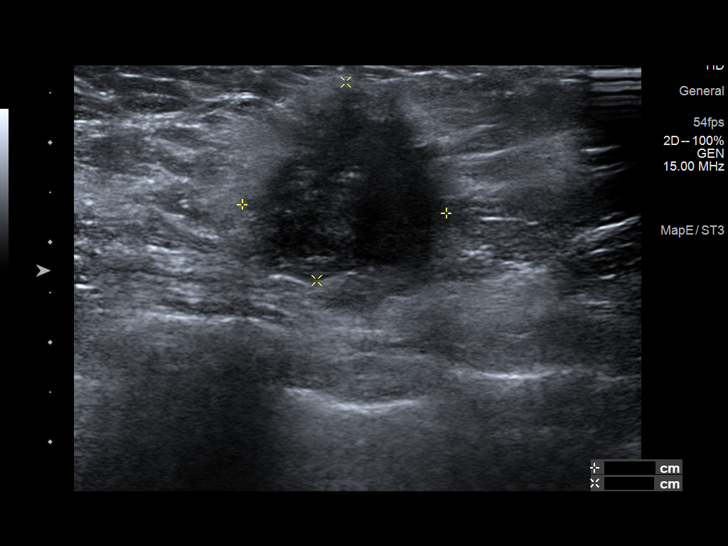
[im 11/13]
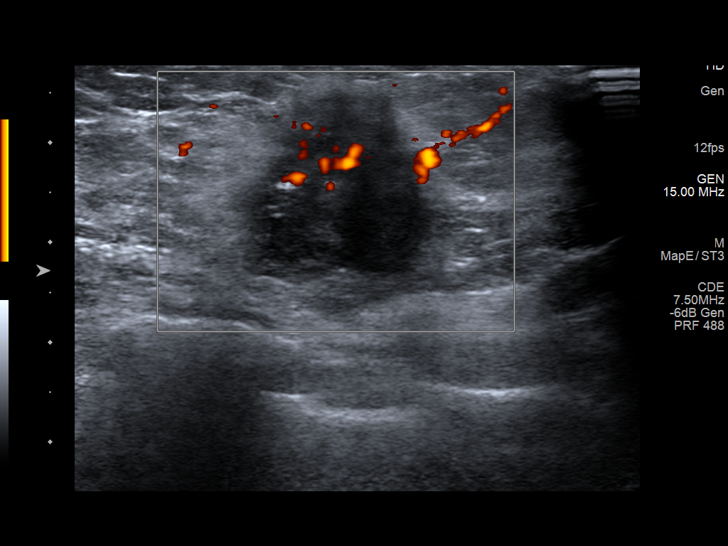
[im 12/13]
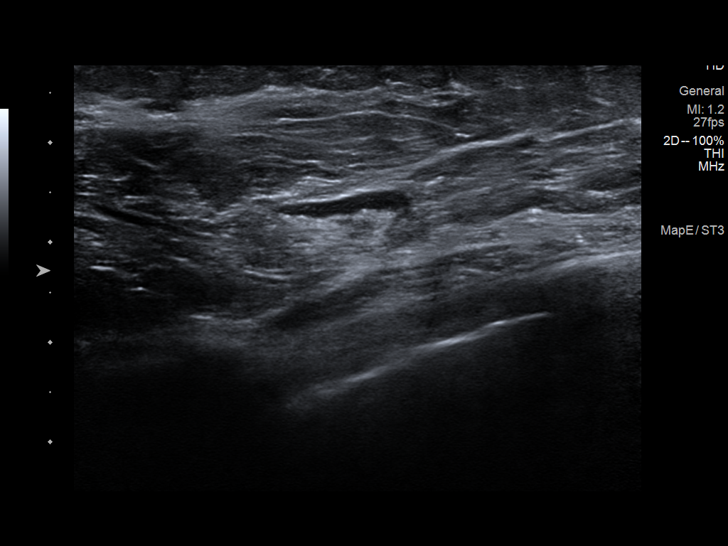
[im 13/13]
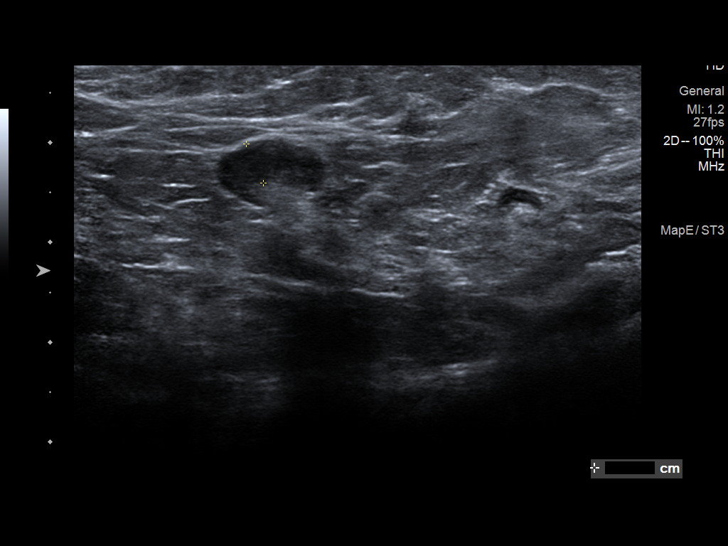

[13 of 13 positions shown; findings below may reference images not displayed]

ACR Breast Density Category c: The breast tissue is heterogeneously
dense, which may obscure small masses.
FINDINGS: There is an irregular mass with associated architectural distortion
and punctate calcifications in the lateral right breast,
corresponding to the palpable abnormality.

There are no other breast masses, no other areas of architectural
distortion and no other suspicious calcifications.

On physical exam, there is a firm but mobile palpable mass in the
upper outer right breast.

Targeted right breast ultrasound is performed, showing an irregular
hypoechoic vascular mass with irregular and ill-defined margins,
centered at 9 o'clock, 5 cm the nipple, corresponding to the
palpable abnormality. The mass measures 2.2 x 2.0 x 2.1 cm.

Sonographic evaluation of the right axilla demonstrates 1 lymph node
with a mildly thickened cortex measuring 4 mm.
IMPRESSION: 1. 2.2 cm highly suspicious mass in the right breast at 9 o'clock.
Tissue sampling is indicated.
2. Single abnormal right axillary lymph node with a mildly thickened
cortex measuring 4 mm. Tissue sampling is recommended.

RECOMMENDATION:
1. Ultrasound-guided core needle biopsy of the 9 o'clock position
right breast mass.
2. Ultrasound-guided core needle biopsy of the single abnormal right
axillary lymph node.

These procedures for scheduled for [DATE].

I have discussed the findings and recommendations with the patient.
If applicable, a reminder letter will be sent to the patient
regarding the next appointment.

BI-RADS CATEGORY  5: Highly suggestive of malignancy.

## 2021-03-09 ENCOUNTER — Ambulatory Visit
Admission: RE | Admit: 2021-03-09 | Discharge: 2021-03-09 | Disposition: A | Discharge status: Home / Self Care | Payer: No Typology Code available for payment source | Source: Ambulatory Visit | Attending: Obstetrics and Gynecology | Admitting: Obstetrics and Gynecology

## 2021-03-09 DIAGNOSIS — N6310 Unspecified lump in the right breast, unspecified quadrant: Secondary | ICD-10-CM

## 2021-03-09 IMAGING — MG MM BREAST LOCALIZATION CLIP
6 series · 6 of 18 positions shown · non-contrast
Comparison: Previous exam(s).

CLINICAL DATA: Post biopsy mammogram of the right breast for clip
placement.

EXAM:
3D DIAGNOSTIC RIGHT MAMMOGRAM POST ULTRASOUND BIOPSY

[R MLO synth-2D]
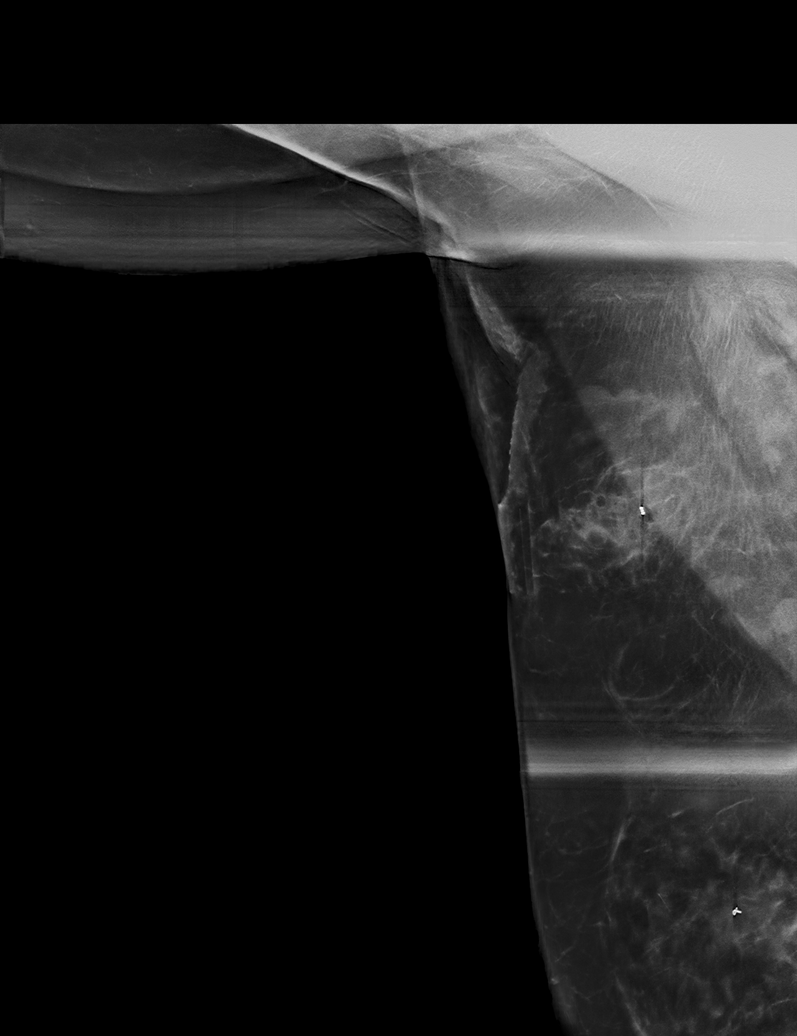

[R CC synth-2D]
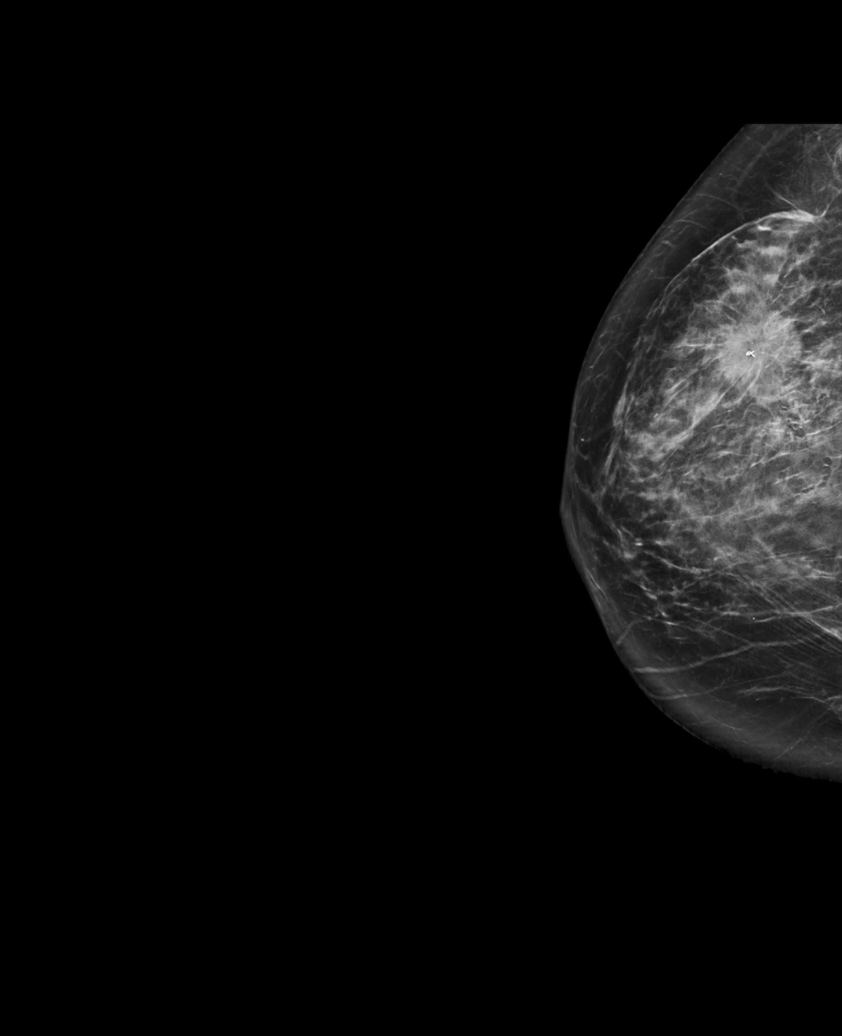

[R ML synth-2D]
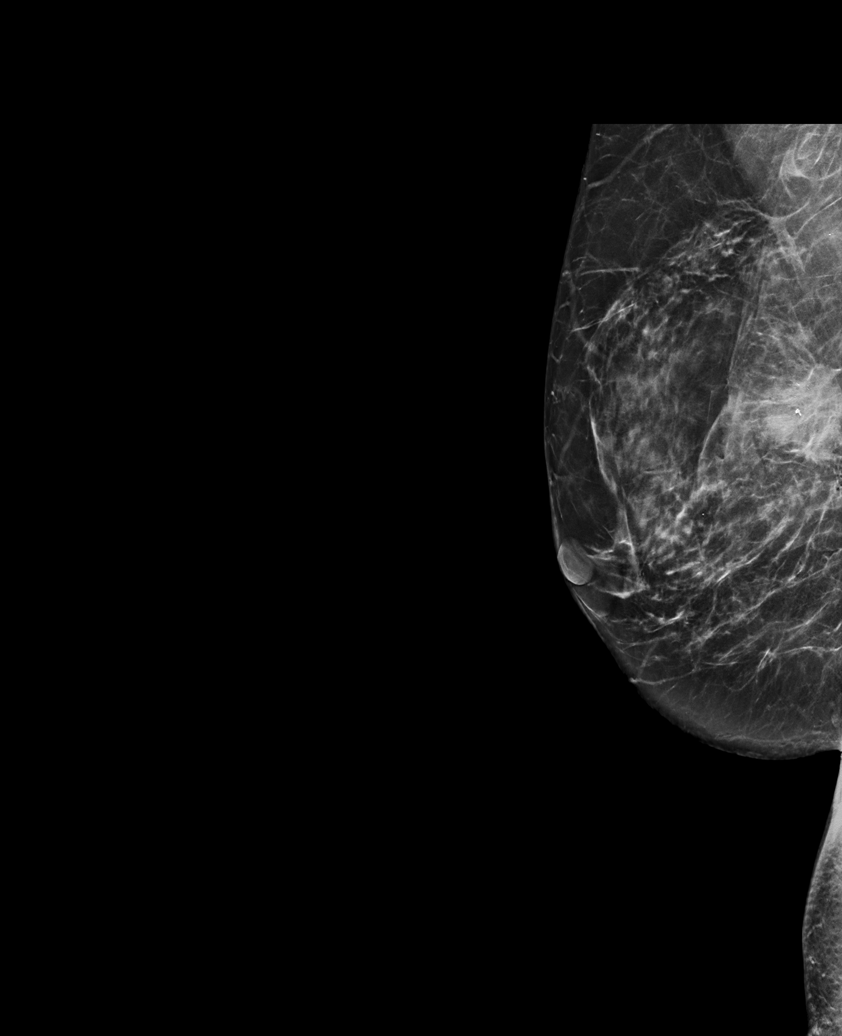

[R ML tomo · tomo slice 35/68.0]
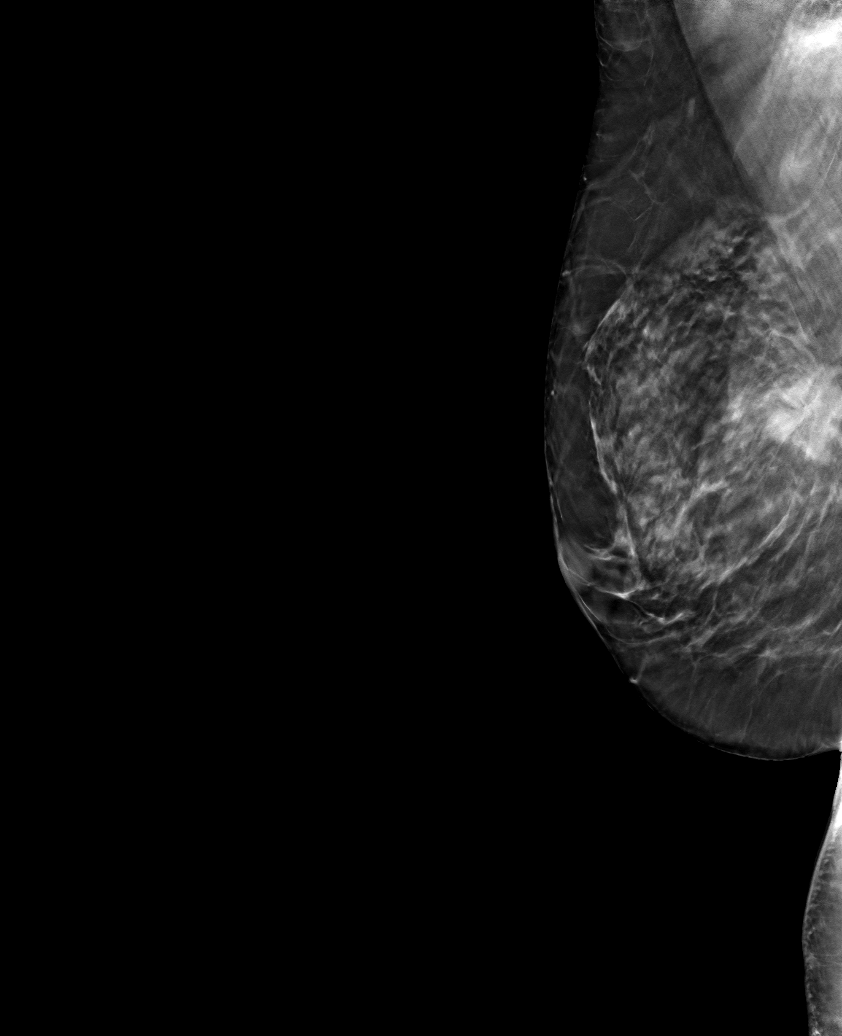

[R CC tomo · tomo slice 35/69.0]
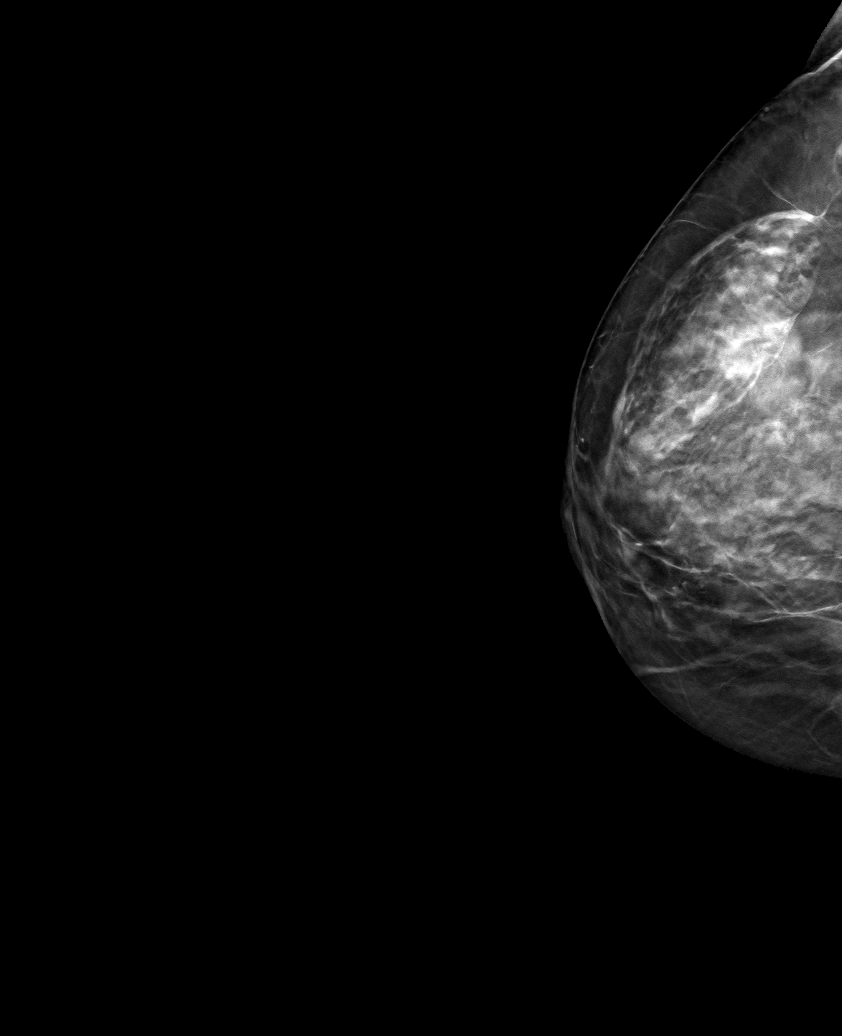

[R MLO tomo · tomo slice 45/90.0]
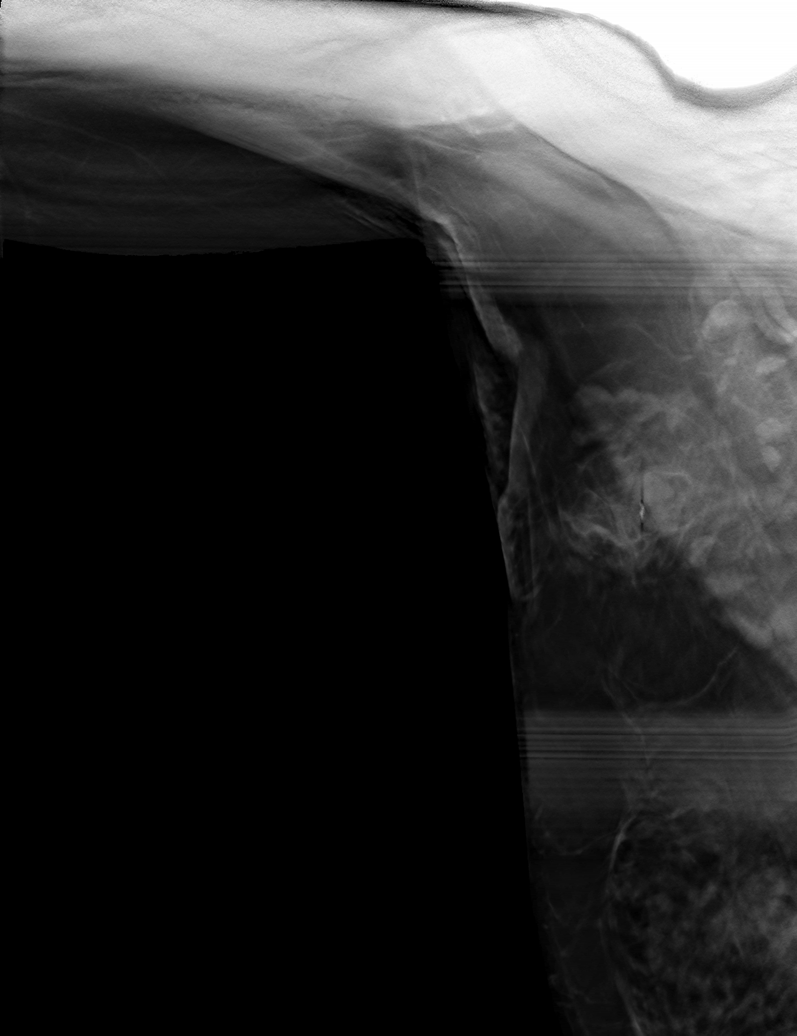

[6 of 18 positions shown; findings below may reference images not displayed]

FINDINGS: 3D Mammographic images were obtained following ultrasound guided
biopsy of a right breast mass and a right axillary lymph node. The
biopsy marking clips are in expected position at the sites of
biopsy.
IMPRESSION: 1. Appropriate positioning of the ribbon shaped biopsy marking clip
at the site of biopsy in the lateral right breast.

2. Appropriate positioning of the tribell shaped biopsy marking clip
in the right axilla.

Final Assessment: Post Procedure Mammograms for Marker Placement

## 2021-03-09 IMAGING — US US  BREAST BX W/ LOC DEV 1ST LESION IMG BX SPEC US GUIDE*R*
1 series · 12 of 21 positions shown · non-contrast
Comparison: Previous exam(s).
COMPARISON: Previous exam(s).

Addendum:
CLINICAL DATA: 60-year-old female presenting for ultrasound-guided
biopsy of a right breast mass and right axillary lymph node.

EXAM:
ULTRASOUND GUIDED RIGHT BREAST CORE NEEDLE BIOPSY

[Series 1: us breast bx w/ loc dev 1st lesion img bx spec us  · 0.06mm/px · 12 of 21 slices shown]
[im 1/21]
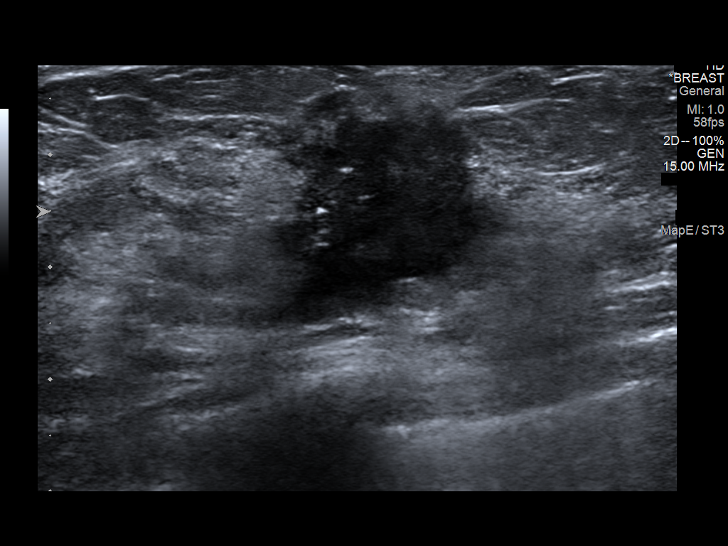
[im 3/21]
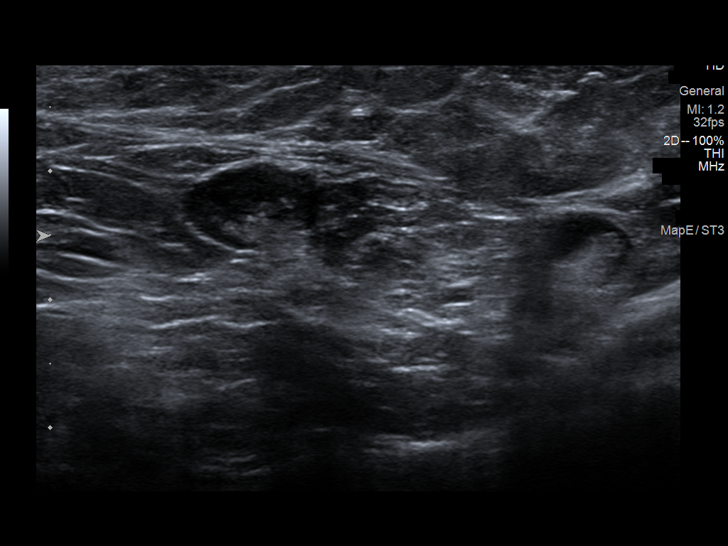
[im 5/21]
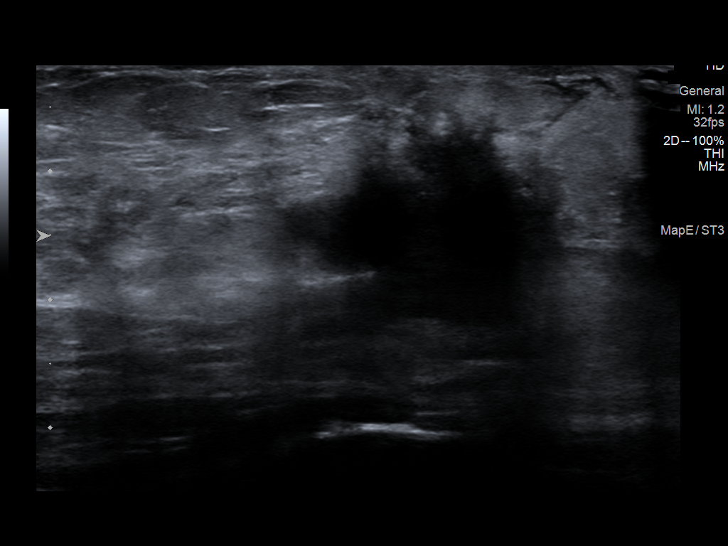
[im 7/21]
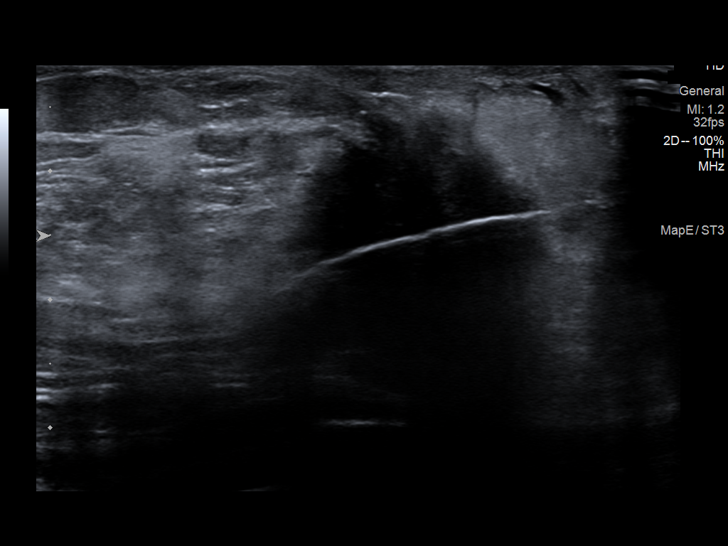
[im 8/21]
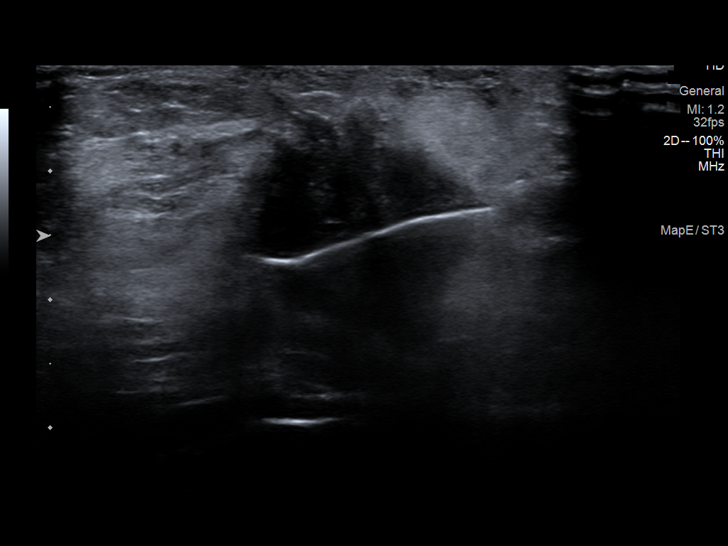
[im 10/21]
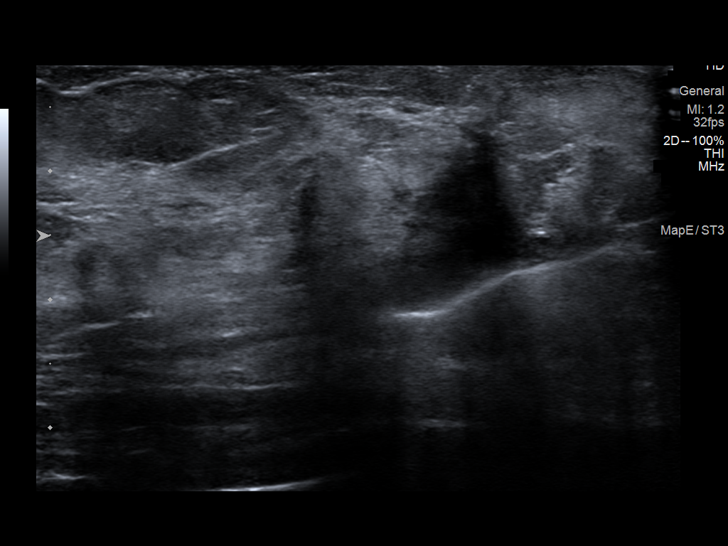
[im 12/21]
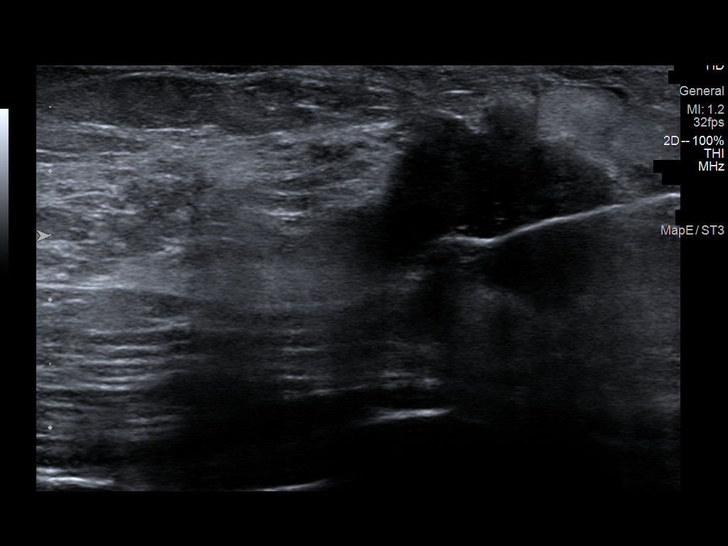
[im 14/21]
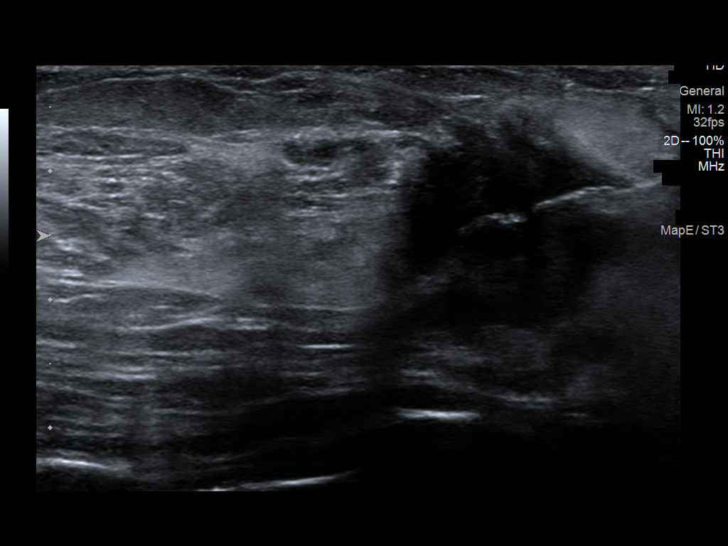
[im 15/21]
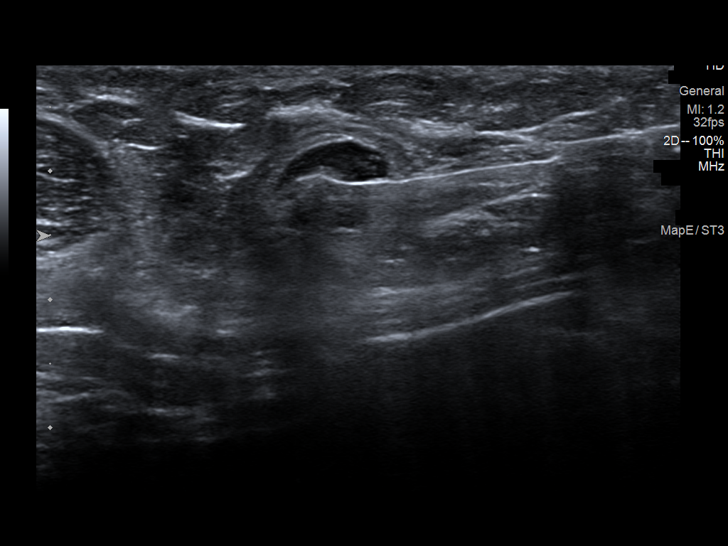
[im 17/21]
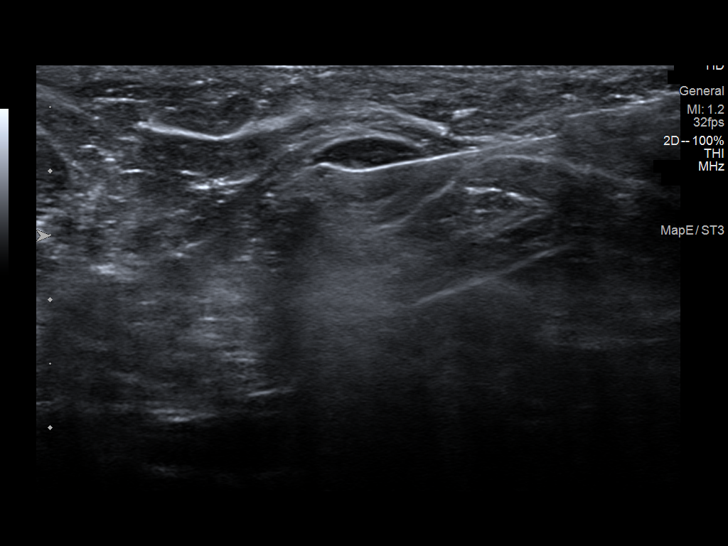
[im 19/21]
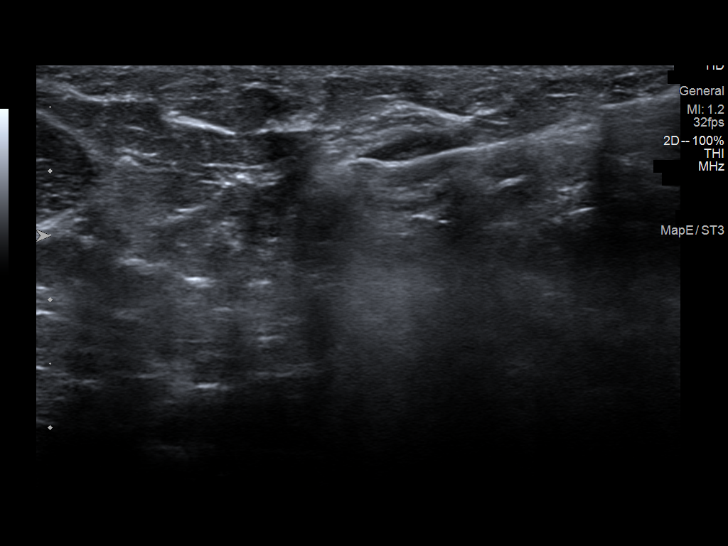
[im 21/21]
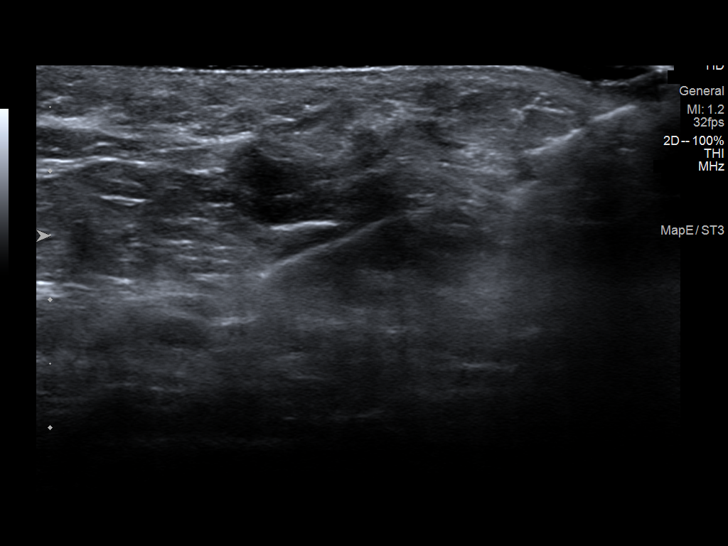

[12 of 21 positions shown; findings below may reference images not displayed]



Lesion quadrant: Upper outer quadrant

Using sterile technique and 1% Lidocaine as local anesthetic, under
direct ultrasound visualization, a 14 gauge MORASHO device was
used to perform biopsy of a mass in the right breast at 9 o'clock, 5
cm from the nipple using a lateral approach. At the conclusion of
the procedure a ribbon shaped tissue marker clip was deployed into
the biopsy cavity.

Lesion quadrant: Right axilla

Using sterile technique and 1% Lidocaine as local anesthetic, under
direct ultrasound visualization, a 14 gauge MORASHO device was
used to perform biopsy of a right axillary lymph node using an
inferior approach. At the conclusion of the procedure a tribell
tissue marker clip was deployed into the biopsy cavity. Follow up 2
view mammogram was performed and dictated separately.
IMPRESSION: 1. Ultrasound guided biopsy of right breast mass at 9 o'clock. No
apparent complications.

2. Ultrasound-guided biopsy of a right axillary lymph node. No
apparent complications.

ADDENDUM:
Pathology revealed GRADE II INVASIVE DUCTAL CARCINOMA of the RIGHT
breast, 9 o'clock, [FE] (ribbon clip). This was found to be
concordant by Dr. MORASHO.

Pathology revealed NO CARCINOMA IDENTIFIED IN NODAL TISSUE of the
RIGHT axillary lymph node (tribell clip). This was found to be
concordant by Dr. MORASHO.

Pathology results were discussed with the patient by telephone. The
patient reported doing well after the biopsies with tenderness at
the sites. Post biopsy instructions and care were reviewed and
questions were answered. The patient was encouraged to call The

The patient was referred to [REDACTED]
[REDACTED] at [REDACTED] on
[DATE].

Pathology results reported by MORASHO RN on [DATE].



Lesion quadrant: Upper outer quadrant

Using sterile technique and 1% Lidocaine as local anesthetic, under
direct ultrasound visualization, a 14 gauge MORASHO device was
used to perform biopsy of a mass in the right breast at 9 o'clock, 5
cm from the nipple using a lateral approach. At the conclusion of
the procedure a ribbon shaped tissue marker clip was deployed into
the biopsy cavity.

Lesion quadrant: Right axilla

Using sterile technique and 1% Lidocaine as local anesthetic, under
direct ultrasound visualization, a 14 gauge MORASHO device was
used to perform biopsy of a right axillary lymph node using an
inferior approach. At the conclusion of the procedure a tribell
tissue marker clip was deployed into the biopsy cavity. Follow up 2
view mammogram was performed and dictated separately.
IMPRESSION: 1. Ultrasound guided biopsy of right breast mass at 9 o'clock. No
apparent complications.

2. Ultrasound-guided biopsy of a right axillary lymph node. No
apparent complications.

## 2021-03-10 ENCOUNTER — Telehealth: Payer: Self-pay | Admitting: Hematology and Oncology

## 2021-03-10 ENCOUNTER — Other Ambulatory Visit: Payer: No Typology Code available for payment source

## 2021-03-10 NOTE — Telephone Encounter (Signed)
Spoke to patient to confirm morning clinic appointment for 2/22, packet will be sent via e-mail

## 2021-03-12 ENCOUNTER — Encounter: Payer: Self-pay | Admitting: *Deleted

## 2021-03-15 NOTE — Progress Notes (Signed)
Radiation Oncology         [phone]) [phone] ________________________________  Name: [name] [name]        MRN: [id number]  Date of Service: 03/17/2021 DOB: [birth year]  [id number], No Pcp Per (Inactive)  Stark Klein, MD     REFERRING PHYSICIAN: Stark Klein, MD   DIAGNOSIS: The encounter diagnosis was Malignant neoplasm of upper-outer quadrant of right breast in female, estrogen receptor positive ([place]).   HISTORY OF PRESENT ILLNESS: [name] [name] is a 61 y.o. female seen in the multidisciplinary breast clinic for a new diagnosis of right breast cancer. The patient was noted to have a palpable mass in the right breast. Of note she had previous saline implants removed about a year ago. The palpable area by diagnostic imaging meausred 2.2 cm in the 9:00 position of the right breast and one abnormal appearing axillary node. She underwent biopsies. Her node was benign and concordant. 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 She's seen today to discuss treatment recommendations of her cancer.     PREVIOUS RADIATION THERAPY: No   PAST MEDICAL HISTORY:  Past Medical History:  Diagnosis Date   Abnormal bleeding in menstrual cycle    Abnormal Pap smear of vagina    Breast cancer (HCC)    Endometrial polyp    Hot flashes    Irregular heart beats    Typical atrial flutter ([place]) 01/01/2019       PAST SURGICAL HISTORY: Past Surgical History:  Procedure Laterality Date   CHOLECYSTECTOMY     SHOULDER SURGERY     Rotator cuff repair     FAMILY HISTORY:  Family History  Problem Relation Age of Onset   Heart attack Mother      SOCIAL HISTORY:  reports that she has never smoked. She has never used smokeless tobacco. She reports current alcohol use of about 14.0 standard drinks per week. She reports that she does not currently use drugs. The patient is married and lives in [place].  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  ALLERGIES: Bee venom   MEDICATIONS:  Current Outpatient Medications  Medication Sig Dispense Refill   aspirin EC 81 MG tablet Take 81 mg by mouth daily.      Cholecalciferol (VITAMIN D3) 125 MCG (5000 UT) CAPS Take 1,000 Units by mouth daily.     metoprolol succinate (TOPROL XL) 25 MG 24 hr tablet Take 0.5 tablets (12.5 mg total) by mouth daily. (Patient not taking: Reported on 03/17/2021) 45 tablet 1   Probiotic Product (PROBIOTIC DAILY PO) Take 1 tablet by mouth daily.      No current facility-administered medications for this encounter.     REVIEW OF SYSTEMS: On review of systems, the patient reports that she is doing okay overall. She is trying to get out of town today to fly to [place]. She does not have any breast specific complaints.      PHYSICAL EXAM:  Wt Readings from Last 3 Encounters:  03/17/21 150 lb (68 kg)  09/09/19 147 lb (66.7 kg)  01/03/19 156 lb (70.8 kg)   Temp Readings from Last 3 Encounters:  03/17/21 (!) 97.3 F (36.3 C) (Tympanic)  09/09/19 98.5 F (36.9 C)   BP Readings from Last 3 Encounters:  03/17/21 (!) 172/92  09/09/19 (!) 153/101  01/03/19 126/80   Pulse Readings from Last 3 Encounters:  03/17/21 71  09/09/19 86  01/03/19 70  In general this is a well appearing caucasian female in no acute distress. She's alert and oriented x4 and appropriate throughout the examination. Cardiopulmonary assessment is negative for acute distress and she exhibits normal effort. Bilateral breast exam is deferred.    ECOG = 0  0 - Asymptomatic (Fully active, able to carry on all predisease activities without restriction)  1 - Symptomatic but completely ambulatory (Restricted in physically strenuous activity but ambulatory and able to carry out work of a light or sedentary nature. For example, light housework, office work)  2 - Symptomatic, <50% in bed during the day (Ambulatory and capable of all self care but unable to carry out  any work activities. Up and about more than 50% of waking hours)  3 - Symptomatic, >50% in bed, but not bedbound (Capable of only limited self-care, confined to bed or chair 50% or more of waking hours)  4 - Bedbound (Completely disabled. Cannot carry on any self-care. Totally confined to bed or chair)  5 - Death   Eustace Pen MM, Creech RH, Tormey DC, et al. [phone]). "Toxicity and response criteria of the [facility]". [place] Oncol. 5 (6): 649-55    LABORATORY DATA:  Lab Results  Component Value Date   WBC 5.5 03/17/2021   HGB 12.4 03/17/2021   HCT 36.8 03/17/2021   MCV 90.9 03/17/2021   PLT 253 03/17/2021   Lab Results  Component Value Date   NA 138 03/17/2021   K 3.8 03/17/2021   CL 105 03/17/2021   CO2 27 03/17/2021   Lab Results  Component Value Date   ALT 16 03/17/2021   AST 17 03/17/2021   ALKPHOS 51 03/17/2021   BILITOT 0.4 03/17/2021      RADIOGRAPHY: US BREAST LTD UNI RIGHT INC AXILLA  Result Date: 03/04/2021 CLINICAL DATA:  Patient presents with a palpable lump along the lateral right breast. She had her retroglandular saline implants removed in 2021. She believes that this lateral lump has been stable since then. EXAM: DIGITAL DIAGNOSTIC BILATERAL MAMMOGRAM WITH TOMOSYNTHESIS AND CAD; ULTRASOUND RIGHT BREAST LIMITED TECHNIQUE: Bilateral digital diagnostic mammography and breast tomosynthesis was performed. The images were evaluated with computer-aided detection.; Targeted ultrasound examination of the right breast was performed COMPARISON:  Previous exam(s). ACR Breast Density Category c: The breast tissue is heterogeneously dense, which may obscure small masses. FINDINGS: There is an irregular mass with associated architectural distortion and punctate calcifications in the lateral right breast, corresponding to the palpable abnormality. There are no other breast masses, no other areas of architectural distortion and no other suspicious  calcifications. On physical exam, there is a firm but mobile palpable mass in the upper outer right breast. Targeted right breast ultrasound is performed, showing an irregular hypoechoic vascular mass with irregular and ill-defined margins, centered at 9 o'clock, 5 cm the nipple, corresponding to the palpable abnormality. The mass measures 2.2 x 2.0 x 2.1 cm. Sonographic evaluation of the right axilla demonstrates 1 lymph node with a mildly thickened cortex measuring 4 mm. IMPRESSION: 1. 2.2 cm highly suspicious mass in the right breast at 9 o'clock. Tissue sampling is indicated. 2. Single abnormal right axillary lymph node with a mildly thickened cortex measuring 4 mm. Tissue sampling is recommended. RECOMMENDATION: 1. Ultrasound-guided core needle biopsy of the 9 o'clock position right breast mass. 2. Ultrasound-guided core needle biopsy of the single abnormal right axillary lymph node. These procedures for scheduled for 03/09/2021. I have discussed the findings and recommendations with the  patient. If applicable, a reminder letter will be sent to the patient regarding the next appointment. BI-RADS CATEGORY  5: Highly suggestive of malignancy. Electronically Signed   By: Lajean Manes M.D.   On: 03/04/2021 12:25  MM DIAG BREAST TOMO BILATERAL  Result Date: 03/04/2021 CLINICAL DATA:  Patient presents with a palpable lump along the lateral right breast. She had her retroglandular saline implants removed in 2021. She believes that this lateral lump has been stable since then. EXAM: DIGITAL DIAGNOSTIC BILATERAL MAMMOGRAM WITH TOMOSYNTHESIS AND CAD; ULTRASOUND RIGHT BREAST LIMITED TECHNIQUE: Bilateral digital diagnostic mammography and breast tomosynthesis was performed. The images were evaluated with computer-aided detection.; Targeted ultrasound examination of the right breast was performed COMPARISON:  Previous exam(s). ACR Breast Density Category c: The breast tissue is heterogeneously dense, which may obscure  small masses. FINDINGS: There is an irregular mass with associated architectural distortion and punctate calcifications in the lateral right breast, corresponding to the palpable abnormality. There are no other breast masses, no other areas of architectural distortion and no other suspicious calcifications. On physical exam, there is a firm but mobile palpable mass in the upper outer right breast. Targeted right breast ultrasound is performed, showing an irregular hypoechoic vascular mass with irregular and ill-defined margins, centered at 9 o'clock, 5 cm the nipple, corresponding to the palpable abnormality. The mass measures 2.2 x 2.0 x 2.1 cm. Sonographic evaluation of the right axilla demonstrates 1 lymph node with a mildly thickened cortex measuring 4 mm. IMPRESSION: 1. 2.2 cm highly suspicious mass in the right breast at 9 o'clock. Tissue sampling is indicated. 2. Single abnormal right axillary lymph node with a mildly thickened cortex measuring 4 mm. Tissue sampling is recommended. RECOMMENDATION: 1. Ultrasound-guided core needle biopsy of the 9 o'clock position right breast mass. 2. Ultrasound-guided core needle biopsy of the single abnormal right axillary lymph node. These procedures for scheduled for 03/09/2021. I have discussed the findings and recommendations with the patient. If applicable, a reminder letter will be sent to the patient regarding the next appointment. BI-RADS CATEGORY  5: Highly suggestive of malignancy. Electronically Signed   By: Lajean Manes M.D.   On: 03/04/2021 12:25  [place] AXILLARY NODE CORE BIOPSY RIGHT  Addendum Date: 03/10/2021   ADDENDUM REPORT: 03/10/2021 13:18 ADDENDUM: Pathology revealed GRADE II INVASIVE DUCTAL CARCINOMA of the RIGHT breast, 9 o'clock, 5cmfn (ribbon clip). This was found to be concordant by Dr. Ammie Ferrier. Pathology revealed NO CARCINOMA IDENTIFIED IN NODAL TISSUE of the RIGHT axillary lymph node (tribell clip). This was found to be concordant by  Dr. Ammie Ferrier. Pathology results were discussed with the patient by telephone. The patient reported doing well after the biopsies with tenderness at the sites. Post biopsy instructions and care were reviewed and questions were answered. The patient was encouraged to call [facility] for any additional concerns. The patient was referred to [facility] at [facility] on March 17, 2021. Pathology results reported by Stacie Acres RN on 03/10/2021. Electronically Signed   By: Ammie Ferrier M.D.   On: 03/10/2021 13:18   Result Date: 03/10/2021 CLINICAL DATA:  61 year old female presenting for ultrasound-guided biopsy of a right breast mass and right axillary lymph node. EXAM: ULTRASOUND GUIDED RIGHT BREAST CORE NEEDLE BIOPSY COMPARISON:  Previous exam(s). PROCEDURE: I met with the patient and we discussed the procedure of ultrasound-guided biopsy, including benefits and alternatives. We discussed the high likelihood of a successful procedure.  We discussed the risks of the procedure, including infection, bleeding, tissue injury, clip migration, and inadequate sampling. Informed written consent was given. The usual time-out protocol was performed immediately prior to the procedure. Lesion quadrant: Upper outer quadrant Using sterile technique and 1% Lidocaine as local anesthetic, under direct ultrasound visualization, a 14 gauge spring-loaded device was used to perform biopsy of a mass in the right breast at 9 o'clock, 5 cm from the nipple using a lateral approach. At the conclusion of the procedure a ribbon shaped tissue marker clip was deployed into the biopsy cavity. Lesion quadrant: Right axilla Using sterile technique and 1% Lidocaine as local anesthetic, under direct ultrasound visualization, a 14 gauge spring-loaded device was used to perform biopsy of a right axillary lymph node using an inferior approach. At the  conclusion of the procedure a tribell tissue marker clip was deployed into the biopsy cavity. Follow up 2 view mammogram was performed and dictated separately. IMPRESSION: 1. Ultrasound guided biopsy of right breast mass at 9 o'clock. No apparent complications. 2. Ultrasound-guided biopsy of a right axillary lymph node. No apparent complications. Electronically Signed: By: Ammie Ferrier M.D. On: 03/09/2021 15:33  MM CLIP PLACEMENT RIGHT  Result Date: 03/09/2021 CLINICAL DATA:  Post biopsy mammogram of the right breast for clip placement. EXAM: 3D DIAGNOSTIC RIGHT MAMMOGRAM POST ULTRASOUND BIOPSY COMPARISON:  Previous exam(s). FINDINGS: 3D Mammographic images were obtained following ultrasound guided biopsy of a right breast mass and a right axillary lymph node. The biopsy marking clips are in expected position at the sites of biopsy. IMPRESSION: 1. Appropriate positioning of the ribbon shaped biopsy marking clip at the site of biopsy in the lateral right breast. 2. Appropriate positioning of the tribell shaped biopsy marking clip in the right axilla. Final Assessment: Post Procedure Mammograms for Marker Placement Electronically Signed   By: Ammie Ferrier M.D.   On: 03/09/2021 15:53  [place] RT BREAST BX W LOC DEV 1ST LESION IMG BX SPEC US GUIDE  Addendum Date: 03/10/2021   ADDENDUM REPORT: 03/10/2021 13:18 ADDENDUM: Pathology revealed GRADE II INVASIVE DUCTAL CARCINOMA of the RIGHT breast, 9 o'clock, 5cmfn (ribbon clip). This was found to be concordant by Dr. Ammie Ferrier. Pathology revealed NO CARCINOMA IDENTIFIED IN NODAL TISSUE of the RIGHT axillary lymph node (tribell clip). This was found to be concordant by Dr. Ammie Ferrier. Pathology results were discussed with the patient by telephone. The patient reported doing well after the biopsies with tenderness at the sites. Post biopsy instructions and care were reviewed and questions were answered. The patient was encouraged to call [facility] for any additional concerns. The patient was referred to [facility] at [facility] on March 17, 2021. Pathology results reported by Stacie Acres RN on 03/10/2021. Electronically Signed   By: Ammie Ferrier M.D.   On: 03/10/2021 13:18   Result Date: 03/10/2021 CLINICAL DATA:  61 year old female presenting for ultrasound-guided biopsy of a right breast mass and right axillary lymph node. EXAM: ULTRASOUND GUIDED RIGHT BREAST CORE NEEDLE BIOPSY COMPARISON:  Previous exam(s). PROCEDURE: I met with the patient and we discussed the procedure of ultrasound-guided biopsy, including benefits and alternatives. We discussed the high likelihood of a successful procedure. We discussed the risks of the procedure, including infection, bleeding, tissue injury, clip migration, and inadequate sampling. Informed written consent was given. The usual time-out protocol was performed immediately prior to the procedure. Lesion quadrant: Upper outer quadrant Using sterile  technique and 1% Lidocaine as local anesthetic, under direct ultrasound visualization, a 14 gauge spring-loaded device was used to perform biopsy of a mass in the right breast at 9 o'clock, 5 cm from the nipple using a lateral approach. At the conclusion of the procedure a ribbon shaped tissue marker clip was deployed into the biopsy cavity. Lesion quadrant: Right axilla Using sterile technique and 1% Lidocaine as local anesthetic, under direct ultrasound visualization, a 14 gauge spring-loaded device was used to perform biopsy of a right axillary lymph node using an inferior approach. At the conclusion of the procedure a tribell tissue marker clip was deployed into the biopsy cavity. Follow up 2 view mammogram was performed and dictated separately. IMPRESSION: 1. Ultrasound guided biopsy of right breast mass at 9 o'clock. No apparent complications. 2. Ultrasound-guided biopsy  of a right axillary lymph node. No apparent complications. Electronically Signed: By: Ammie Ferrier M.D. On: 03/09/2021 15:33      IMPRESSION/PLAN: 1. Stage IB, cT2N0M0, grade 2, triple positive invasive ductal carcinoma of the right breast. Dr. Lisbeth Renshaw discusses the pathology findings and reviews the nature of early stage breast disease. The consensus from the breast conference includes neoadjuvant chemotherapy followed by breast conservation with lumpectomy with sentinel node biopsy. Dr. Lisbeth Renshaw discusses the rationale for adjuvant external radiotherapy to the breast  to reduce risks of local recurrence followed by antiestrogen therapy. We discussed the risks, benefits, short, and long term effects of radiotherapy, as well as the curative intent, and the patient is interested in proceeding. Dr. Lisbeth Renshaw discusses the delivery and logistics of radiotherapy and anticipates a course of 4 or up to 6 1/2 weeks of radiotherapy to the right breast.  We will see her back a few weeks after surgery to discuss the simulation process and anticipate we starting radiotherapy about 4-6 weeks after surgery.  2. Possible genetic predisposition to malignancy. The patient is a candidate for genetic testing given her personal history. She was offered referral and will meet with genetics today.  In a visit lasting 45 minutes, greater than 50% of the time was spent face to face reviewing her case, as well as in preparation of, discussing, and coordinating the patient's care.  The above documentation reflects my direct findings during this shared patient visit. Please see the separate note by Dr. Lisbeth Renshaw on this date for the remainder of the patient's plan of care.    Carola Rhine, [facility]    **Disclaimer: This note was dictated with voice recognition software. Similar sounding words can inadvertently be transcribed and this note may contain transcription errors which may not have been corrected upon publication of  note.**

## 2021-03-16 ENCOUNTER — Other Ambulatory Visit: Payer: Self-pay | Admitting: *Deleted

## 2021-03-16 ENCOUNTER — Encounter: Payer: Self-pay | Admitting: *Deleted

## 2021-03-16 DIAGNOSIS — C50411 Malignant neoplasm of upper-outer quadrant of right female breast: Secondary | ICD-10-CM | POA: Insufficient documentation

## 2021-03-16 DIAGNOSIS — Z170 Estrogen receptor positive status [ER+]: Secondary | ICD-10-CM | POA: Insufficient documentation

## 2021-03-16 NOTE — Progress Notes (Deleted)
[place] NOTE  Patient Care Team: Patient, No Pcp Per (Inactive) as PCP - General ([place]) Tobb, Godfrey Pick, DO as PCP - Cardiology (Cardiology) Stark Klein, MD as Consulting Physician (General Surgery) Benay Pike, MD as Consulting Physician (Hematology and Oncology) Kyung Rudd, MD as Consulting Physician (Radiation Oncology) Rockwell Germany, RN as Oncology Nurse Navigator Mauro Kaufmann, RN as Oncology Nurse Navigator  CHIEF COMPLAINTS/PURPOSE OF CONSULTATION:  Newly diagnosed breast cancer  HISTORY OF PRESENTING ILLNESS:  [name] [name] 61 y.o. female is here because of recent diagnosis of right   Screening mammogram: There is an irregular mass with associated architectural distortion and punctate calcifications in the lateral right breast, corresponding to the palpable abnormality.  Single abnormal right axillary lymph node with a mildly thickened cortex measuring 4 mm. Biopsy revealed grade 2/3 IDC, prognostics?     I reviewed her records extensively and collaborated the history with the patient.  SUMMARY OF ONCOLOGIC HISTORY: Oncology History   No history exists.     MEDICAL HISTORY:  Past Medical History:  Diagnosis Date   Abnormal bleeding in menstrual cycle    Abnormal Pap smear of vagina    Endometrial polyp    Hot flashes    Irregular heart beats    Typical atrial flutter ([place]) 01/01/2019    SURGICAL HISTORY: Past Surgical History:  Procedure Laterality Date   CHOLECYSTECTOMY     SHOULDER SURGERY     Rotator cuff repair    SOCIAL HISTORY: Social History   Socioeconomic History   Marital status: Married    Spouse name: Not on file   Number of children: Not on file   Years of education: Not on file   Highest education level: Not on file  Occupational History   Not on file  Tobacco Use   Smoking status: Never   Smokeless tobacco: Never  Substance and Sexual Activity   Alcohol use: Yes    Alcohol/week:  14.0 standard drinks    Types: 14 Cans of beer per week   Drug use: Not Currently   Sexual activity: Not on file  Other Topics Concern   Not on file  Social History Narrative   Not on file   Social Determinants of Health   Financial Resource Strain: Not on file  Food Insecurity: Not on file  Transportation Needs: Not on file  Physical Activity: Not on file  Stress: Not on file  Social Connections: Not on file  Intimate Partner Violence: Not on file    FAMILY HISTORY: Family History  Problem Relation Age of Onset   Heart attack Mother     ALLERGIES:  is allergic to bee venom.  MEDICATIONS:  Current Outpatient Medications  Medication Sig Dispense Refill   aspirin EC 81 MG tablet Take 81 mg by mouth daily.      Cholecalciferol (VITAMIN D3) 125 MCG (5000 UT) CAPS Take 1,000 Units by mouth daily.     metoprolol succinate (TOPROL XL) 25 MG 24 hr tablet Take 0.5 tablets (12.5 mg total) by mouth daily. 45 tablet 1   Multiple Vitamins-Minerals (MULTIVITAMIN WITH MINERALS) tablet Take 1 tablet by mouth daily.     Multiple Vitamins-Minerals (ZINC PO) Take 1 tablet by mouth daily.      Probiotic Product (PROBIOTIC DAILY PO) Take 1 tablet by mouth daily.      No current facility-administered medications for this visit.    REVIEW OF SYSTEMS:   Constitutional: Denies fevers, chills or abnormal night  sweats Eyes: Denies blurriness of vision, double vision or watery eyes Ears, nose, mouth, throat, and face: Denies mucositis or sore throat Respiratory: Denies cough, dyspnea or wheezes Cardiovascular: Denies palpitation, chest discomfort or lower extremity swelling Gastrointestinal:  Denies nausea, heartburn or change in bowel habits Skin: Denies abnormal skin rashes Lymphatics: Denies new lymphadenopathy or easy bruising Neurological:Denies numbness, tingling or new weaknesses Behavioral/Psych: Mood is stable, no new changes  Breast: *** Denies any palpable lumps or discharge All  other systems were reviewed with the patient and are negative.  PHYSICAL EXAMINATION: ECOG PERFORMANCE STATUS: {CHL ONC ECOG [phone]}  There were no vitals filed for this visit. There were no vitals filed for this visit.  GENERAL:alert, no distress and comfortable SKIN: skin color, texture, turgor are normal, no rashes or significant lesions EYES: normal, conjunctiva are pink and non-injected, sclera clear OROPHARYNX:no exudate, no erythema and lips, buccal mucosa, and tongue normal  NECK: supple, thyroid normal size, non-tender, without nodularity LYMPH:  no palpable lymphadenopathy in the cervical, axillary or inguinal LUNGS: clear to auscultation and percussion with normal breathing effort HEART: regular rate & rhythm and no murmurs and no lower extremity edema ABDOMEN:abdomen soft, non-tender and normal bowel sounds Musculoskeletal:no cyanosis of digits and no clubbing  PSYCH: alert & oriented x 3 with fluent speech NEURO: no focal motor/sensory deficits BREAST:*** No palpable nodules in breast. No palpable axillary or supraclavicular lymphadenopathy (exam performed in the presence of a chaperone)   LABORATORY DATA:  I have reviewed the data as listed Lab Results  Component Value Date   WBC 5.5 12/03/2018   HGB 12.9 12/03/2018   HCT 38.5 12/03/2018   MCV 92 12/03/2018   PLT 255 12/03/2018   Lab Results  Component Value Date   NA 143 12/03/2018   K 4.0 12/03/2018   CL 106 12/03/2018   CO2 24 12/03/2018    RADIOGRAPHIC STUDIES: I have personally reviewed the radiological reports and agreed with the findings in the report.  ASSESSMENT AND PLAN:  No problem-specific Assessment & Plan notes found for this encounter.   All questions were answered. The patient knows to call the clinic with any problems, questions or concerns.    Benay Pike, MD 03/16/21

## 2021-03-17 ENCOUNTER — Ambulatory Visit
Admission: RE | Admit: 2021-03-17 | Discharge: 2021-03-17 | Disposition: A | Discharge status: Home / Self Care | Payer: No Typology Code available for payment source | Source: Ambulatory Visit | Attending: Radiation Oncology | Admitting: Radiation Oncology

## 2021-03-17 ENCOUNTER — Other Ambulatory Visit: Payer: Self-pay

## 2021-03-17 ENCOUNTER — Inpatient Hospital Stay (HOSPITAL_BASED_OUTPATIENT_CLINIC_OR_DEPARTMENT_OTHER): Payer: No Typology Code available for payment source | Admitting: Genetic Counselor

## 2021-03-17 ENCOUNTER — Other Ambulatory Visit: Payer: Self-pay | Admitting: General Surgery

## 2021-03-17 ENCOUNTER — Inpatient Hospital Stay (HOSPITAL_BASED_OUTPATIENT_CLINIC_OR_DEPARTMENT_OTHER): Payer: No Typology Code available for payment source | Admitting: Hematology and Oncology

## 2021-03-17 ENCOUNTER — Inpatient Hospital Stay: Payer: No Typology Code available for payment source | Admitting: Licensed Clinical Social Worker

## 2021-03-17 ENCOUNTER — Encounter: Payer: Self-pay | Admitting: Physical Therapy

## 2021-03-17 ENCOUNTER — Ambulatory Visit: Payer: No Typology Code available for payment source | Attending: General Surgery | Admitting: Physical Therapy

## 2021-03-17 ENCOUNTER — Encounter: Payer: Self-pay | Admitting: *Deleted

## 2021-03-17 ENCOUNTER — Inpatient Hospital Stay: Payer: No Typology Code available for payment source | Attending: Hematology and Oncology

## 2021-03-17 ENCOUNTER — Encounter: Payer: Self-pay | Admitting: Hematology and Oncology

## 2021-03-17 VITALS — BP 172/92 | HR 71 | Temp 97.3°F | Resp 18 | Ht 62.0 in | Wt 150.0 lb

## 2021-03-17 DIAGNOSIS — Z170 Estrogen receptor positive status [ER+]: Secondary | ICD-10-CM

## 2021-03-17 DIAGNOSIS — C50411 Malignant neoplasm of upper-outer quadrant of right female breast: Secondary | ICD-10-CM | POA: Insufficient documentation

## 2021-03-17 DIAGNOSIS — R293 Abnormal posture: Secondary | ICD-10-CM

## 2021-03-17 LAB — CBC WITH DIFFERENTIAL (CANCER CENTER ONLY)
Abs Immature Granulocytes: 0.02 10*3/uL (ref 0.00–0.07)
Basophils Absolute: 0 10*3/uL (ref 0.0–0.1)
Basophils Relative: 0 %
Eosinophils Absolute: 0.2 10*3/uL (ref 0.0–0.5)
Eosinophils Relative: 3 %
HCT: 36.8 % (ref 36.0–46.0)
Hemoglobin: 12.4 g/dL (ref 12.0–15.0)
Immature Granulocytes: 0 %
Lymphocytes Relative: 28 %
Lymphs Abs: 1.5 10*3/uL (ref 0.7–4.0)
MCH: 30.6 pg (ref 26.0–34.0)
MCHC: 33.7 g/dL (ref 30.0–36.0)
MCV: 90.9 fL (ref 80.0–100.0)
Monocytes Absolute: 0.6 10*3/uL (ref 0.1–1.0)
Monocytes Relative: 10 %
Neutro Abs: 3.2 10*3/uL (ref 1.7–7.7)
Neutrophils Relative %: 59 %
Platelet Count: 253 10*3/uL (ref 150–400)
RBC: 4.05 MIL/uL (ref 3.87–5.11)
RDW: 12.9 % (ref 11.5–15.5)
WBC Count: 5.5 10*3/uL (ref 4.0–10.5)
nRBC: 0 % (ref 0.0–0.2)

## 2021-03-17 LAB — CMP (CANCER CENTER ONLY)
ALT: 16 U/L (ref 0–44)
AST: 17 U/L (ref 15–41)
Albumin: 3.9 g/dL (ref 3.5–5.0)
Alkaline Phosphatase: 51 U/L (ref 38–126)
Anion gap: 6 (ref 5–15)
BUN: 23 mg/dL (ref 8–23)
CO2: 27 mmol/L (ref 22–32)
Calcium: 8.9 mg/dL (ref 8.9–10.3)
Chloride: 105 mmol/L (ref 98–111)
Creatinine: 0.73 mg/dL (ref 0.44–1.00)
GFR, Estimated: >60 mL/min (ref >60)
Glucose, Bld: 117 mg/dL — ABNORMAL HIGH (ref 70–99)
Potassium: 3.8 mmol/L (ref 3.5–5.1)
Sodium: 138 mmol/L (ref 135–145)
Total Bilirubin: 0.4 mg/dL (ref 0.3–1.2)
Total Protein: 6.4 g/dL — ABNORMAL LOW (ref 6.5–8.1)

## 2021-03-17 LAB — GENETIC SCREENING ORDER

## 2021-03-17 MED ORDER — LIDOCAINE-PRILOCAINE 2.5-2.5 % EX CREA: TOPICAL_CREAM | CUTANEOUS | 3 refills | Status: DC

## 2021-03-17 MED ORDER — DEXAMETHASONE 4 MG PO TABS: 8.0000 mg | ORAL_TABLET | Freq: Two times a day (BID) | ORAL | 1 refills | Status: DC

## 2021-03-17 MED ORDER — PROCHLORPERAZINE MALEATE 10 MG PO TABS: 10.0000 mg | ORAL_TABLET | Freq: Four times a day (QID) | ORAL | 1 refills | Status: DC | PRN

## 2021-03-17 MED ORDER — ONDANSETRON HCL 8 MG PO TABS: 8.0000 mg | ORAL_TABLET | Freq: Two times a day (BID) | ORAL | 1 refills | Status: DC | PRN

## 2021-03-17 NOTE — Therapy (Signed)
OUTPATIENT PHYSICAL THERAPY BREAST CANCER BASELINE EVALUATION   Patient Name: [name] [name] MRN: [id number] DOB:1960-03-17, 61 y.o., female Today's Date: 03/17/2021   PT End of Session - 03/17/21 1108     Visit Number 1    Number of Visits 2    Date for PT Re-Evaluation 09/14/21    PT Start Time 1003    PT Stop Time 1011   Also saw pt from 1042-1056 for a total of 22 min   PT Time Calculation (min) 8 min    Activity Tolerance Patient tolerated treatment well    Behavior During Therapy [facility] for tasks assessed/performed             Past Medical History:  Diagnosis Date   Abnormal bleeding in menstrual cycle    Abnormal Pap smear of vagina    Breast cancer (HCC)    Endometrial polyp    Hot flashes    Irregular heart beats    Typical atrial flutter ([place]) 01/01/2019   Past Surgical History:  Procedure Laterality Date   CHOLECYSTECTOMY     SHOULDER SURGERY     Rotator cuff repair   Patient Active Problem List   Diagnosis Date Noted   Malignant neoplasm of upper-outer quadrant of right breast in female, estrogen receptor positive ([place]) 03/16/2021   Typical atrial flutter ([place]) 01/01/2019    PCP: Patient, No Pcp Per (Inactive)  REFERRING PROVIDER: Stark Klein, MD  REFERRING DIAG: Right breast cancer  THERAPY DIAG:  Malignant neoplasm of upper-outer quadrant of right breast in female, estrogen receptor positive ([place])  Abnormal posture  ONSET DATE: 03/04/2021  SUBJECTIVE                                                                                                                                                                                           SUBJECTIVE STATEMENT: Patient reports she is here today to be seen by her medical team for her newly diagnosed right breast cancer.   PERTINENT HISTORY:  Patient was diagnosed on 03/04/2021 with right grade II invasive ductal carcinoma breast cancer. It measures 2.2 cm and is located in the upper outer quadrant. It  is triple positive with a Ki67 of 25%.  She has a history of breast implants and had a right rotator cuff repair in 2019.  PATIENT GOALS   reduce lymphedema risk and learn post op HEP.   PAIN:  Are you having pain? No  PRECAUTIONS: Active CA   HAND DOMINANCE: right  WEIGHT BEARING RESTRICTIONS No  FALLS:  Has patient fallen in last 6 months? No, Number of falls: 1 - pt fell at work  and reports this was an accident and not a balance issue.  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  LEISURE: She does not exercise but is very active mowing yards  PRIOR LEVEL OF FUNCTION: Independent   OBJECTIVE  COGNITION:  Overall cognitive status: Within functional limits for tasks assessed    POSTURE:  Forward head and rounded shoulders posture  UPPER EXTREMITY AROM/PROM:  A/PROM RIGHT  03/17/2021   Shoulder extension 58  Shoulder flexion 144  Shoulder abduction 165  Shoulder internal rotation 82  Shoulder external rotation 78    (Blank rows = not tested)  A/PROM LEFT  03/17/2021  Shoulder extension 53  Shoulder flexion 144  Shoulder abduction 161  Shoulder internal rotation 77  Shoulder external rotation 90    (Blank rows = not tested)   CERVICAL AROM: All within normal limits   UPPER EXTREMITY STRENGTH: WNL   LYMPHEDEMA ASSESSMENTS:   LANDMARK RIGHT  03/17/2021  10 cm proximal to olecranon process 28.9  Olecranon process 24.7  10 cm proximal to ulnar styloid process 23.1  Just proximal to ulnar styloid process 15.6  Across hand at thumb web space 18.2  At base of 2nd digit 6  (Blank rows = not tested)  LANDMARK LEFT  03/17/2021  10 cm proximal to olecranon process 29.2  Olecranon process 24.8  10 cm proximal to ulnar styloid process 22.3  Just proximal to ulnar styloid process 15.7   Across hand at thumb web space 18.2  At base of 2nd digit 6  (Blank rows = not tested)   L-DEX LYMPHEDEMA SCREENING:  The patient was assessed using the L-Dex machine today to produce a lymphedema index baseline score. The patient will be reassessed on a regular basis (typically every 3 months) to obtain new L-Dex scores. If the score is > 6.5 points away from his/her baseline score indicating onset of subclinical lymphedema, it will be recommended to wear a compression garment for 4 weeks, 12 hours per day and then be reassessed. If the score continues to be > 6.5 points from baseline at reassessment, we will initiate lymphedema treatment. Assessing in this manner has a 95% rate of preventing clinically significant lymphedema.   L-DEX FLOWSHEETS - 03/17/21 1100       L-DEX LYMPHEDEMA SCREENING   Measurement Type Unilateral    L-DEX MEASUREMENT EXTREMITY Upper Extremity    POSITION  Standing    DOMINANT SIDE Right    At Risk Side Right    BASELINE SCORE (UNILATERAL) -0.9              QUICK DASH SURVEY:  Katina Dung - 03/17/21 0001     Open a tight or new jar No difficulty    Do heavy household chores (wash walls, wash floors) No difficulty    Carry a shopping bag or briefcase No difficulty    Wash your back No difficulty    Use a knife to cut food No difficulty    Recreational activities in which you take some force or impact through your arm, shoulder, or hand (golf, hammering, tennis) No difficulty    During the past week, to what extent has your arm, shoulder or hand problem interfered with your normal social activities with family, friends, neighbors, or groups? Not at all    During the past week,  to what extent has your arm, shoulder or hand problem limited your work or other regular daily activities Not at all    Arm, shoulder, or hand pain. None    Tingling (pins and needles) in your arm, shoulder, or hand None    Difficulty Sleeping No difficulty    DASH Score 0 %               PATIENT EDUCATION:  Education details: Lymphedema risk reduction and post op shoulder/posture HEP Person educated: Patient Education method: Explanation, Demonstration, Handout Education comprehension: Patient verbalized understanding and returned demonstration   HOME EXERCISE PROGRAM: Patient was instructed today in a home exercise program today for post op shoulder range of motion. These included active assist shoulder flexion in sitting, scapular retraction, wall walking with shoulder abduction, and hands behind head external rotation.  She was encouraged to do these twice a day, holding 3 seconds and repeating 5 times when permitted by her physician.   ASSESSMENT:  CLINICAL IMPRESSION: Patient was diagnosed on 03/04/2021 with right grade II invasive ductal carcinoma breast cancer. It measures 2.2 cm and is located in the upper outer quadrant. It is triple positive with a Ki67 of 25%.  She has a history of breast implants and had a right rotator cuff repair in 2019.Her multidisciplinary medical team met prior to her assessments to determine a recommended treatment plan. She is planning to have neoadjuvant chemotherapy followed by a right lumpectomy and sentinel node biopsy, radiation, and anti-estrogen therapy. She will benefit from a post op PT reassessment to determine needs and from L-Dex screens every 3 months for 2 years to detect subclinical lymphedema.  Pt will benefit from skilled therapeutic intervention to improve on the following deficits: Decreased knowledge of precautions, impaired UE functional use, pain, decreased ROM, postural dysfunction.   PT treatment/interventions: ADL/self-care home management, pt/family education, therapeutic exercise  REHAB POTENTIAL: Excellent  CLINICAL DECISION MAKING: Stable/uncomplicated  EVALUATION COMPLEXITY: Low   GOALS: Goals reviewed with patient? YES  LONG TERM GOALS: (STG=LTG)   Name Target Date Goal status  1  Pt will be able to verbalize understanding of pertinent lymphedema risk reduction practices relevant to her dx specifically related to skin care.  Baseline:  No knowledge 03/17/2021 Achieved at eval  2 Pt will be able to return demo and/or verbalize understanding of the post op HEP related to regaining shoulder ROM. Baseline:  No knowledge 03/17/2021 Achieved at eval  3 Pt will be able to verbalize understanding of the importance of attending the post op After Breast CA Class for further lymphedema risk reduction education and therapeutic exercise.  Baseline:  No knowledge 03/17/2021 Achieved at eval  4 Pt will demo she has regained full shoulder ROM and function post operatively compared to baselines.  Baseline: See objective measurements taken today. 09/14/2021      PLAN: PT FREQUENCY/DURATION: EVAL and 1 follow up appointment.   PLAN FOR NEXT SESSION: will reassess 3-4 weeks post op to determine needs.   Patient will follow up at outpatient cancer rehab 3-4 weeks following surgery.  If the patient requires physical therapy at that time, a specific plan will be dictated and sent to the referring physician for approval. The patient was educated today on appropriate basic range of motion exercises to begin post operatively and the importance of attending the After Breast Cancer class following surgery.  Patient was educated today on lymphedema risk reduction practices as it pertains to recommendations that will benefit the patient immediately  following surgery.  She verbalized good understanding.    Physical Therapy Information for After Breast Cancer Surgery/Treatment:  Lymphedema is a swelling condition that you may be at risk for in your arm if you have lymph nodes removed from the armpit area.  After a sentinel node biopsy, the risk is approximately 5-9% and is higher after an axillary node dissection.  There is treatment available for this condition and it is not life-threatening.  Contact your  physician or physical therapist with concerns. You may begin the 4 shoulder/posture exercises (see additional sheet) when permitted by your physician (typically a week after surgery).  If you have drains, you may need to wait until those are removed before beginning range of motion exercises.  A general recommendation is to not lift your arms above shoulder height until drains are removed.  These exercises should be done to your tolerance and gently.  This is not a "no pain/no gain" type of recovery so listen to your body and stretch into the range of motion that you can tolerate, stopping if you have pain.  If you are having immediate reconstruction, ask your plastic surgeon about doing exercises as he or she may want you to wait. We encourage you to attend the free one time ABC (After Breast Cancer) class offered by [facility].  You will learn information related to lymphedema risk, prevention and treatment and additional exercises to regain mobility following surgery.  You can call [phone] for more information.  This is offered the 1st and 3rd Monday of each month.  You only attend the class one time. While undergoing any medical procedure or treatment, try to avoid blood pressure being taken or needle sticks from occurring on the arm on the side of cancer.   This recommendation begins after surgery and continues for the rest of your life.  This may help reduce your risk of getting lymphedema (swelling in your arm). An excellent resource for those seeking information on lymphedema is the National Lymphedema Network's web site. It can be accessed at [place].org If you notice swelling in your hand, arm or breast at any time following surgery (even if it is many years from now), please contact your doctor or physical therapist to discuss this.  Lymphedema can be treated at any time but it is easier for you if it is treated early on.  If you feel like your shoulder motion is  not returning to normal in a reasonable amount of time, please contact your surgeon or physical therapist.  [facility] [phone]. [address], [place] [address]  ABC CLASS After Breast Cancer Class  After Breast Cancer Class is a specially designed exercise class to assist you in a safe recover after having breast cancer surgery.  In this class you will learn how to get back to full function whether your drains were just removed or if you had surgery a month ago.  This one-time class is held the 1st and 3rd Monday of every month from 11:00 a.m. until 12:00 noon virtually.  This class is FREE and space is limited. For more information or to register for the next available class, call [phone].  Class Goals  Understand specific stretches to improve the flexibility of you chest and shoulder. Learn ways to safely strengthen your upper body and improve your posture. Understand the warning signs of infection and why you may be at risk for an arm infection. Learn  about Lymphedema and prevention.  ** You do not attend this class until after surgery.  Drains must be removed to participate  Patient was instructed today in a home exercise program today for post op shoulder range of motion. These included active assist shoulder flexion in sitting, scapular retraction, wall walking with shoulder abduction, and hands behind head external rotation.  She was encouraged to do these twice a day, holding 3 seconds and repeating 5 times when permitted by her physician.    Niana Martorana,MARTI COOPER, PT 03/17/2021, 11:09 AM

## 2021-03-17 NOTE — Progress Notes (Signed)
REFERRING PROVIDER: Benay Pike, MD [place], [place] [id number]  PRIMARY PROVIDER:  Patient, No Pcp Per (Inactive)  PRIMARY REASON FOR VISIT:  1. Malignant neoplasm of upper-outer quadrant of right breast in female, estrogen receptor positive ([place])     HISTORY OF PRESENT ILLNESS:   [name] [name], a 61 y.o. female, was seen for a [place] cancer genetics consultation during the breast multidisciplinary clinic at the request of Dr. Chryl Heck due to a personal history of cancer.  [name] [name] presents to clinic today to discuss the possibility of a hereditary predisposition to cancer, to discuss genetic testing, and to further clarify her future cancer risks, as well as potential cancer risks for family members.   In February 2023, at the age of 69, [name] [name] was diagnosed with invasive ductal carcinoma of the right breast.   CANCER HISTORY:  Oncology History  Malignant neoplasm of upper-outer quadrant of right breast in female, estrogen receptor positive ([place])  03/16/2021 Initial Diagnosis   Malignant neoplasm of upper-outer quadrant of right breast in female, estrogen receptor positive ([place])   04/05/2021 -  Chemotherapy   Patient is on Treatment Plan : BREAST  Docetaxel + Carboplatin + Trastuzumab + Pertuzumab  (TCHP) q21d         RISK FACTORS:  Menarche was at age 17.  First live birth at age 20.  OCP use for approximately 8 years.  Ovaries intact: yes.  Uterus intact: yes.  Menopausal status: postmenopausal.  HRT use: 5 years. Colonoscopy: yes Mammogram within the last year: yes. Any excessive radiation exposure in the past: no  Past Medical History:  Diagnosis Date   Abnormal bleeding in menstrual cycle    Abnormal Pap smear of vagina    Breast cancer (HCC)    Endometrial polyp    Hot flashes    Irregular heart beats    Typical atrial flutter ([place]) 01/01/2019    Past Surgical History:  Procedure Laterality Date   CHOLECYSTECTOMY     SHOULDER  SURGERY     Rotator cuff repair    Social History   Socioeconomic History   Marital status: Married    Spouse name: Not on file   Number of children: Not on file   Years of education: Not on file   Highest education level: Not on file  Occupational History   Not on file  Tobacco Use   Smoking status: Never   Smokeless tobacco: Never  Substance and Sexual Activity   Alcohol use: Yes    Alcohol/week: 14.0 standard drinks    Types: 14 Cans of beer per week   Drug use: Not Currently   Sexual activity: Not on file  Other Topics Concern   Not on file  Social History Narrative   Not on file   Social Determinants of Health   Financial Resource Strain: Not on file  Food Insecurity: Not on file  Transportation Needs: Not on file  Physical Activity: Not on file  Stress: Not on file  Social Connections: Not on file     FAMILY HISTORY:  We obtained a detailed, 4-generation family history.  Significant diagnoses are listed below: Family History  Problem Relation Age of Onset   Heart attack Mother    Ms. Gammon does not have a family history of cancer and she is unaware of previous family history of genetic testing for hereditary cancer risks. There is no reported Ashkenazi Jewish ancestry.    GENETIC COUNSELING ASSESSMENT: [name] [name] is  a 61 y.o. female with a personal history of cancer which is somewhat suggestive of a hereditary cancer syndrome. We, therefore, discussed and recommended the following at today's visit.   DISCUSSION: We discussed that 5 - 10% of cancer is hereditary, with most cases of hereditary breast cancer associated with mutations in BRCA1/2.  There are other genes that can be associated with hereditary breast cancer syndromes. Type of cancer risk and level of risk are gene-specific. We discussed that testing is beneficial for several reasons including knowing how to follow individuals after completing their treatment, identifying whether potential treatment  options would be beneficial, and understanding if other family members could be at risk for cancer and allowing them to undergo genetic testing.   We reviewed the characteristics, features and inheritance patterns of hereditary cancer syndromes. We also discussed genetic testing, including the appropriate family members to test, the process of testing, insurance coverage and turn-around-time for results. We discussed the implications of a negative, positive and/or variant of uncertain significant result. In order to get genetic test results in a timely manner so that [name] [name] can use these genetic test results for surgical decisions, we recommended [name] [name] pursue genetic testing for the [facility] panel. Once complete, we recommend [name] [name] pursue reflex genetic testing to a more comprehensive gene panel.   [name] [name] elected to have [facility]'s CustomNext Panel+RNA. The CustomNext-Cancer+RNAinsight panel offered by [facility] includes sequencing and rearrangement analysis for the following 47 genes:  APC, ATM, AXIN2, BARD1, BMPR1A, BRCA1, BRCA2, BRIP1, CDH1, CDK4, CDKN2A, CHEK2, CTNNA1, DICER1, EPCAM, GREM1, HOXB13, KIT, MEN1, MLH1, MSH2, MSH3, MSH6, MUTYH, NBN, NF1, NTHL1, PALB2, PDGFRA, PMS2, POLD1, POLE, PTEN, RAD50, RAD51C, RAD51D, SDHA, SDHB, SDHC, SDHD, SMAD4, SMARCA4, STK11, TP53, TSC1, TSC2, and VHL.  RNA data is routinely analyzed for use in variant interpretation for all genes.  Based on Ms. Salamon's personal history of cancer, she does not quite meet medical criteria for genetic testing. She may have an OOP cost. We discussed that if her out of pocket cost for testing is over $100, the laboratory should contact them to discuss self-pay prices, patient pay assistance programs, if applicable, and other billing options.   PLAN: After considering the risks, benefits, and limitations, [name] [name] provided informed consent to pursue genetic testing and the blood sample was sent to [facility] for  analysis of the CustomNext Panel+RNA. Results should be available within approximately 1-2 weeks' time, at which point they will be disclosed by telephone to [name] [name], as will any additional recommendations warranted by these results. [name] [name] will receive a summary of her genetic counseling visit and a copy of her results once available. This information will also be available in Epic.   [name] [name] questions were answered to her satisfaction today. Our contact information was provided should additional questions or concerns arise. Thank you for the referral and allowing [place] to share in the care of your patient.   Lucille Passy, MS, [facility] Genetic Counselor [email or web address] (P) [phone]  The patient was seen for a total of 20 minutes in face-to-face genetic counseling.  The patient brought her daughter. Drs. Lindi Adie and/or Burr Medico were available to discuss this case as needed.  _______________________________________________________________________ For Office Staff:  Number of people involved in session: 2 Was an Intern/ student involved with case: no

## 2021-03-17 NOTE — Progress Notes (Signed)
[facility] NOTE  Patient Care Team: Patient, No Pcp Per (Inactive) as PCP - General (General Practice) Tobb, Godfrey Pick, DO as PCP - Cardiology (Cardiology) Stark Klein, MD as Consulting Physician (General Surgery) Benay Pike, MD as Consulting Physician (Hematology and Oncology) Kyung Rudd, MD as Consulting Physician (Radiation Oncology) Rockwell Germany, RN as Oncology Nurse Navigator Mauro Kaufmann, RN as Oncology Nurse Navigator  CHIEF COMPLAINTS/PURPOSE OF CONSULTATION:  Newly diagnosed breast cancer  HISTORY OF PRESENTING ILLNESS:  [name] [name] 61 y.o. female is here because of recent diagnosis of right sided IDC.  Screening mammogram: There is an irregular mass with associated architectural distortion and punctate calcifications in the lateral right breast, corresponding to the palpable abnormality.  Single abnormal right axillary lymph node with a mildly thickened cortex measuring 4 mm. Ultrasound of the breast showed 2.0 cm highly suspicious mass in the right breast at 9 o'clock position, single abnormal right axillary lymph node with a mildly thickened cortex measuring 4 mm. 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 She was presented in the breast [place] for recommendations.  Patient arrived today with her daughter who is a Librarian, academic in orthopedics department.  [name] is quite healthy at baseline, denies any major medical comorbidities except for very severe COVID infection and history of heart palpitations which were evaluated with no major abnormalities. She mentions history of breast implants which were removed in 2021, she had them for 19 years, had issues with encapsulation and revisions from time to time.  She also remembers an episode of very bad mastitis in the right breast while she was breast-feeding.  She has been  taking estrogen and progesterone for the past 5 years because of severe hot flashes, this was stopped about 2 ago.  She also took birth control for about 8 years in the past from [id number]. 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 No family history of malignancies.  She does briefly remember that she has been working with insecticide/p[employer]ide [employer] for very long time.  She complains of pain since the biopsy in the right breast in the right axilla.  Rest of the pertinent 10 point ROS reviewed and negative. I reviewed her records extensively and collaborated the history with the patient.  SUMMARY OF ONCOLOGIC HISTORY: Oncology History  Malignant neoplasm of upper-outer quadrant of right breast in female, estrogen receptor positive ([place])  03/16/2021 Initial Diagnosis   Malignant neoplasm of upper-outer quadrant of right breast in female, estrogen receptor positive ([place])   04/05/2021 -  Chemotherapy   Patient is on Treatment Plan : BREAST  Docetaxel + Carboplatin + Trastuzumab + Pertuzumab  (TCHP) q21d         MEDICAL HISTORY:  Past Medical History:  Diagnosis Date   Abnormal bleeding in menstrual cycle    Abnormal Pap smear of vagina    Breast cancer (HCC)    Endometrial polyp    Hot flashes    Irregular heart beats    Typical atrial flutter ([place]) 01/01/2019    SURGICAL HISTORY: Past Surgical History:  Procedure Laterality Date   CHOLECYSTECTOMY     SHOULDER SURGERY     Rotator cuff repair    SOCIAL HISTORY: Social History  Socioeconomic History   Marital status: Married    Spouse name: Not on file   Number of children: Not on file   Years of education: Not on file   Highest education level: Not on file  Occupational History   Not on file  Tobacco Use   Smoking status: Never   Smokeless tobacco: Never  Substance  and Sexual Activity   Alcohol use: Yes    Alcohol/week: 14.0 standard drinks    Types: 14 Cans of beer per week   Drug use: Not Currently   Sexual activity: Not on file  Other Topics Concern   Not on file  Social History Narrative   Not on file   Social Determinants of Health   Financial Resource Strain: Not on file  Food Insecurity: Not on file  Transportation Needs: Not on file  Physical Activity: Not on file  Stress: Not on file  Social Connections: Not on file  Intimate Partner Violence: Not on file    FAMILY HISTORY: Family History  Problem Relation Age of Onset   Heart attack Mother     ALLERGIES:  is allergic to bee venom.  MEDICATIONS:  Current Outpatient Medications  Medication Sig Dispense Refill   Cholecalciferol (VITAMIN D3) 125 MCG (5000 UT) CAPS Take 1,000 Units by mouth daily.     Probiotic Product (PROBIOTIC DAILY PO) Take 1 tablet by mouth daily.      aspirin EC 81 MG tablet Take 81 mg by mouth daily.      dexamethasone (DECADRON) 4 MG tablet Take 2 tablets (8 mg total) by mouth 2 (two) times daily. Start the day before Taxotere. Then take daily x 3 days after chemotherapy. 30 tablet 1   lidocaine-prilocaine (EMLA) cream Apply to affected area once 30 g 3   metoprolol succinate (TOPROL XL) 25 MG 24 hr tablet Take 0.5 tablets (12.5 mg total) by mouth daily. (Patient not taking: Reported on 03/17/2021) 45 tablet 1   ondansetron (ZOFRAN) 8 MG tablet Take 1 tablet (8 mg total) by mouth 2 (two) times daily as needed (Nausea or vomiting). Start on the third day after chemotherapy. 30 tablet 1   prochlorperazine (COMPAZINE) 10 MG tablet Take 1 tablet (10 mg total) by mouth every 6 (six) hours as needed (Nausea or vomiting). 30 tablet 1   No current facility-administered medications for this visit.    PHYSICAL EXAMINATION: ECOG PERFORMANCE STATUS: 0 - Asymptomatic  Vitals:   03/17/21 0841  BP: (!) 172/92  Pulse: 71  Resp: 18  Temp: (!) 97.3 F (36.3 C)   SpO2: 100%   Filed Weights   03/17/21 0841  Weight: 150 lb (68 kg)    GENERAL:alert, no distress and comfortable SKIN: skin color, texture, turgor are normal, no rashes or significant lesions EYES: normal, conjunctiva are pink and non-injected, sclera clear OROPHARYNX:no exudate, no erythema and lips, buccal mucosa, and tongue normal  NECK: supple, thyroid normal size, non-tender, without nodularity LYMPH:  no palpable lymphadenopathy in the cervical, axillary LUNGS: clear to auscultation and percussion with normal breathing effort HEART: regular rate & rhythm and no murmurs and no lower extremity edema ABDOMEN:abdomen soft, non-tender and normal bowel sounds Musculoskeletal:no cyanosis of digits and no clubbing  PSYCH: alert & oriented x 3 with fluent speech NEURO: no focal motor/sensory deficits BREAST: Palpable right breast mass measuring around 2.5 cm at 9 o'clock position of the right breast, easily mobile within the breast.  Skin over the right breast appears to be pinchable  and not involved.  There is a palpable right axillary lymphadenopathy which was negative on biopsy, so this could be postbiopsy ecchymosis.   LABORATORY DATA:  I have reviewed the data as listed Lab Results  Component Value Date   WBC 5.5 03/17/2021   HGB 12.4 03/17/2021   HCT 36.8 03/17/2021   MCV 90.9 03/17/2021   PLT 253 03/17/2021   Lab Results  Component Value Date   NA 138 03/17/2021   K 3.8 03/17/2021   CL 105 03/17/2021   CO2 27 03/17/2021    RADIOGRAPHIC STUDIES: I have personally reviewed the radiological reports and agreed with the findings in the report.  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  Patient arrived to the appointment today with her daughter who happens to be a PA in the emergency room, orthopedics. She is healthy at baseline with no major medical comorbidities, reports history of hormone supplementation for the past 5 years, quit 2 weeks ago as well as a history of Lyme's which were removed 2021. Physical examination today confirms the right breast mass measuring about 2-1/2 cm in largest dimension on exam today at 9 o'clock position, palpable right axillary lymphadenopathy which is likely postbiopsy changes, lymph node is biopsy negative. Given tumor measuring greater than 2 cm at the time of diagnosis and since she is HER2 amplified, we have discussed about proceeding with neoadjuvant TCHP.  Following neoadjuvant therapy if she has complete pathologic response then she will continue Herceptin and Perjeta for a total of 1 year.  If she does not have complete pathologic response then she will be an excellent candidate for Her2 Compass study.  She will also be a good candidate for the nausea study.  Research team was informed.  We have discussed about schedule for TCHP, adverse effects with TCHP including but not limited to fatigue, nausea, vomiting, diarrhea, increased risk of infections, neuropathy, alopecia, cardiotoxicity with Herceptin.  She understands that some of the treatment side effects can be severe and permanent.  She is willing to proceed with treatment as recommended.  She will need a port and an echocardiogram.  Anticipate initiation of chemotherapy in second week of March because of patient's schedule.   All questions were answered. The patient knows to call the clinic with any problems, questions or concerns.    Benay Pike, MD 03/17/21

## 2021-03-17 NOTE — Progress Notes (Signed)
START ON PATHWAY REGIMEN - Breast     Cycle 1: A cycle is 21 days:     Pertuzumab      Trastuzumab-xxxx      Docetaxel      Carboplatin    Cycles 2 through 6: A cycle is every 21 days:     Pertuzumab      Trastuzumab-xxxx      Docetaxel      Carboplatin   **Always confirm dose/schedule in your pharmacy ordering system**  Patient Characteristics: Preoperative or Nonsurgical Candidate (Clinical Staging), Neoadjuvant Therapy followed by Surgery, Invasive Disease, Chemotherapy, HER2 Positive, ER Positive Therapeutic Status: Preoperative or Nonsurgical Candidate (Clinical Staging) AJCC M Category: cM0 AJCC Grade: G2 Breast Surgical Plan: Neoadjuvant Therapy followed by Surgery ER Status: Positive (+) AJCC 8 Stage Grouping: IB HER2 Status: Positive (+) AJCC T Category: cT2 AJCC N Category: cN0 PR Status: Positive (+) Intent of Therapy: Curative Intent, Discussed with Patient

## 2021-03-17 NOTE — Assessment & Plan Note (Signed)
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  Patient arrived to the appointment today with her daughter who happens to be a PA in the emergency room, orthopedics. She is healthy at baseline with no major medical comorbidities, reports history of hormone supplementation for the past 5 years, quit 2 weeks ago as well as a history of Lyme's which were removed 2021. Physical examination today confirms the right breast mass measuring about 2-1/2 cm in largest dimension on exam today at 9 o'clock position, palpable right axillary lymphadenopathy which is likely postbiopsy changes, lymph node is biopsy negative. Given tumor measuring greater than 2 cm at the time of diagnosis and since she is HER2 amplified, we have discussed about proceeding with neoadjuvant TCHP.  Following neoadjuvant therapy if she has complete pathologic response then she will continue Herceptin and Perjeta for a total of 1 year.  If she does not have complete pathologic response then she will be an excellent candidate for Her2 Compass study.  She will also be a good candidate for the nausea study.  Research team was informed.  We have discussed about schedule for TCHP, adverse effects with TCHP including but not limited to fatigue, nausea, vomiting, diarrhea, increased risk of infections, neuropathy, alopecia, cardiotoxicity with Herceptin.  She understands that some of the treatment side effects can be severe and permanent.  She is willing to proceed with treatment as recommended.  She will need a port and an echocardiogram.  Anticipate initiation of chemotherapy in second week of March because of patient's schedule.

## 2021-03-18 ENCOUNTER — Telehealth: Payer: Self-pay | Admitting: Emergency Medicine

## 2021-03-18 NOTE — Telephone Encounter (Signed)
[id number] - TREATMENT OF REFRACTORY NAUSEA  03/18/21  Patient [name] [name] was identified by Dr. Chryl Heck as a potential candidate for the above listed study.  This Clinical Research Coordinator called the patient [name] [name], MRN [id number], today.  The patient requested for her daughter to also be on the phone today and gave permission to discuss the study with her.  A copy of the informed consent document and separate HIPAA Authorization was emailed to the patient at her request.  Patient reads, speaks, and understands [place].   Patient was provided with the contact information of this Coordinator and encouraged to contact the research team with any questions.    Approximately 15 minutes were spent with the patient reviewing the informed consent documents.  Patient was encouraged to review at their convenience with their support network, including other care providers.   [name] daughter stated the she or the patient would call back with any questions or concerns.  She requested to not be called until her next appointment as she will be out of town.  Clabe Seal Clinical Research Coordinator I  03/18/21 11:59 AM

## 2021-03-18 NOTE — Telephone Encounter (Signed)
[employer] 2021-05 - Specimen Collection Study to Evaluate Biomarkers in Subjects with Cancer   03/18/21  While on the phone with the patient and her daughter, also introduced this study.  Sent the patient a copy of the informed consent document with embedded HIPAA authorization via email at her request.  Clabe Seal Clinical Research Coordinator I  03/18/21 12:00 PM

## 2021-03-20 ENCOUNTER — Other Ambulatory Visit: Payer: No Typology Code available for payment source

## 2021-03-22 ENCOUNTER — Telehealth: Payer: Self-pay | Admitting: Hematology and Oncology

## 2021-03-22 NOTE — Telephone Encounter (Signed)
.  Called patient to schedule appointment per 2/23 inbasket, patient is aware of date and time.

## 2021-03-25 ENCOUNTER — Encounter: Payer: Self-pay | Admitting: *Deleted

## 2021-03-25 ENCOUNTER — Telehealth: Payer: Self-pay | Admitting: *Deleted

## 2021-03-25 NOTE — Telephone Encounter (Signed)
Called pt, left vm regarding BMDC from 2.22.23. Contact information provided for questions or needs.  ?

## 2021-03-26 ENCOUNTER — Other Ambulatory Visit (HOSPITAL_COMMUNITY): Payer: No Typology Code available for payment source

## 2021-03-26 ENCOUNTER — Other Ambulatory Visit: Payer: No Typology Code available for payment source

## 2021-03-26 ENCOUNTER — Encounter: Payer: Self-pay | Admitting: Hematology and Oncology

## 2021-03-26 ENCOUNTER — Telehealth: Payer: Self-pay | Admitting: Genetic Counselor

## 2021-03-26 NOTE — Telephone Encounter (Signed)
I attempted to contact Ms. Longo to discuss her genetic testing results (8 genes). I left a voicemail requesting she call me back at [phone]. ? ?Lucille Passy, MS, LCGC ?Genetic Counselor ?[email or web address] ?(P) [phone] ? ?

## 2021-03-31 ENCOUNTER — Encounter: Payer: Self-pay | Admitting: Genetic Counselor

## 2021-03-31 ENCOUNTER — Telehealth: Payer: Self-pay | Admitting: Genetic Counselor

## 2021-03-31 DIAGNOSIS — Z1379 Encounter for other screening for genetic and chromosomal anomalies: Secondary | ICD-10-CM | POA: Insufficient documentation

## 2021-03-31 NOTE — Progress Notes (Signed)
Pharmacist Chemotherapy Monitoring - Initial Assessment   ? ?Anticipated start date: 04/07/21  ? ?The following has been reviewed per standard work regarding the patient's treatment regimen: ?[name] diagnosis, treatment plan and drug doses, and organ/hematologic function ?Lab orders and baseline tests specific to treatment regimen  ?The treatment plan start date, drug sequencing, and pre-medications ?Prior authorization status  ?Patient's documented medication list, including drug-drug interaction screen and prescriptions for anti-emetics and supportive care specific to the treatment regimen ?The drug concentrations, fluid compatibility, administration routes, and timing of the medications to be used ?[name] access for treatment and lifetime cumulative dose history, if applicable  ?[name] medication allergies and previous infusion related reactions, if applicable  ? ?Changes made to treatment plan:  ?N/A ? ?Follow up needed:  ?Pending authorization for treatment  ?F/u [facility] results ? ? ?Philomena Course, [place], ?03/31/2021  2:38 PM  ?

## 2021-03-31 NOTE — Telephone Encounter (Signed)
I contacted Ms. Dezeeuw daughter, [name], to discuss her genetic testing results. No pathogenic variants were identified in the 47 genes analyzed. Detailed clinic note to follow. ? ?The test report has been scanned into EPIC and is located under the Molecular Pathology section of the Results Review tab.  A portion of the result report is included below for reference.  ? ?Lucille Passy, MS, [place] ?Genetic Counselor ?[email or web address] ?(P) [phone] ? ? ?

## 2021-04-01 ENCOUNTER — Inpatient Hospital Stay: Payer: No Typology Code available for payment source | Attending: Hematology and Oncology

## 2021-04-01 ENCOUNTER — Encounter: Payer: Self-pay | Admitting: *Deleted

## 2021-04-01 ENCOUNTER — Ambulatory Visit (HOSPITAL_COMMUNITY)
Admission: RE | Admit: 2021-04-01 | Discharge: 2021-04-01 | Disposition: A | Discharge status: Home / Self Care | Payer: No Typology Code available for payment source | Source: Ambulatory Visit | Attending: Hematology and Oncology | Admitting: Hematology and Oncology

## 2021-04-01 ENCOUNTER — Other Ambulatory Visit: Payer: Self-pay

## 2021-04-01 ENCOUNTER — Encounter: Payer: Self-pay | Admitting: Emergency Medicine

## 2021-04-01 ENCOUNTER — Ambulatory Visit
Admission: RE | Admit: 2021-04-01 | Discharge: 2021-04-01 | Disposition: A | Discharge status: Home / Self Care | Payer: No Typology Code available for payment source | Source: Ambulatory Visit | Attending: Hematology and Oncology | Admitting: Hematology and Oncology

## 2021-04-01 DIAGNOSIS — Z0189 Encounter for other specified special examinations: Secondary | ICD-10-CM | POA: Diagnosis not present

## 2021-04-01 DIAGNOSIS — Z170 Estrogen receptor positive status [ER+]: Secondary | ICD-10-CM | POA: Diagnosis not present

## 2021-04-01 DIAGNOSIS — C50411 Malignant neoplasm of upper-outer quadrant of right female breast: Secondary | ICD-10-CM | POA: Diagnosis not present

## 2021-04-01 DIAGNOSIS — I4892 Unspecified atrial flutter: Secondary | ICD-10-CM | POA: Diagnosis not present

## 2021-04-01 DIAGNOSIS — Z5189 Encounter for other specified aftercare: Secondary | ICD-10-CM | POA: Insufficient documentation

## 2021-04-01 DIAGNOSIS — M898X9 Other specified disorders of bone, unspecified site: Secondary | ICD-10-CM | POA: Insufficient documentation

## 2021-04-01 DIAGNOSIS — Z5111 Encounter for antineoplastic chemotherapy: Secondary | ICD-10-CM | POA: Insufficient documentation

## 2021-04-01 DIAGNOSIS — R5383 Other fatigue: Secondary | ICD-10-CM | POA: Insufficient documentation

## 2021-04-01 DIAGNOSIS — Z5112 Encounter for antineoplastic immunotherapy: Secondary | ICD-10-CM | POA: Insufficient documentation

## 2021-04-01 LAB — ECHOCARDIOGRAM COMPLETE
Area-P 1/2: 3.21 cm2
Calc EF: 63.2 %
S' Lateral: 2.9 cm
Single Plane A2C EF: 63 %
Single Plane A4C EF: 63.3 %

## 2021-04-01 IMAGING — MR MR BREAST BILAT WO/W CM
8 of 13 series · 31 of 48 positions shown · IV contrast (7 ml gadavist)
Comparison: Previous exams.

CLINICAL DATA: 60-year-old female with recently diagnosed grade 2
invasive ductal carcinoma of the right breast post ultrasound-guided
core biopsy of a mass at the 9 o'clock position. A biopsied lymph
node of the right axilla was negative for metastatic disease.
History of explantation of bilateral saline breast implants [T8].

EXAM:
BILATERAL BREAST MRI WITH AND WITHOUT CONTRAST
TECHNIQUE: Multiplanar, multisequence MR images of both breasts were obtained
prior to and following the intravenous administration of 7 ml of
Gadavist

[Series 2: t2_tirm_tra ipat (a-p) · axial · 3.0mm · 0.70mm/px · 1 of 55 slices shown]
[im 1/55]
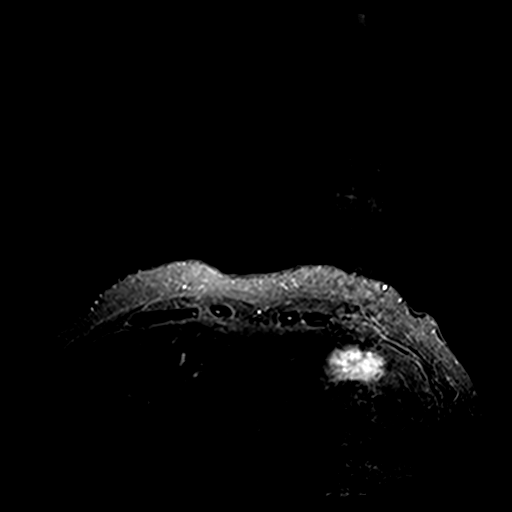

[Series 3: fl3d pre-cm no · axial · non-contrast · 1.2mm · 0.89mm/px · z∈[-80,+91]mm · 5 of 144 slices shown]
[im 1/144]
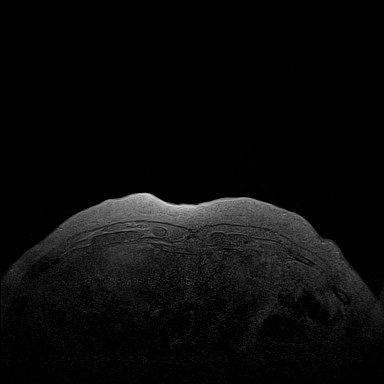
[im 36/144]
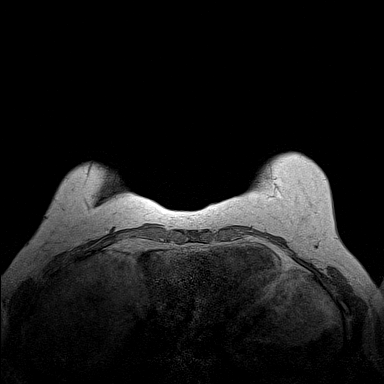
[im 72/144]
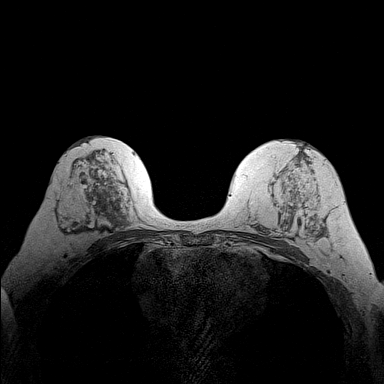
[im 108/144]
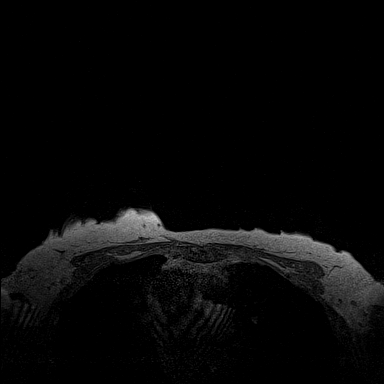
[im 144/144]
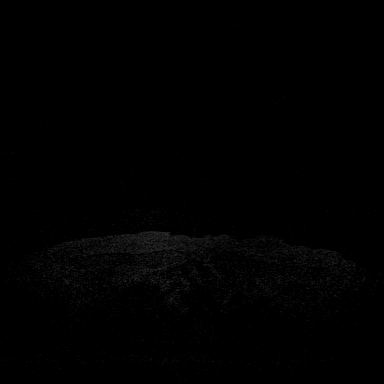

[Series 4: fl3d pre-cm · axial · non-contrast · 1.2mm · 0.89mm/px · z∈[-80,+91]mm · 5 of 144 slices shown]
[im 1/144]
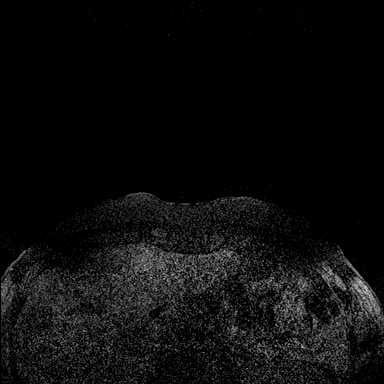
[im 36/144]
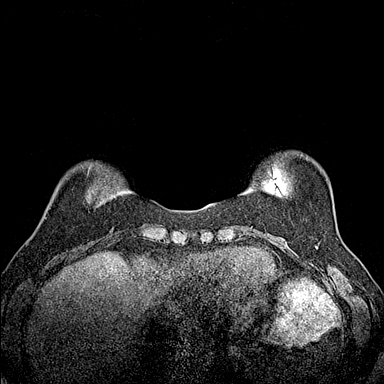
[im 72/144]
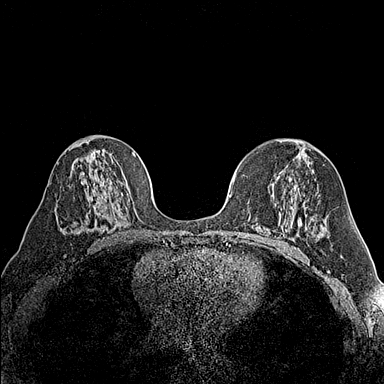
[im 108/144]
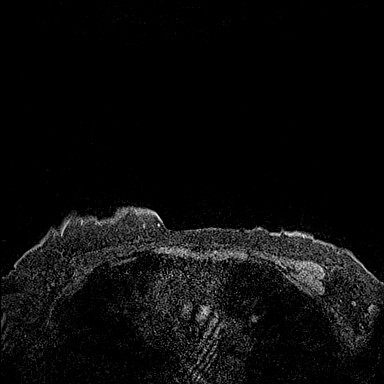
[im 144/144]
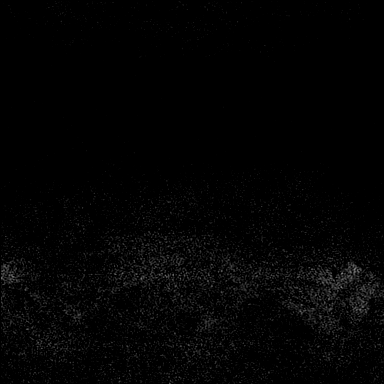

[Series 5: fl3d post-cm 20 · axial · 1.2mm · 0.89mm/px · z∈[-80,+91]mm · 5 of 144 slices shown (1 of 3)]
[im 1/144]
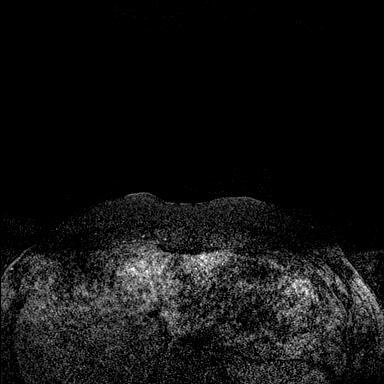
[im 36/144]
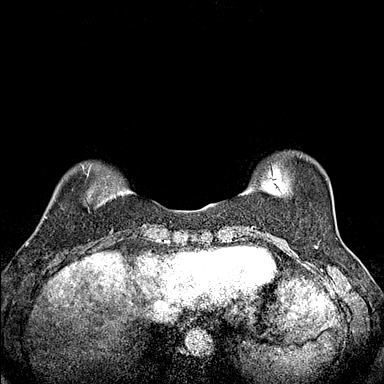
[im 72/144]
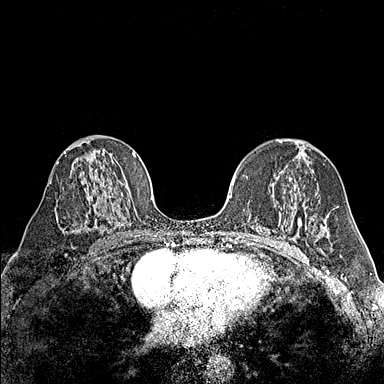
[im 108/144]
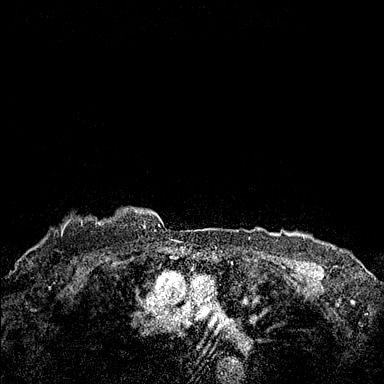
[im 144/144]
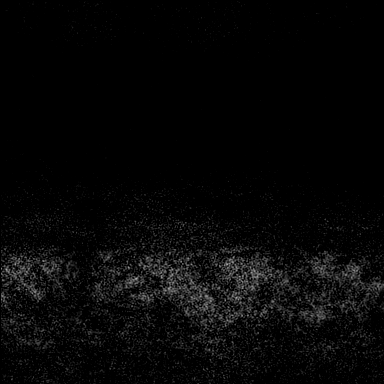

[Series 6: fl3d post-cm 20 · axial · 1.2mm · 0.89mm/px · z∈[-80,+91]mm · 5 of 144 slices shown (2 of 3)]
[im 1/144]
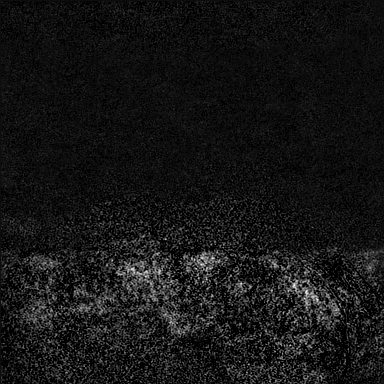
[im 36/144]
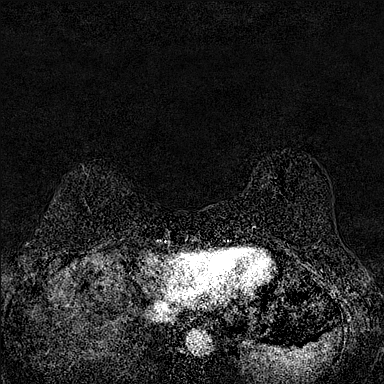
[im 72/144]
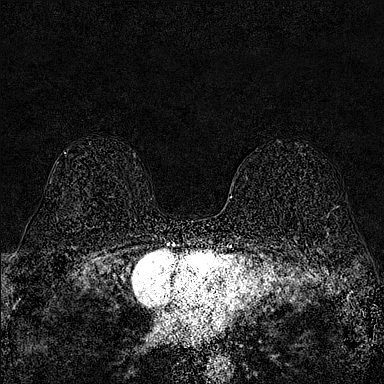
[im 108/144]
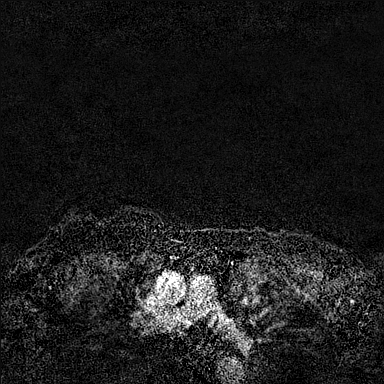
[im 144/144]
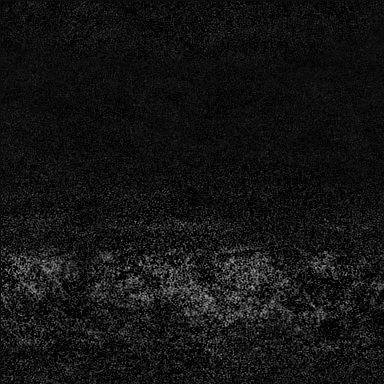

[Series 7: fl3d post-cm 20 · axial · 172.8mm · 0.89mm/px · 1 of 1 slices shown (3 of 3)]
[im 1/1]
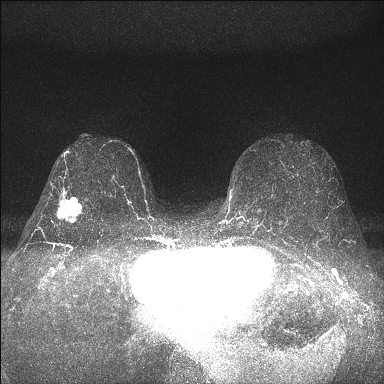

[Series 8: fl3d post-cm 3 · axial · 1.2mm · 0.89mm/px · z∈[-80,+91]mm · 5 of 144 slices shown (1 of 2)]
[im 1/144]
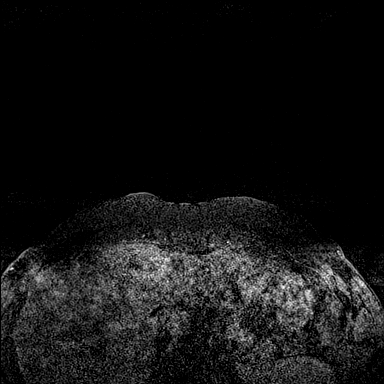
[im 36/144]
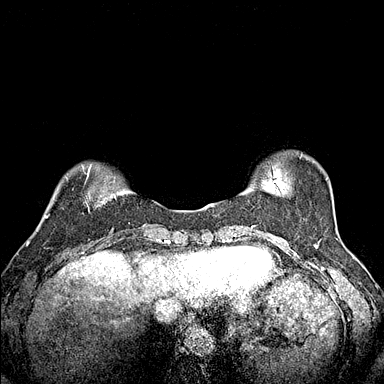
[im 72/144]
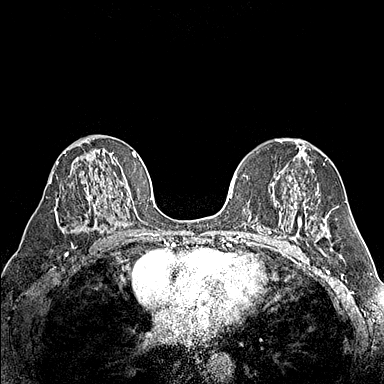
[im 108/144]
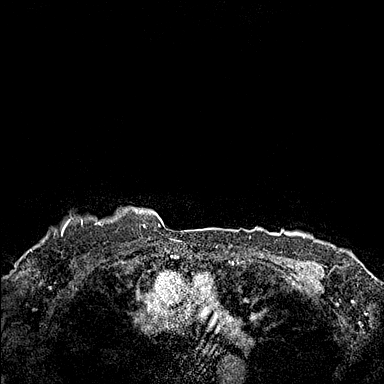
[im 144/144]
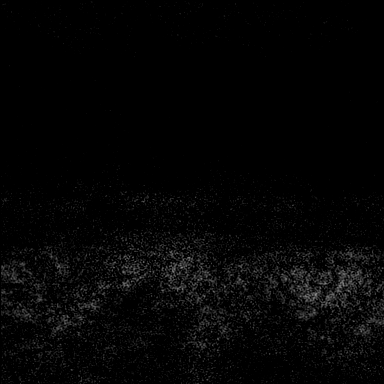

[Series 9: fl3d post-cm 3 · axial · 1.2mm · 0.89mm/px · z∈[-80,+22]mm · 4 of 144 slices shown (2 of 2)]
[im 1/144]
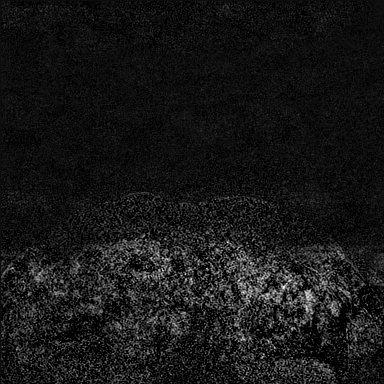
[im 29/144]
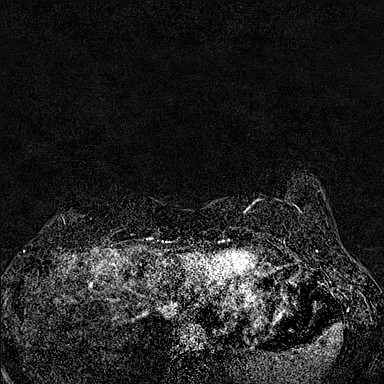
[im 58/144]
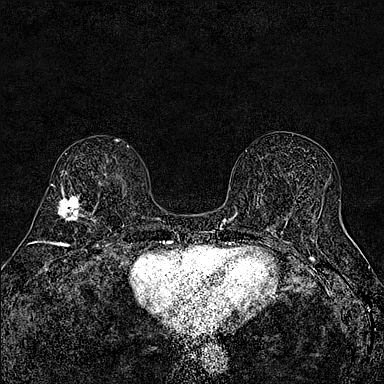
[im 86/144]
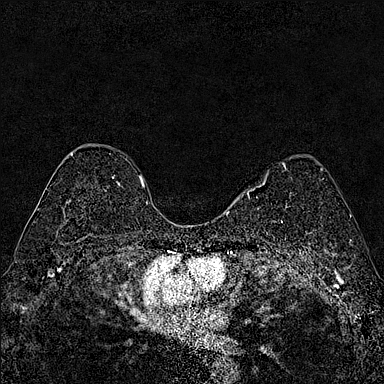

[31 of 48 positions shown; findings below may reference images not displayed]

Three-dimensional MR images were rendered by post-processing of the
original MR data on an independent workstation. The
three-dimensional MR images were interpreted, and findings are
reported in the following complete MRI report for this study. Three
dimensional images were evaluated at the independent interpreting
workstation using the DynaCAD thin client.
FINDINGS: Breast composition: c.  Heterogeneous fibroglandular tissue.

Background parenchymal enhancement: Minimal.

Right breast: Irregular enhancing mass in the outer right breast
containing biopsy marking clip artifacts centrally measures 2.2 cm
AP, 2.2 cm transverse and 2.3 cm craniocaudal. There is linear non
mass enhancement extending anteriorly from the superior aspect of
the dominant mass (image 78), with mass and associated linear
enhancement all together measuring 3.2 cm AP. There are 2 small foci
of enhancement located anterior and medial to the inferior aspect of
the dominant mass in the outer right breast (image 97), both of
which are suspicious for small satellite lesions.

Left breast: No suspicious enhancing masses or abnormal areas of
enhancement in the left breast to suggest malignancy.

Lymph nodes: No morphologically abnormal lymph nodes in either
axilla. Biopsy related changes are seen in the lateral right axilla
from the recently biopsied right axillary lymph node.

Ancillary findings:  None.
IMPRESSION: 1. Biopsy proven malignancy in the upper-outer right breast measures
2.2 x 2.2 x 2.3 cm. There is linear non mass enhancement extending
anteriorly off the superior aspect of this mass with mass and linear
enhancement all together measuring up to 3.2 cm.

2. Small foci of enhancement along the inferior aspect of the
dominant mass in the right breast are suspicious for small satellite
lesions.

3. No MRI evidence of malignancy in the left breast and no
morphologically abnormal axillary lymph nodes.

RECOMMENDATION:
If breast conservation is being considered, then recommend MRI
guided biopsy of the anterior aspect of the linear enhancement in
the outer right breast as well as MRI guided biopsy of one of the
foci along the inferior margin of the dominant mass to better define
disease extent.

BI-RADS CATEGORY  4: Suspicious.

## 2021-04-01 MED ORDER — GADOBUTROL 1 MMOL/ML IV SOLN
7.0000 mL | Freq: Once | INTRAVENOUS | Status: AC | PRN
  Administered 2021-04-01: 7 mL via INTRAVENOUS

## 2021-04-01 NOTE — Progress Notes (Signed)
?  Echocardiogram ?2D Echocardiogram has been performed. ? ?[name] [name] ?04/01/2021, 9:12 AM ?

## 2021-04-01 NOTE — Research (Signed)
[employer] 2021-05 - Specimen Collection Study to Evaluate Biomarkers in Subjects with Cancer  ? ?04/01/21 ? ?Patient was in the clinic for chemo education today.  The patient had to leave for another appointment, but her daughter, [name], stayed behind to meet with this coordinator regarding this study. ?  ?Patient's daughter states the patient wants to participate in this study.  The patient was contacted by telephone and confirmed she would like to participate.  Scheduled for the patient to come to the clinic tomorrow at 11:00am to review and sign consent documents. ? ?Clabe Seal ?Clinical Research Coordinator I  ?04/01/21 11:21 AM ? ?

## 2021-04-01 NOTE — Research (Signed)
[id number] - TREATMENT OF REFRACTORY NAUSEA ? ?04/01/21 ? ?Patient was in the clinic for chemo education today.  The patient had to leave for another appointment, but her daughter, [name], stayed behind to meet with this coordinator regarding this study. ? ?Patient's daughter states the patient wants to participate in this study.  The patient was contacted by telephone and confirmed she would like to participate.  Scheduled for the patient to come to the clinic tomorrow at 11:00am to review and sign consent documents. ? ?Clabe Seal ?Clinical Research Coordinator I  ?04/01/21 11:20 AM ?

## 2021-04-01 NOTE — Progress Notes (Signed)
Surgical Instructions ? ? ? Your procedure is scheduled on Tuesday 04/06/21. ? ? Report to [facility] Main Entrance "A" at 12:00 P.M., then check in with the Admitting office. ? Call this number if you have problems the morning of surgery: ? [phone] ? ? If you have any questions prior to your surgery date call [phone]: Open Monday-Friday 8am-4pm ? ? ? Remember: ? Do not eat after midnight the night before your surgery ? ?You may drink clear liquids until 11:00 A.M. the morning of your surgery.   ?Clear liquids allowed are: Water, Non-Citrus Juices (without pulp), Carbonated Beverages, Clear Tea, Black Coffee ONLY (NO MILK, CREAM OR POWDERED CREAMER of any kind), and Gatorade ?  ? Take these medicines the morning of surgery with A SIP OF WATER:  ?prochlorperazine (COMPAZINE)- If needed ?ondansetron [facility])- If needed ? ? ?As of today, STOP taking any Aspirin (unless otherwise instructed by your surgeon) Aleve, Naproxen, Ibuprofen, Motrin, Advil, Goody's, BC's, all herbal medications, fish oil, and all vitamins. ? ?         ?Do not wear jewelry or makeup ?Do not wear lotions, powders, perfumes/colognes, or deodorant. ?Do not shave 48 hours prior to surgery.  Men may shave face and neck. ?Do not bring valuables to the hospital. ?Do not wear nail polish, gel polish, artificial nails, or any other type of covering on natural nails (fingers and toes) ?If you have artificial nails or gel coating that need to be removed by a nail salon, please have this removed prior to surgery. Artificial nails or gel coating may interfere with anesthesia's ability to adequately monitor your vital signs. ? ?[place] is not responsible for any belongings or valuables. .  ? ?Do NOT Smoke (Tobacco/Vaping)  24 hours prior to your procedure ? ?If you use a CPAP at night, you may bring your mask for your overnight stay. ?  ?Contacts, glasses, hearing aids, dentures or partials may not be worn into surgery, please bring cases for  these belongings ?  ?For patients admitted to the hospital, discharge time will be determined by your treatment team. ?  ?Patients discharged the day of surgery will not be allowed to drive home, and someone needs to stay with them for 24 hours. ? ?NO VISITORS WILL BE ALLOWED IN PRE-OP WHERE PATIENTS ARE PREPPED FOR SURGERY.  ONLY 1 SUPPORT PERSON MAY BE PRESENT IN THE WAITING ROOM WHILE YOU ARE IN SURGERY.  IF YOU ARE TO BE ADMITTED, ONCE YOU ARE IN YOUR ROOM YOU WILL BE ALLOWED TWO (2) VISITORS. 1 (ONE) VISITOR MAY STAY OVERNIGHT BUT MUST ARRIVE TO THE ROOM BY 8pm.  Minor children may have two parents present. Special consideration for safety and communication needs will be reviewed on a case by case basis. ? ?Special instructions:   ? ?Oral Hygiene is also important to reduce your risk of infection.  Remember - BRUSH YOUR TEETH THE MORNING OF SURGERY WITH YOUR REGULAR TOOTHPASTE ? ? ?[place]- Preparing For Surgery ? ?Before surgery, you can play an important role. Because skin is not sterile, your skin needs to be as free of germs as possible. You can reduce the number of germs on your skin by washing with CHG (chlorahexidine gluconate) Soap before surgery.  CHG is an antiseptic cleaner which kills germs and bonds with the skin to continue killing germs even after washing.   ? ? ?Please do not use if you have an allergy to CHG or antibacterial soaps. If your skin  becomes reddened/irritated stop using the CHG.  ?Do not shave (including legs and underarms) for at least 48 hours prior to first CHG shower. It is OK to shave your face. ? ?Please follow these instructions carefully. ?  ? ? Shower the NIGHT BEFORE SURGERY and the MORNING OF SURGERY with CHG Soap.  ? If you chose to wash your hair, wash your hair first as usual with your normal shampoo. After you shampoo, rinse your hair and body thoroughly to remove the shampoo.  Then [employer] and genitals (private parts) with your normal soap and rinse thoroughly  to remove soap. ? ?After that Use CHG Soap as you would any other liquid soap. You can apply CHG directly to the skin and wash gently with a scrungie or a clean washcloth.  ? ?Apply the CHG Soap to your body ONLY FROM THE NECK DOWN.  Do not use on open wounds or open sores. Avoid contact with your eyes, ears, mouth and genitals (private parts). Wash Face and genitals (private parts)  with your normal soap.  ? ?Wash thoroughly, paying special attention to the area where your surgery will be performed. ? ?Thoroughly rinse your body with warm water from the neck down. ? ?DO NOT shower/wash with your normal soap after using and rinsing off the CHG Soap. ? ?Pat yourself dry with a CLEAN TOWEL. ? ?Wear CLEAN PAJAMAS to bed the night before surgery ? ?Place CLEAN SHEETS on your bed the night before your surgery ? ?DO NOT SLEEP WITH PETS. ? ? ?Day of Surgery: ? ?Take a shower with CHG soap. ?Wear Clean/Comfortable clothing the morning of surgery ?Do not apply any deodorants/lotions.   ?Remember to brush your teeth WITH YOUR REGULAR TOOTHPASTE. ? ? ? ?COVID testing ? ?If you are going to stay overnight or be admitted after your procedure/surgery and require a pre-op COVID test, please follow these instructions after your COVID test  ? ?You are not required to quarantine however you are required to wear a well-fitting mask when you are out and around people not in your household.  If your mask becomes wet or soiled, replace with a new one. ? ?Wash your hands often with soap and water for 20 seconds or clean your hands with an alcohol-based hand sanitizer that contains at least 60% alcohol. ? ?Do not share personal items. ? ?Notify your provider: ?if you are in close contact with someone who has COVID  ?or if you develop a fever of 100.4 or greater, sneezing, cough, sore throat, shortness of breath or body aches. ? ?  ?Please read over the following fact sheets that you were given.  ? ?

## 2021-04-02 ENCOUNTER — Encounter (HOSPITAL_COMMUNITY)
Admission: RE | Admit: 2021-04-02 | Discharge: 2021-04-02 | Disposition: A | Discharge status: Home / Self Care | Payer: No Typology Code available for payment source | Source: Ambulatory Visit | Attending: General Surgery | Admitting: General Surgery

## 2021-04-02 ENCOUNTER — Other Ambulatory Visit: Payer: Self-pay | Admitting: General Surgery

## 2021-04-02 ENCOUNTER — Encounter (HOSPITAL_COMMUNITY): Payer: Self-pay

## 2021-04-02 ENCOUNTER — Inpatient Hospital Stay: Payer: No Typology Code available for payment source | Admitting: Emergency Medicine

## 2021-04-02 ENCOUNTER — Other Ambulatory Visit: Payer: Self-pay

## 2021-04-02 ENCOUNTER — Telehealth: Payer: Self-pay | Admitting: *Deleted

## 2021-04-02 ENCOUNTER — Encounter: Payer: Self-pay | Admitting: Hematology and Oncology

## 2021-04-02 ENCOUNTER — Other Ambulatory Visit: Payer: Self-pay | Admitting: *Deleted

## 2021-04-02 ENCOUNTER — Encounter: Payer: Self-pay | Admitting: *Deleted

## 2021-04-02 DIAGNOSIS — C50411 Malignant neoplasm of upper-outer quadrant of right female breast: Secondary | ICD-10-CM

## 2021-04-02 DIAGNOSIS — Z170 Estrogen receptor positive status [ER+]: Secondary | ICD-10-CM

## 2021-04-02 DIAGNOSIS — Z01810 Encounter for preprocedural cardiovascular examination: Secondary | ICD-10-CM | POA: Insufficient documentation

## 2021-04-02 HISTORY — DX: Other specified postprocedural states: Z98.890

## 2021-04-02 HISTORY — DX: Nausea with vomiting, unspecified: R11.2

## 2021-04-02 NOTE — Research (Signed)
[employer] 2021-05 - Specimen Collection Study to Evaluate Biomarkers in Subjects with Cancer  ? ?04/02/21 ? ?Informed Consent:  ?Patient [name] [name] was identified by Dr. Chryl Heck as a potential candidate for the above listed study.  This Clinical Research Coordinator met with [name] [name], MRN [id number], on 04/02/21 in a manner and location that ensures patient privacy to discuss participation in the above listed research study.  Patient is Accompanied by [name] .  A copy of the informed consent document with embedded HIPAA language was provided to the patient.  Patient reads, speaks, and understands [place].   ? ?Patient was previously provided with informed consent documents.  Patient confirmed they have read the informed consent documents. ? ?As outlined in the informed consent form, this Coordinator and Liani Caris discussed the purpose of the research study, the investigational nature of the study, study procedures and requirements for study participation, potential risks and benefits of study participation, as well as alternatives to participation. This study is not blinded. The patient understands participation is voluntary and they may withdraw from study participation at any time.  This study does not involve randomization.  This study does not involve an investigational drug or device. This study does not involve a placebo. Patient understands enrollment is pending full eligibility review.  ? ?Confidentiality and how the patient's information will be used as part of study participation were discussed.  Patient was informed there is reimbursement provided for their time and effort spent on trial participation.  The patient is encouraged to discuss research study participation with their insurance provider to determine what costs they may incur as part of study participation, including research related injury.   ? ?All questions were answered to patient's satisfaction.  The informed  consent with embedded HIPAA language was reviewed page by page.  [name] mental and emotional status is appropriate to provide informed consent, and the patient verbalizes an understanding of study participation.  Patient has agreed to participate in the above listed research study and has voluntarily signed the informed consent version revised 22 Feb 2021 with embedded HIPAA language, version 22 Feb 2021  on 04/02/21 at 10:35AM.  The patient was provided with a copy of the signed informed consent form with embedded HIPAA language for their reference.  No study specific procedures were obtained prior to the signing of the informed consent document.  Approximately 10 minutes were spent with the patient reviewing the informed consent documents.  Patient was not requested to complete a Release of Information form. ? ?Data Collection:  ?This patient denies past medical history of high blood pressure, coronary artery disease, lupus, rheumatoid arthritis, diabetes, lynch syndrome.  This patient is not taking magnesium supplements.  The patient reports no family history of cancer in 1st or 2nd degree relatives.  The patient reports history of alcohol consumption currently and for the past 10 years.  She reports an average of 14 alcoholic drinks per week.  The patient reports no personal history of tobacco use. ? ?Plan:  ?Plan to collect blood specimen next Wednesday when patient comes for cycle 1 of chemotherapy. ? ? ?Clabe Seal ?Clinical Research Coordinator I  ?04/02/21 10:55 AM ?

## 2021-04-02 NOTE — Research (Signed)
[id number] - TREATMENT OF REFRACTORY NAUSEA  04/02/21  Informed Consent: Patient [name] [name] was identified by Dr. Chryl Heck as a potential candidate for the above listed study.  This Clinical Research Coordinator met with [name] [name], [id number], on 04/02/21 in a manner and location that ensures patient privacy to discuss participation in the above listed research study.  Patient is Accompanied by [name] .  A copy of the informed consent document and separate HIPAA Authorization was provided to the patient.  Patient reads, speaks, and understands [place].    Patient was previously provided with informed consent documents.  Patient confirmed they have read the informed consent documents.  As outlined in the informed consent form, this Coordinator and Ewa Hipp discussed the purpose of the research study, the investigational nature of the study, study procedures and requirements for study participation, potential risks and benefits of study participation, as well as alternatives to participation.  This study is blinded or double-blinded. The patient understands participation is voluntary and they may withdraw from study participation at any time.  Each study arm was reviewed, and randomization discussed.  Potential side effects were reviewed with patient as outlined in the consent form, and patient made aware there may be side effects not yet known. The chance of receiving placebo was discussed. Patient understands enrollment is pending full eligibility review.   Confidentiality and how the patient's information will be used as part of study participation were discussed.  Patient was informed there is not reimbursement provided for their time and effort spent on trial participation.  The patient is encouraged to discuss research study participation with their insurance provider to determine what costs they may incur as part of study participation, including research related injury.     All questions were answered to patient's satisfaction.  The informed consent and separate HIPAA Authorization was reviewed page by page.  [name] mental and emotional status is appropriate to provide informed consent, and the patient verbalizes an understanding of study participation.  Patient has agreed to participate in the above listed research study and has voluntarily signed the informed consent protocol version date 01/27/2021, [place] active date 02/24/2021 and separate HIPAA Authorization, version 04/11/16, approved 08/04/2021  on 04/02/21 at 10:13AM.  The patient was provided with a copy of the signed informed consent form and separate HIPAA Authorization for their reference.  No study specific procedures were obtained prior to the signing of the informed consent document.  Approximately 15 minutes were spent with the patient reviewing the informed consent documents.  Patient was not requested to complete a Release of Information form.  The patient consented to having voicemail messages left on her phone, and also consented to optional storage of leftover blood for future laboratory studies.  The patient agreed to complete questionnaires via email link and requested the link be sent to [email or web address].  She was given paper versions of the baseline questionnaires and instructed to fill out only if she is unable to complete via the emailed link.  Eligibility:  This Coordinator has reviewed this patient's inclusion and exclusion criteria and confirmed [name] [name] is eligible for study participation.  Patient will continue with enrollment.  Patient reports she has had an evaluation for heart palpitations in the past, which was normal and she was not diagnosed with any arrhythmia.  She is not currently having palpitations.    Menopausal status (women only): [name] [name] is post menopausal and has been so for greater than one year.  Plan:  Patient is scheduled to start cycle  1 of chemo on Wednesday, 04/07/21.  Clabe Seal Clinical Research Coordinator I  04/02/21 10:51 AM

## 2021-04-02 NOTE — Telephone Encounter (Signed)
Spoke with patient regarding the add bx needed.  Explained the reason for getting the bx prior to chemo start. Patient was unhappy about getting the bx but agreed to proceed.  ?

## 2021-04-02 NOTE — Progress Notes (Signed)
PCP - Zack Seal ?Cardiologist - denies ? ?PPM/ICD - denies ? ? ?Chest x-ray - n/a ?EKG - 04/02/21 ?Stress Test - denies ?ECHO - 12/06/18 ?Cardiac Cath - denies ? ?Sleep Study - denies ? ? ?Patient instructed to hold all Aspirin, NSAID's, herbal medications, fish oil and vitamins 7 days prior to surgery. ? ? ?ERAS Protcol -yes, no drink ? ? ?COVID TEST- ambulatory surgery ? ? ?Anesthesia review: no ? ?Patient denies shortness of breath, fever, cough and chest pain at PAT appointment ? ? ?All instructions explained to the patient, with a verbal understanding of the material. Patient agrees to go over the instructions while at home for a better understanding. Patient also instructed to self quarantine after being tested for COVID-19. The opportunity to ask questions was provided. ? ? ?

## 2021-04-05 ENCOUNTER — Telehealth: Payer: Self-pay | Admitting: Genetic Counselor

## 2021-04-05 ENCOUNTER — Ambulatory Visit
Admission: RE | Admit: 2021-04-05 | Discharge: 2021-04-05 | Disposition: A | Discharge status: Home / Self Care | Payer: No Typology Code available for payment source | Source: Ambulatory Visit | Attending: General Surgery | Admitting: General Surgery

## 2021-04-05 ENCOUNTER — Other Ambulatory Visit: Payer: Self-pay

## 2021-04-05 ENCOUNTER — Ambulatory Visit: Payer: Self-pay | Admitting: Genetic Counselor

## 2021-04-05 DIAGNOSIS — C50411 Malignant neoplasm of upper-outer quadrant of right female breast: Secondary | ICD-10-CM

## 2021-04-05 DIAGNOSIS — Z170 Estrogen receptor positive status [ER+]: Secondary | ICD-10-CM

## 2021-04-05 DIAGNOSIS — Z1379 Encounter for other screening for genetic and chromosomal anomalies: Secondary | ICD-10-CM

## 2021-04-05 HISTORY — PX: BREAST BIOPSY: SHX20

## 2021-04-05 IMAGING — MR MR BREAST BX W/ LOC DEV 1ST LEASION IMAGE BX SPEC MR GUIDE*R*
7 of 10 series · 31 of 48 positions shown · IV contrast (7 ML GADAVIST)
Comparison: Previous exams.
COMPARISON: Previous exams.

Addendum:
CLINICAL DATA: Patient presents for MR guided core biopsy of 2
sites in the RIGHT breast.

EXAM:
MRI GUIDED CORE NEEDLE BIOPSY OF THE RIGHT BREAST x2
TECHNIQUE: Multiplanar, multisequence MR imaging of the RIGHT breast was
performed both before and after administration of intravenous
contrast.
CONTRAST:  7mL GADAVIST GADOBUTROL 1 MMOL/ML IV SOLN

[Series 3: fiducial unilateral · sagittal · 2.0mm · 1.33mm/px · 2 of 48 slices shown]
[im 1/48]
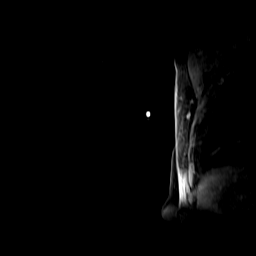
[im 48/48]
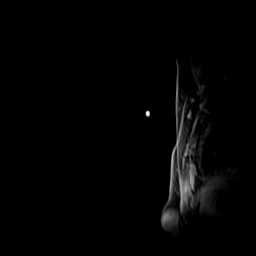

[Series 4: dynamic pre · axial · non-contrast · 1.3mm · 0.73mm/px · z∈[-152,+118]mm · 6 of 208 slices shown]
[im 1/208]
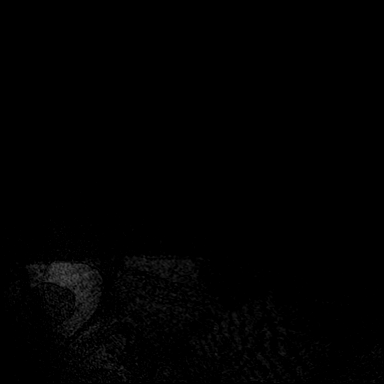
[im 42/208]
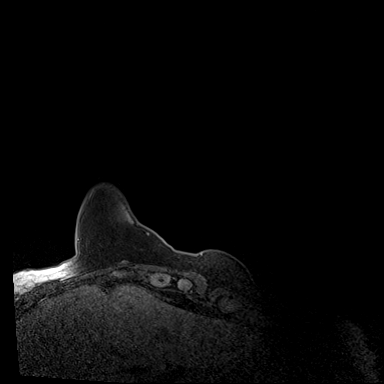
[im 83/208]
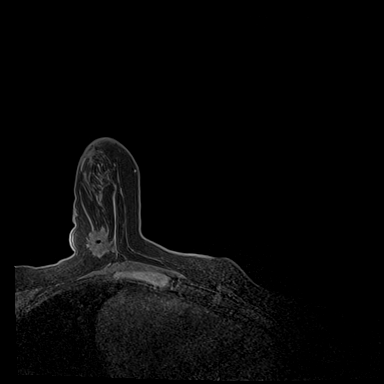
[im 125/208]
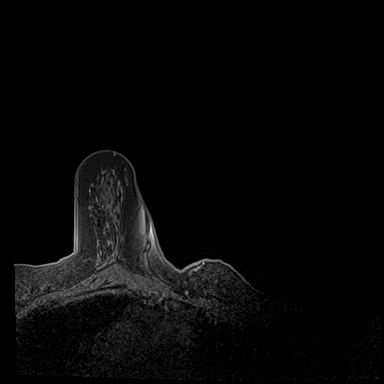
[im 166/208]
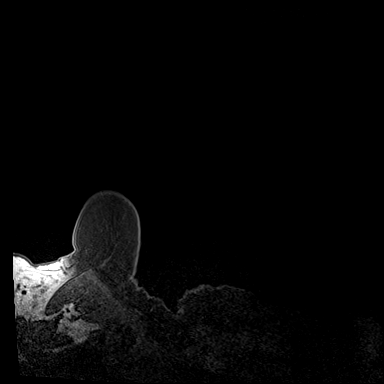
[im 208/208]
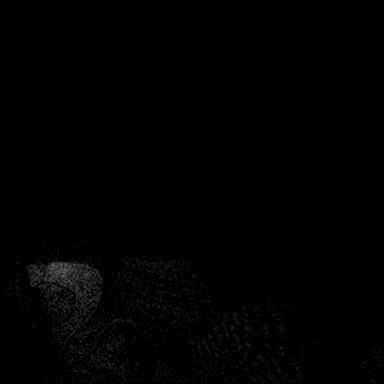

[Series 5: dynamic post 20 · axial · 1.3mm · 0.73mm/px · z∈[-152,+118]mm · 5 of 208 slices shown (1 of 2)]
[im 1/208]
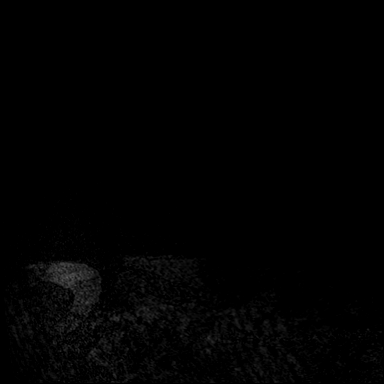
[im 52/208]
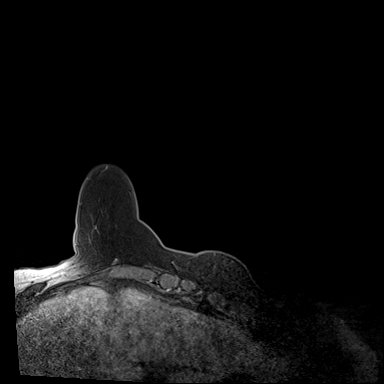
[im 104/208]
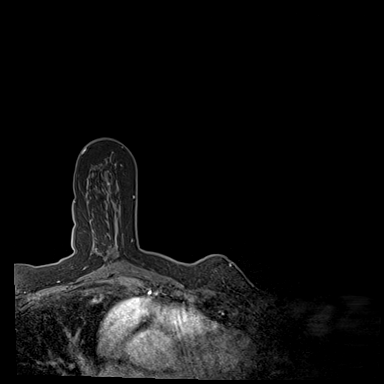
[im 156/208]
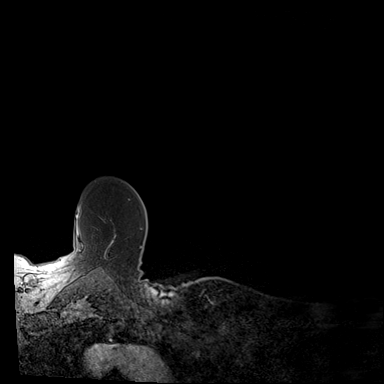
[im 208/208]
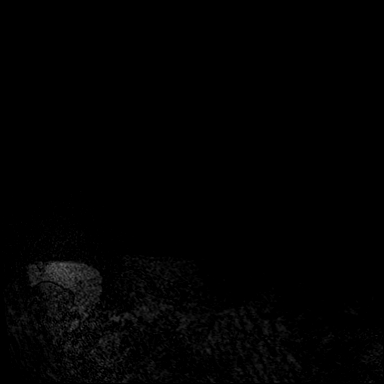

[Series 6: dynamic post 20 · axial · 1.3mm · 0.73mm/px · z∈[-152,+118]mm · 5 of 208 slices shown (2 of 2)]
[im 1/208]
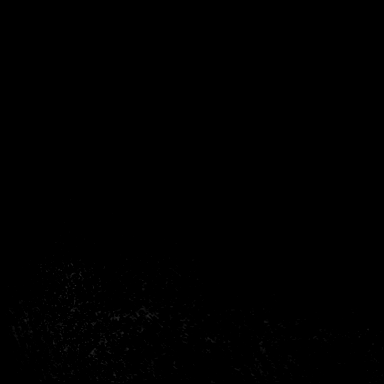
[im 52/208]
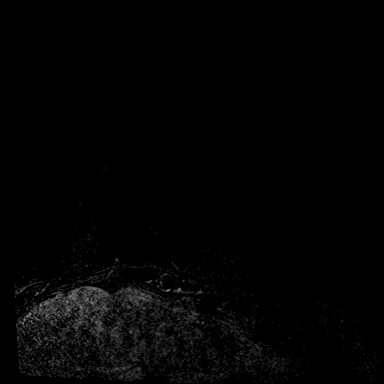
[im 104/208]
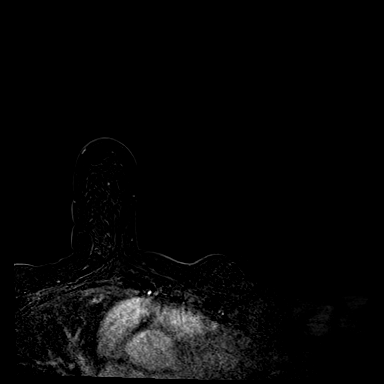
[im 156/208]
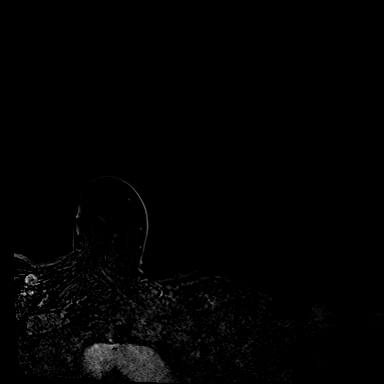
[im 208/208]
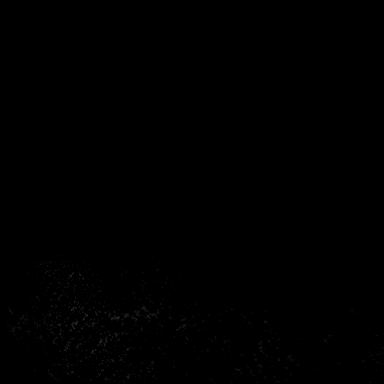

[Series 7: dynamic post 3 · axial · 1.3mm · 0.73mm/px · z∈[-152,+118]mm · 5 of 208 slices shown (1 of 2)]
[im 1/208]
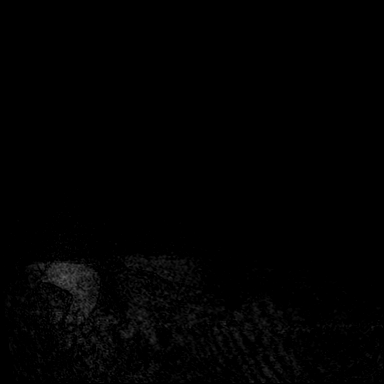
[im 52/208]
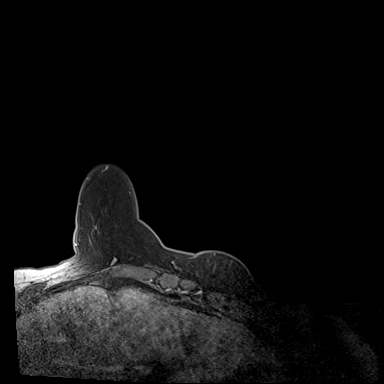
[im 104/208]
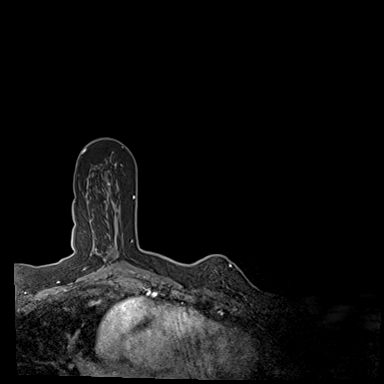
[im 156/208]
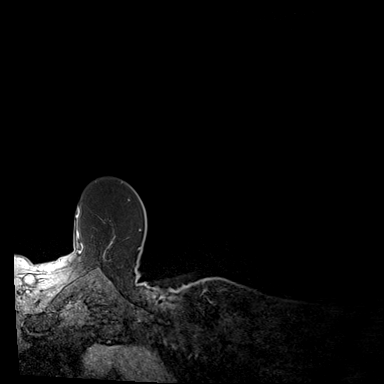
[im 208/208]
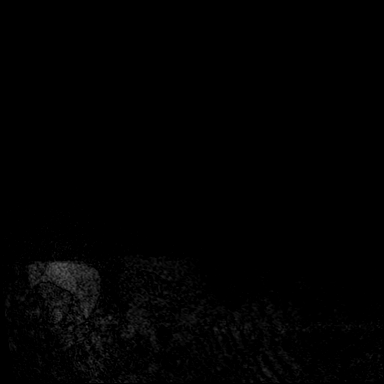

[Series 8: dynamic post 3 · axial · 1.3mm · 0.73mm/px · z∈[-152,+118]mm · 5 of 208 slices shown (2 of 2)]
[im 1/208]
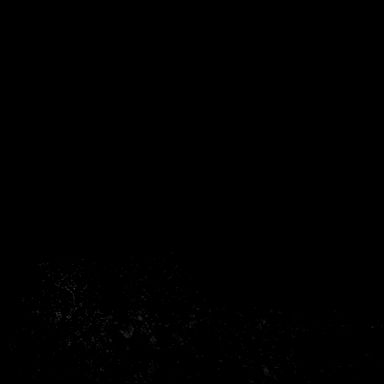
[im 52/208]
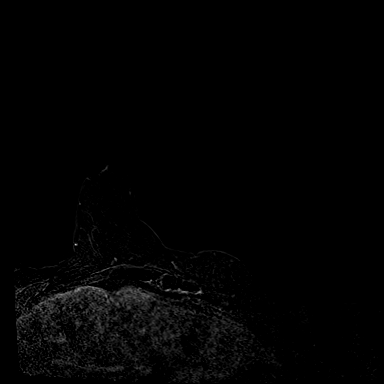
[im 104/208]
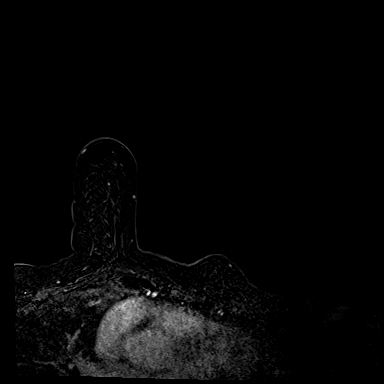
[im 156/208]
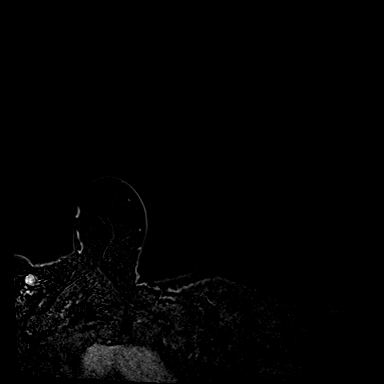
[im 208/208]
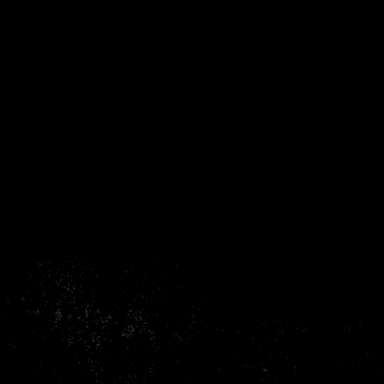

[Series 9: needle confirmation · axial · 1.3mm · 0.73mm/px · z∈[-152,-18]mm · 3 of 208 slices shown]
[im 1/208]
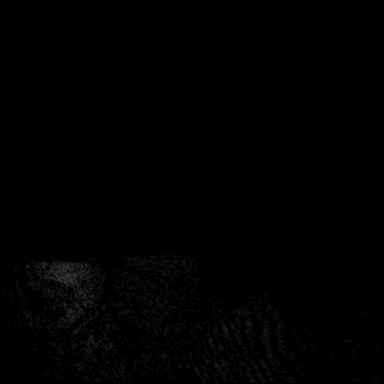
[im 52/208]
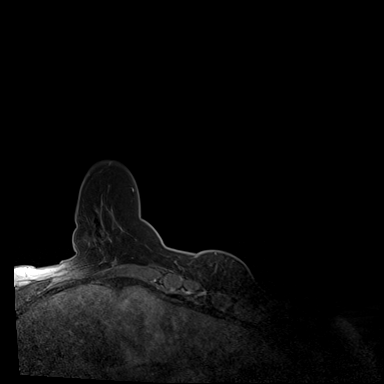
[im 104/208]
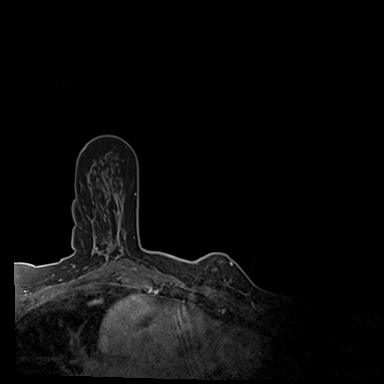

[31 of 48 positions shown; findings below may reference images not displayed]

FINDINGS: I met with the patient, and we discussed the procedure of MRI guided
biopsy, including risks, benefits, and alternatives. Specifically,
we discussed the risks of infection, bleeding, tissue injury, clip
migration, and inadequate sampling. Informed, written consent was
given. The usual time out protocol was performed immediately prior
to the procedure.

Site 1: Lesion quadrant: UPPER-OUTER QUADRANT RIGHT breast, anterior
and superior lesions, barbell clip

Using sterile technique and 1% lidocaine as local anesthetic, under
MRI guidance, a 9 gauge vacuum assisted device was used to perform
core needle biopsy of linear enhancement anterior to the known
malignancy using a LATERAL approach.

At the conclusion of the procedure, barbell tissue marker clip was
deployed into the biopsy cavity.

Site 2: Lesion quadrant: LOWER posterior central, cylinder clip

Using sterile technique and 1% lidocaine as local anesthetic, under
MRI guidance, a 9 gauge vacuum assisted device was used to perform
core needle biopsy of small satellite lesion in the LOWER posterior
central aspect of the RIGHT breast using a LATERAL approach.

At the conclusion of the procedure, cylinder tissue marker clip was
deployed into the biopsy cavity.

Follow-up RIGHT 2-view mammogram was performed and dictated
separately.
IMPRESSION: MRI guided biopsy of 2 areas of concern in the RIGHT breast. No
apparent complications.

ADDENDUM:
Pathology revealed GRADE II INVASIVE DUCTAL CARCINOMA of the RIGHT
breast, anterior superior (barbell clip). This was found to be
concordant by Dr. ARISSA.

Pathology revealedMILD FIBROCYSTIC CHANGE- NEGATIVE FOR CARCINOMA of
the RIGHT breast, posterior inferior (cylinder clip). This was found
to be concordant by Dr. ARISSA.

Pathology results were discussed with the patient by telephone. The
patient reported bruising and tenderness at biopsy sites. Post
biopsy instructions and care were reviewed and questions were
answered. The patient was encouraged to call The [REDACTED] of

The patient has a recent diagnosis of RIGHT breast cancer and should
follow her outlined treatment plan.

Pathology results reported by ARISSA RN on [DATE].

*** End of Addendum ***
FINDINGS: I met with the patient, and we discussed the procedure of MRI guided
biopsy, including risks, benefits, and alternatives. Specifically,
we discussed the risks of infection, bleeding, tissue injury, clip
migration, and inadequate sampling. Informed, written consent was
given. The usual time out protocol was performed immediately prior
to the procedure.

Site 1: Lesion quadrant: UPPER-OUTER QUADRANT RIGHT breast, anterior
and superior lesions, barbell clip

Using sterile technique and 1% lidocaine as local anesthetic, under
MRI guidance, a 9 gauge vacuum assisted device was used to perform
core needle biopsy of linear enhancement anterior to the known
malignancy using a LATERAL approach.

At the conclusion of the procedure, barbell tissue marker clip was
deployed into the biopsy cavity.

Site 2: Lesion quadrant: LOWER posterior central, cylinder clip

Using sterile technique and 1% lidocaine as local anesthetic, under
MRI guidance, a 9 gauge vacuum assisted device was used to perform
core needle biopsy of small satellite lesion in the LOWER posterior
central aspect of the RIGHT breast using a LATERAL approach.

At the conclusion of the procedure, cylinder tissue marker clip was
deployed into the biopsy cavity.

Follow-up RIGHT 2-view mammogram was performed and dictated
separately.
IMPRESSION: MRI guided biopsy of 2 areas of concern in the RIGHT breast. No
apparent complications.

## 2021-04-05 MED ORDER — GADOBUTROL 1 MMOL/ML IV SOLN
7.0000 mL | Freq: Once | INTRAVENOUS | Status: AC | PRN
  Administered 2021-04-05: 7 mL via INTRAVENOUS

## 2021-04-05 NOTE — H&P (Signed)
?REFERRING PHYSICIAN: Arvella Nigh ? ?PROVIDER: Georgianne Fick, MD ? ?Care Team: Patient Care Team: ?None as PCP - General ?Georgianne Fick, MD as Consulting Provider (Surgical Oncology) ?Iruku, Flo Shanks, MD (Hematology and Oncology) ?Marye Round, MD (Radiation Oncology) ?McComb, Gwynn Burly, MD (Obstetrics and Gynecology) ?Brain Hilts, MD (Internal Medicine)  ? ?MRN: [id number] ?DOB: 1960/08/14 ?DATE OF ENCOUNTER: 03/17/2021 ? ?Subjective  ? ?Chief Complaint: Breast Cancer ? ? ?History of Present Illness: ?[name] [name] is a 61 y.o. female who is seen today as an office consultation at the request of Dr. Pasty Arch for evaluation of Breast Cancer ? ?Pt has a new diagnosis of right breast cancer 02/2021. She had a palpable mass. Diagnostic imaging confirmed this and showed a 2.2 cm mass at 9 o'clock. 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 There was an enlarged node, but biopsy of node was benign and concordant.  ? ?Of note, she has had issues with h/o saline implants that were removed in 2021. She had had capsular contractures that Dr. Dessie Coma addressed by releasing this manually in clinic. She had them removed to eliminate need for this. She thinks there was something there at that point in 2021, but it was felt to be scar tissue. It is definitely more prominent.  ? ?Menarche was age 59. She is a G2P2 with first child at age 45. She used hormonal contraception for around 8 years. She is post menopausal since around age 63.  ? 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  ? ?Diagnostic mammogram/ultrasound: 03/04/2021 ?ACR Breast Density Category c: The breast tissue is heterogeneously ?dense, which may obscure small masses. ?  ?FINDINGS: ?There is an irregular mass with associated architectural distortion ?and punctate calcifications in the lateral right breast, ?corresponding to the palpable abnormality. ?  ?There are no  other breast masses, no other areas of architectural ?distortion and no other suspicious calcifications. ?  ?On physical exam, there is a firm but mobile palpable mass in the ?upper outer right breast. ?  ?Targeted right breast ultrasound is performed, showing an irregular ?hypoechoic vascular mass with irregular and ill-defined margins, ?centered at 9 o'clock, 5 cm the nipple, corresponding to the ?palpable abnormality. The mass measures 2.2 x 2.0 x 2.1 cm. ?  ?Sonographic evaluation of the right axilla demonstrates 1 lymph node ?with a mildly thickened cortex measuring 4 mm. ?  ?IMPRESSION: ?1. 2.2 cm highly suspicious mass in the right breast at 9 o'clock. ?Tissue sampling is indicated. ?2. Single abnormal right axillary lymph node with a mildly thickened ?cortex measuring 4 mm. Tissue sampling is recommended. ?  ?RECOMMENDATION: ?1. Ultrasound-guided core needle biopsy of the 9 o'clock position ?right breast mass. ?2. Ultrasound-guided core needle biopsy of the single abnormal right ?axillary lymph node. ?  ?These procedures for scheduled for 03/09/2021. ?  ?I have discussed the findings and recommendations with the patient. ?If applicable, a reminder letter will be sent to the patient ?regarding the next appointment. ?  ?BI-RADS CATEGORY 5: Highly suggestive of malignancy. ? ?Pathology core needle biopsy: 03/09/2021 ?1. Breast, right, needle core biopsy, right breast 2.2 cm mass 9:00, 5 cmfn ribbon clip ?- INVASIVE DUCTAL CARCINOMA ?2. Lymph node, needle/core biopsy, right axillary lymph node tribell clip ?- NO CARCINOMA IDENTIFIED IN NODAL TISSUE ?1. Based on the biopsy, the carcinoma appears Nottingham grade 2 of 3 and measures 1.6 cm in greatest linear extent. ? ?Receptors: ?ER 100%  strong ?PR 70% strog ?Ki 67 25% ?Her 2 positive ? ?Review of Systems: ?A complete review of systems was obtained from the patient. I have reviewed this information and discussed as appropriate with the patient. See HPI as well for  other ROS. ? ?Review of Systems  ?HENT: Positive for tinnitus.  ?Genitourinary:  ?H/o UTIs  ?Skin: Positive for rash.  ?Endo/Heme/Allergies:  ?Hot flashes  ?All other systems reviewed and are negative. ? ? ?Medical History: ?Past Medical History:  ?Diagnosis Date  ? History of cancer  ? ?Patient Active Problem List  ?Diagnosis  ? Malignant neoplasm of upper-outer quadrant of right breast in female, estrogen receptor positive (CMS-HCC)  ? ?Past Surgical History:  ?Procedure Laterality Date  ? breast implants  ?2002  ? CHOLECYSTECTOMY N/A  ?1993  ? JOINT REPLACEMENT  ? ? ?Allergies  ?Allergen Reactions  ? Venom-Honey Bee Swelling  ? ?Current Outpatient Medications on File Prior to Visit  ?Medication Sig Dispense Refill  ? multivitamin with minerals tablet Take 1 tablet by mouth once daily  ? ?No current facility-administered medications on file prior to visit.  ? ?Family History  ?Problem Relation Age of Onset  ? Stroke Mother  ? ? ?Social History  ? ?Tobacco Use  ?Smoking Status Never  ?Smokeless Tobacco Never  ? ? ?Social History  ? ?Socioeconomic History  ? Marital status: Married  ?Tobacco Use  ? Smoking status: Never  ? Smokeless tobacco: Never  ?Vaping Use  ? Vaping Use: Never used  ?Substance and Sexual Activity  ? Alcohol use: Yes  ?Comment: "10 glasses per week"  ? Drug use: Not Currently  ? ?Objective:  ? ?Vitals:  ?03/17/21 1231  ?BP: (!) 172/92  ?Pulse: 71  ?Resp: 18  ?Temp: 36.3 ?C (97.3 ?F)  ?Weight: 68 kg (150 lb)  ?Height: 157.5 cm ('5\' 2"'$ )  ? ?Body mass index is 27.44 kg/m?. ? ?Gen: No acute distress. Well nourished and well groomed.  ?Neurological: Alert and oriented to person, place, and time. Coordination normal.  ?Head: Normocephalic and atraumatic.  ?Eyes: Conjunctivae are normal. Pupils are equal, round, and reactive to light. No scleral icterus.  ?Neck: Normal range of motion. Neck supple. No tracheal deviation or thyromegaly present.  ?Cardiovascular: Normal rate, regular rhythm, normal heart  sounds and intact distal pulses. Exam reveals no gallop and no friction rub. No murmur heard. ?Breast: palpable mass 9 o'clock around 2.5 cm. + palpable LN. Mass is mobile. There is a skin indention at the location of the mass. No nipple retraction or skin dimpling. No nipple discharge. Left side benign. Size relatively symmetric. Mild to moderate ptosis.  ?Respiratory: Effort normal. No respiratory distress. No chest wall tenderness. Breath sounds normal. No wheezes, rales or rhonchi.  ?GI: Soft. Bowel sounds are normal. The abdomen is soft and nontender. There is no rebound and no guarding.  ?Musculoskeletal: Normal range of motion. Extremities are nontender.  ?Lymphadenopathy: No cervical, preauricular, postauricular or axillary adenopathy is present ?Skin: Skin is warm and dry. No rash noted. No diaphoresis. No erythema. No pallor. No clubbing, cyanosis, or edema.  ?Psychiatric: Normal mood and affect. Behavior is normal. Judgment and thought content normal.  ? ?Labs ?CBC, CMET essentially normal 03/17/2021 ? ?Assessment and Plan:  ? ?Malignant neoplasm of upper-outer quadrant of right breast in female, estrogen receptor positive (CMS-HCC) ?Pt has new diagnosis of cT2N0 right breast cancer, triple positive. We will plan port placement. I reviewed risks of this including pneumothorax, malposition, malfunction, infection, clot, and  more. Her daughter was also present. She has just completed PA school.  ? ?I also discussed eventual lumpectomy and sentinel node biopsy. She will likely need a seed at that point as it is likely that the mass will become non palpable. She is getting an MRI pre treatment.  ? ?Surgery will be followed by radiation and antihormonal tx.  ? ?No follow-ups on file. ? ?Epic orders written and posting sheet sent.  ? ?Milus Height, MD FACS ?Surgical Oncology, General Surgery, Trauma and Critical Care ?

## 2021-04-05 NOTE — Progress Notes (Signed)
HPI:   ?[name] [name] was previously seen in the [facility] due to a personal history of cancer and concerns regarding a hereditary predisposition to cancer. Please refer to our prior cancer genetics clinic note for more information regarding our discussion, assessment and recommendations, at the time. [name] [name] recent genetic test results were disclosed to her, as were recommendations warranted by these results. These results and recommendations are discussed in more detail below. ? ?CANCER HISTORY:  ?Oncology History  ?Malignant neoplasm of upper-outer quadrant of right breast in female, estrogen receptor positive ([facility])  ?03/16/2021 Initial Diagnosis  ? Malignant neoplasm of upper-outer quadrant of right breast in female, estrogen receptor positive ([facility]) ?  ?04/07/2021 -  Chemotherapy  ? Patient is on Treatment Plan : BREAST  Docetaxel + Carboplatin + Trastuzumab + Pertuzumab  (TCHP) q21d   ?   ? Genetic Testing  ? [facility] CustomNext Panel was Negative. Report date is 03/28/2021. ? ?The CustomNext-Cancer+RNAinsight panel offered by [facility] includes sequencing and rearrangement analysis for the following 47 genes:  APC, ATM, AXIN2, BARD1, BMPR1A, BRCA1, BRCA2, BRIP1, CDH1, CDK4, CDKN2A, CHEK2, CTNNA1, DICER1, EPCAM, GREM1, HOXB13, KIT, MEN1, MLH1, MSH2, MSH3, MSH6, MUTYH, NBN, NF1, NTHL1, PALB2, PDGFRA, PMS2, POLD1, POLE, PTEN, RAD50, RAD51C, RAD51D, SDHA, SDHB, SDHC, SDHD, SMAD4, SMARCA4, STK11, TP53, TSC1, TSC2, and VHL.  RNA data is routinely analyzed for use in variant interpretation for all genes. ?  ? ? ?FAMILY HISTORY:  ?We obtained a detailed, 4-generation family history.  Significant diagnoses are listed below: ?     ?Family History  ?Problem Relation Age of Onset  ? Heart attack Mother    ?  ?Ms. Gates does not have a family history of cancer and she is unaware of previous family history of genetic testing for hereditary cancer risks. There is no reported Ashkenazi Jewish  ancestry. ?  ?  ? ?GENETIC TEST RESULTS:  ?The [facility] CustomNext Panel found no pathogenic mutations.  ? ?The CustomNext-Cancer+RNAinsight panel offered by [facility] includes sequencing and rearrangement analysis for the following 47 genes:  APC, ATM, AXIN2, BARD1, BMPR1A, BRCA1, BRCA2, BRIP1, CDH1, CDK4, CDKN2A, CHEK2, CTNNA1, DICER1, EPCAM, GREM1, HOXB13, KIT, MEN1, MLH1, MSH2, MSH3, MSH6, MUTYH, NBN, NF1, NTHL1, PALB2, PDGFRA, PMS2, POLD1, POLE, PTEN, RAD50, RAD51C, RAD51D, SDHA, SDHB, SDHC, SDHD, SMAD4, SMARCA4, STK11, TP53, TSC1, TSC2, and VHL.  RNA data is routinely analyzed for use in variant interpretation for all genes. ? ?The test report has been scanned into EPIC and is located under the Molecular Pathology section of the Results Review tab.  A portion of the result report is included below for reference. Genetic testing reported out on 03/28/2021.  ? ? ? ? ? ? ? ?Even though a pathogenic variant was not identified, possible explanations for her personal history of cancer may include: ?There may be no hereditary risk for cancer in the family. Her personal history of cancer may be due to other genetic or environmental factors. ?There may be a gene mutation in one of these genes that current testing methods cannot detect, but that chance is small. ?There could be another gene that has not yet been discovered, or that we have not yet tested, that is responsible for the cancer diagnoses in the family.  ? ?Therefore, it is important to remain in touch with cancer genetics in the future so that we can continue to offer Ms. Ozanich the most up to date genetic testing.  ? ?ADDITIONAL GENETIC TESTING:  ?We  discussed with Ms. Degraffenreid that her genetic testing was fairly extensive.  If there are genes identified to increase cancer risk that can be analyzed in the future, we would be happy to discuss and coordinate this testing at that time.   ? ?CANCER SCREENING RECOMMENDATIONS:  ?Ms. Vath's test result is considered  negative (normal).  This means that we have not identified a hereditary cause for her personal history of cancer at this time. Most cancers happen by chance and this negative test suggests that her cancer may fall into this category.   ? ?An individual's cancer risk and medical management are not determined by genetic test results alone. Overall cancer risk assessment incorporates additional factors, including personal medical history, family history, and any available genetic information that may result in a personalized plan for cancer prevention and surveillance. Therefore, it is recommended she continue to follow the cancer management and screening guidelines provided by her oncology and primary healthcare provider. ? ?RECOMMENDATIONS FOR FAMILY MEMBERS:   ?Since she did not inherit a mutation in a cancer predisposition gene included on this panel, her children could not have inherited a mutation from her in one of these genes. ? ?FOLLOW-UP:  ?Cancer genetics is a rapidly advancing field and it is possible that new genetic tests will be appropriate for her and/or her family members in the future. We encouraged her to remain in contact with cancer genetics on an annual basis so we can update her personal and family histories and let her know of advances in cancer genetics that may benefit this family.  ? ?Our contact number was provided. Ms. Doyel questions were answered to her satisfaction, and she knows she is welcome to call us at anytime with additional questions or concerns.  ? ?Lucille Passy, MS, [facility] ?Genetic Counselor ?[email or web address] ?(P) [phone] ? ? ?

## 2021-04-05 NOTE — Telephone Encounter (Signed)
Error

## 2021-04-05 NOTE — Progress Notes (Signed)
Patient voiced understanding of new arrival time of 1510, Clear liquids until 1410.  ?

## 2021-04-06 ENCOUNTER — Ambulatory Visit (HOSPITAL_COMMUNITY): Payer: No Typology Code available for payment source

## 2021-04-06 ENCOUNTER — Other Ambulatory Visit: Payer: Self-pay

## 2021-04-06 ENCOUNTER — Encounter (HOSPITAL_COMMUNITY)
Admission: RE | Disposition: A | Discharge status: Home / Self Care | Payer: Self-pay | Source: Home / Self Care | Attending: General Surgery

## 2021-04-06 ENCOUNTER — Ambulatory Visit (HOSPITAL_BASED_OUTPATIENT_CLINIC_OR_DEPARTMENT_OTHER): Payer: No Typology Code available for payment source | Admitting: Anesthesiology

## 2021-04-06 ENCOUNTER — Ambulatory Visit (HOSPITAL_COMMUNITY): Payer: No Typology Code available for payment source | Admitting: Anesthesiology

## 2021-04-06 ENCOUNTER — Encounter: Payer: Self-pay | Admitting: *Deleted

## 2021-04-06 ENCOUNTER — Encounter (HOSPITAL_COMMUNITY): Payer: Self-pay | Admitting: General Surgery

## 2021-04-06 ENCOUNTER — Encounter: Payer: Self-pay | Admitting: Emergency Medicine

## 2021-04-06 ENCOUNTER — Ambulatory Visit (HOSPITAL_COMMUNITY)
Admission: RE | Admit: 2021-04-06 | Discharge: 2021-04-06 | Disposition: A | Discharge status: Home / Self Care | Payer: No Typology Code available for payment source | Attending: General Surgery | Admitting: General Surgery

## 2021-04-06 DIAGNOSIS — Z170 Estrogen receptor positive status [ER+]: Secondary | ICD-10-CM

## 2021-04-06 DIAGNOSIS — C50911 Malignant neoplasm of unspecified site of right female breast: Secondary | ICD-10-CM | POA: Diagnosis not present

## 2021-04-06 DIAGNOSIS — C50411 Malignant neoplasm of upper-outer quadrant of right female breast: Secondary | ICD-10-CM

## 2021-04-06 DIAGNOSIS — Z95828 Presence of other vascular implants and grafts: Secondary | ICD-10-CM

## 2021-04-06 DIAGNOSIS — I4891 Unspecified atrial fibrillation: Secondary | ICD-10-CM | POA: Insufficient documentation

## 2021-04-06 DIAGNOSIS — Z452 Encounter for adjustment and management of vascular access device: Secondary | ICD-10-CM | POA: Diagnosis present

## 2021-04-06 IMAGING — DX DG CHEST 1V PORT
1 series · 1 of 1 positions shown · non-contrast
Comparison: Chest radiograph dated [DATE].

CLINICAL DATA: Port-A-Cath placement

EXAM:
PORTABLE CHEST 1 VIEW

[chest ap]
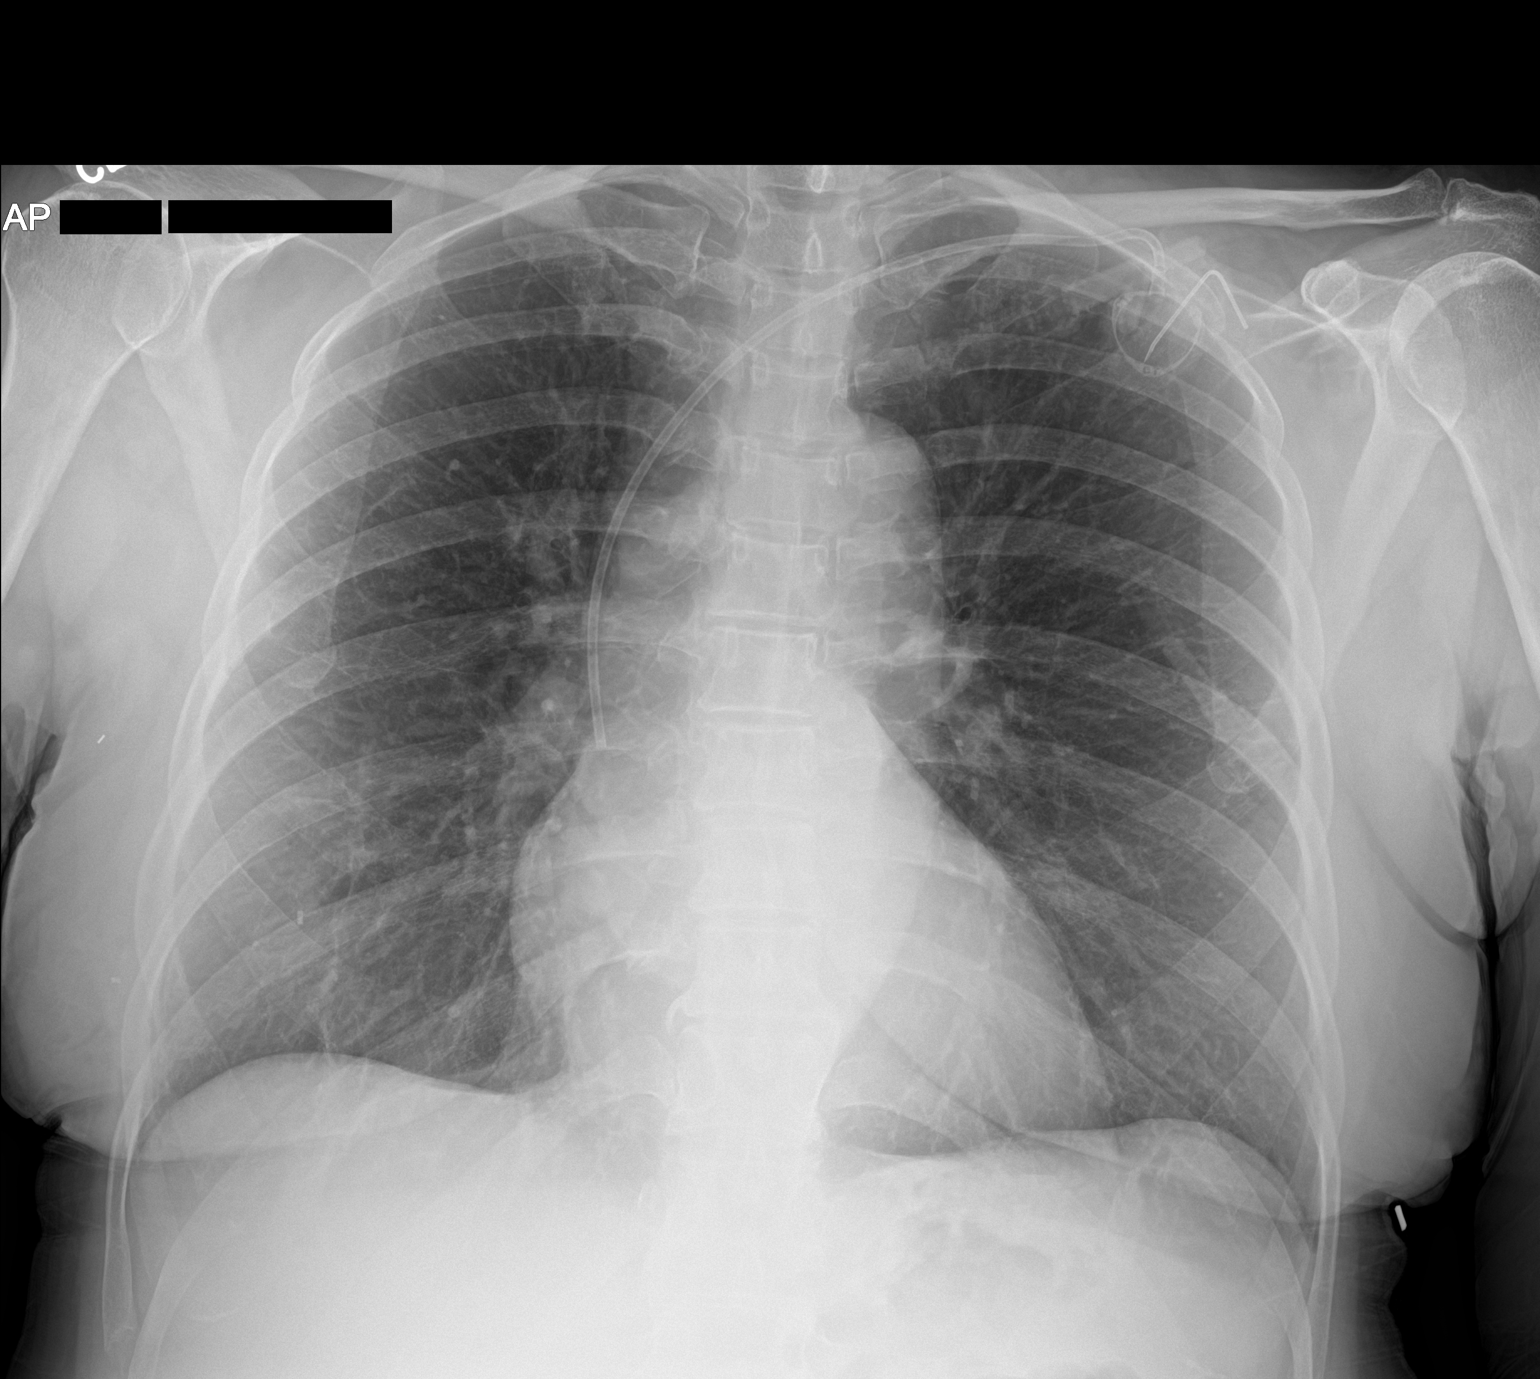

[1 of 1 positions shown; findings below may reference images not displayed]

FINDINGS: Left-sided Port-A-Cath with tip at the cavoatrial junction. No focal
consolidation, pleural effusion, or pneumothorax. The cardiac
silhouette is within normal limits. No acute osseous pathology.
IMPRESSION: Port-A-Cath with tip at the cavoatrial junction.

## 2021-04-06 SURGERY — INSERTION, TUNNELED CENTRAL VENOUS DEVICE, WITH PORT
Anesthesia: General | Site: Neck

## 2021-04-06 MED ORDER — CEFAZOLIN SODIUM-DEXTROSE 2-4 GM/100ML-% IV SOLN
2.0000 g | INTRAVENOUS | Status: AC
  Administered 2021-04-06: 2 g via INTRAVENOUS

## 2021-04-06 MED ORDER — HEPARIN 6000 UNIT IRRIGATION SOLUTION
Status: DC | PRN
  Administered 2021-04-06: 1

## 2021-04-06 MED ORDER — FENTANYL CITRATE (PF) 250 MCG/5ML IJ SOLN
INTRAMUSCULAR | Status: AC
  Filled 2021-04-06: qty 5

## 2021-04-06 MED ORDER — BUPIVACAINE-EPINEPHRINE (PF) 0.25% -1:200000 IJ SOLN
INTRAMUSCULAR | Status: AC
  Filled 2021-04-06: qty 30

## 2021-04-06 MED ORDER — MIDAZOLAM HCL 2 MG/2ML IJ SOLN
INTRAMUSCULAR | Status: AC
  Filled 2021-04-06: qty 2

## 2021-04-06 MED ORDER — HEPARIN SOD (PORK) LOCK FLUSH 100 UNIT/ML IV SOLN
INTRAVENOUS | Status: DC | PRN
  Administered 2021-04-06: 500 [IU]

## 2021-04-06 MED ORDER — ACETAMINOPHEN 500 MG PO TABS
ORAL_TABLET | ORAL | Status: AC
  Administered 2021-04-06: 1000 mg
  Filled 2021-04-06: qty 2

## 2021-04-06 MED ORDER — LIDOCAINE 2% (20 MG/ML) 5 ML SYRINGE
INTRAMUSCULAR | Status: DC | PRN
  Administered 2021-04-06: 60 mg via INTRAVENOUS

## 2021-04-06 MED ORDER — HEPARIN 6000 UNIT IRRIGATION SOLUTION
Status: AC
  Filled 2021-04-06: qty 500

## 2021-04-06 MED ORDER — 0.9 % SODIUM CHLORIDE (POUR BTL) OPTIME
TOPICAL | Status: DC | PRN
  Administered 2021-04-06: 1000 mL

## 2021-04-06 MED ORDER — LACTATED RINGERS IV SOLN: INTRAVENOUS | Status: DC | PRN

## 2021-04-06 MED ORDER — LIDOCAINE 2% (20 MG/ML) 5 ML SYRINGE
INTRAMUSCULAR | Status: AC
  Filled 2021-04-06: qty 5

## 2021-04-06 MED ORDER — PROPOFOL 10 MG/ML IV BOLUS
INTRAVENOUS | Status: DC | PRN
  Administered 2021-04-06: 100 mg via INTRAVENOUS
  Administered 2021-04-06: 150 mg via INTRAVENOUS
  Administered 2021-04-06 (×2): 50 mg via INTRAVENOUS

## 2021-04-06 MED ORDER — CEFAZOLIN SODIUM-DEXTROSE 2-4 GM/100ML-% IV SOLN
INTRAVENOUS | Status: AC
  Filled 2021-04-06: qty 100

## 2021-04-06 MED ORDER — DIPHENHYDRAMINE HCL 50 MG/ML IJ SOLN
INTRAMUSCULAR | Status: DC | PRN
Start: 2021-04-06 — End: 2021-04-06
  Administered 2021-04-06: 25 mg via INTRAVENOUS

## 2021-04-06 MED ORDER — SCOPOLAMINE 1 MG/3DAYS TD PT72: 1.0000 | MEDICATED_PATCH | TRANSDERMAL | Status: DC

## 2021-04-06 MED ORDER — SCOPOLAMINE 1 MG/3DAYS TD PT72
MEDICATED_PATCH | TRANSDERMAL | Status: AC
  Administered 2021-04-06: 1.5 mg via TRANSDERMAL
  Filled 2021-04-06: qty 1

## 2021-04-06 MED ORDER — MIDAZOLAM HCL 5 MG/5ML IJ SOLN
INTRAMUSCULAR | Status: DC | PRN
  Administered 2021-04-06 (×2): 2 mg via INTRAVENOUS

## 2021-04-06 MED ORDER — DEXAMETHASONE SODIUM PHOSPHATE 10 MG/ML IJ SOLN
INTRAMUSCULAR | Status: AC
  Filled 2021-04-06: qty 1

## 2021-04-06 MED ORDER — PROPOFOL 500 MG/50ML IV EMUL
INTRAVENOUS | Status: DC | PRN
  Administered 2021-04-06: 150 ug/kg/min via INTRAVENOUS

## 2021-04-06 MED ORDER — DEXAMETHASONE SODIUM PHOSPHATE 10 MG/ML IJ SOLN
INTRAMUSCULAR | Status: DC | PRN
  Administered 2021-04-06: 10 mg via INTRAVENOUS

## 2021-04-06 MED ORDER — LIDOCAINE HCL 1 % IJ SOLN
INTRAMUSCULAR | Status: DC | PRN
  Administered 2021-04-06: 10 mL

## 2021-04-06 MED ORDER — ONDANSETRON HCL 4 MG/2ML IJ SOLN
INTRAMUSCULAR | Status: DC | PRN
  Administered 2021-04-06: 8 mg via INTRAVENOUS

## 2021-04-06 MED ORDER — OXYCODONE HCL 5 MG PO TABS: 5.0000 mg | ORAL_TABLET | Freq: Four times a day (QID) | ORAL | 0 refills | Status: DC | PRN

## 2021-04-06 MED ORDER — ROCURONIUM BROMIDE 10 MG/ML (PF) SYRINGE
PREFILLED_SYRINGE | INTRAVENOUS | Status: AC
  Filled 2021-04-06: qty 30

## 2021-04-06 MED ORDER — CHLORHEXIDINE GLUCONATE 0.12 % MT SOLN
OROMUCOSAL | Status: AC
  Administered 2021-04-06: 15 mL via OROMUCOSAL
  Filled 2021-04-06: qty 15

## 2021-04-06 MED ORDER — LACTATED RINGERS IV SOLN: INTRAVENOUS | Status: DC

## 2021-04-06 MED ORDER — CHLORHEXIDINE GLUCONATE CLOTH 2 % EX PADS: 6.0000 | MEDICATED_PAD | Freq: Once | CUTANEOUS | Status: DC

## 2021-04-06 MED ORDER — CHLORHEXIDINE GLUCONATE 0.12 % MT SOLN: 15.0000 mL | Freq: Once | OROMUCOSAL | Status: AC

## 2021-04-06 MED ORDER — ONDANSETRON HCL 4 MG/2ML IJ SOLN
INTRAMUSCULAR | Status: AC
  Filled 2021-04-06: qty 2

## 2021-04-06 MED ORDER — HEPARIN SOD (PORK) LOCK FLUSH 100 UNIT/ML IV SOLN
INTRAVENOUS | Status: AC
  Filled 2021-04-06: qty 5

## 2021-04-06 MED ORDER — ORAL CARE MOUTH RINSE: 15.0000 mL | Freq: Once | OROMUCOSAL | Status: AC

## 2021-04-06 MED ORDER — ACETAMINOPHEN 500 MG PO TABS: 1000.0000 mg | ORAL_TABLET | ORAL | Status: DC

## 2021-04-06 MED ORDER — PROPOFOL 10 MG/ML IV BOLUS
INTRAVENOUS | Status: AC
  Filled 2021-04-06: qty 20

## 2021-04-06 MED ORDER — PROPOFOL 1000 MG/100ML IV EMUL
INTRAVENOUS | Status: AC
  Filled 2021-04-06: qty 100

## 2021-04-06 MED ORDER — LIDOCAINE HCL 1 % IJ SOLN
INTRAMUSCULAR | Status: AC
  Filled 2021-04-06: qty 20

## 2021-04-06 MED ORDER — FENTANYL CITRATE (PF) 250 MCG/5ML IJ SOLN
INTRAMUSCULAR | Status: DC | PRN
  Administered 2021-04-06: 50 ug via INTRAVENOUS
  Administered 2021-04-06: 100 ug via INTRAVENOUS
  Administered 2021-04-06 (×2): 50 ug via INTRAVENOUS

## 2021-04-06 SURGICAL SUPPLY — 46 items
ADH SKN CLS APL DERMABOND .7 (GAUZE/BANDAGES/DRESSINGS) ×1
APL PRP STRL LF DISP 70% ISPRP (MISCELLANEOUS) ×1
BAG COUNTER SPONGE SURGICOUNT (BAG) ×3 IMPLANT
BAG DECANTER FOR FLEXI CONT (MISCELLANEOUS) ×3 IMPLANT
BAG SPNG CNTER NS LX DISP (BAG) ×1
CANISTER SUCT 3000ML PPV (MISCELLANEOUS) ×1 IMPLANT
CHLORAPREP W/TINT 26 (MISCELLANEOUS) ×3 IMPLANT
COVER SURGICAL LIGHT HANDLE (MISCELLANEOUS) ×3 IMPLANT
COVER TRANSDUCER ULTRASND GEL (DISPOSABLE) ×1 IMPLANT
DECANTER SPIKE VIAL GLASS SM (MISCELLANEOUS) ×6 IMPLANT
DERMABOND ADVANCED (GAUZE/BANDAGES/DRESSINGS) ×1
DERMABOND ADVANCED .7 DNX12 (GAUZE/BANDAGES/DRESSINGS) ×2 IMPLANT
DRAPE C-ARM 42X120 X-RAY (DRAPES) ×3 IMPLANT
DRAPE CHEST BREAST 15X10 FENES (DRAPES) ×3 IMPLANT
DRAPE WARM FLUID 44X44 (DRAPES) ×1 IMPLANT
DRSG TEGADERM 4X4.75 (GAUZE/BANDAGES/DRESSINGS) ×2 IMPLANT
ELECT COATED BLADE 2.86 ST (ELECTRODE) ×3 IMPLANT
ELECT REM PT RETURN 9FT ADLT (ELECTROSURGICAL) ×2
ELECTRODE REM PT RTRN 9FT ADLT (ELECTROSURGICAL) ×2 IMPLANT
GAUZE 4X4 16PLY ~~LOC~~+RFID DBL (SPONGE) ×3 IMPLANT
GAUZE SPONGE 4X4 12PLY STRL (GAUZE/BANDAGES/DRESSINGS) ×1 IMPLANT
GEL ULTRASOUND 20GR AQUASONIC (MISCELLANEOUS) ×1 IMPLANT
GLOVE SURG ENC MOIS LTX SZ6 (GLOVE) ×3 IMPLANT
GLOVE SURG UNDER LTX SZ6.5 (GLOVE) ×3 IMPLANT
GOWN STRL REUS W/ TWL LRG LVL3 (GOWN DISPOSABLE) ×2 IMPLANT
GOWN STRL REUS W/TWL 2XL LVL3 (GOWN DISPOSABLE) ×3 IMPLANT
GOWN STRL REUS W/TWL LRG LVL3 (GOWN DISPOSABLE) ×2
IV CONNECTOR ONE LINK NDLESS (IV SETS) ×2 IMPLANT
KIT BASIN OR (CUSTOM PROCEDURE TRAY) ×3 IMPLANT
KIT PORT POWER 8FR ISP CVUE (Port) ×1 IMPLANT
KIT TURNOVER KIT B (KITS) ×3 IMPLANT
NEEDLE 22X1 1/2 (OR ONLY) (NEEDLE) ×3 IMPLANT
NS IRRIG 1000ML POUR BTL (IV SOLUTION) ×3 IMPLANT
PAD ARMBOARD 7.5X6 YLW CONV (MISCELLANEOUS) ×3 IMPLANT
PENCIL BUTTON HOLSTER BLD 10FT (ELECTRODE) ×3 IMPLANT
POSITIONER HEAD DONUT 9IN (MISCELLANEOUS) ×3 IMPLANT
SUT MON AB 4-0 PC3 18 (SUTURE) ×3 IMPLANT
SUT PROLENE 2 0 SH DA (SUTURE) ×6 IMPLANT
SUT VIC AB 3-0 SH 27 (SUTURE) ×2
SUT VIC AB 3-0 SH 27X BRD (SUTURE) ×2 IMPLANT
SYR 5ML LUER SLIP (SYRINGE) ×4 IMPLANT
TOWEL GREEN STERILE (TOWEL DISPOSABLE) ×3 IMPLANT
TOWEL GREEN STERILE FF (TOWEL DISPOSABLE) ×3 IMPLANT
TRAY LAPAROSCOPIC MC (CUSTOM PROCEDURE TRAY) ×3 IMPLANT
TUBE CONNECTING 12X1/4 (SUCTIONS) ×1 IMPLANT
YANKAUER SUCT BULB TIP NO VENT (SUCTIONS) ×1 IMPLANT

## 2021-04-06 NOTE — Research (Signed)
[employer] 2021-05 - Specimen Collection Study to Evaluate Biomarkers in Subjects with Cancer  ? ?04/06/21 ? ?Patient was informed that she is no longer eligible to participate in this study as the blood sample cannot be drawn within 3 days of a core needle biopsy, or after the start of treatment.  Patient had a core needle biopsy performed on Monday 04/05/2021 and starts treatment tomorrow. ? ?Patient verbalized understanding and denied questions. ? ?Clabe Seal ?Clinical Research Coordinator I  ?04/06/21 12:59 PM ? ?

## 2021-04-06 NOTE — Discharge Instructions (Signed)
[facility] ?Office Phone Number [phone] ? ? POST OP INSTRUCTIONS ? ?Always review your discharge instruction sheet given to you by the facility where your surgery was performed. ? ?IF YOU HAVE DISABILITY OR FAMILY LEAVE FORMS, YOU MUST BRING THEM TO THE OFFICE FOR PROCESSING.  DO NOT GIVE THEM TO YOUR DOCTOR. ? ?A prescription for pain medication may be given to you upon discharge.  Take your pain medication as prescribed, if needed.  If narcotic pain medicine is not needed, then you may take acetaminophen (Tylenol) or ibuprofen (Advil) as needed. ?Take your usually prescribed medications unless otherwise directed ?If you need a refill on your pain medication, please contact your pharmacy.  They will contact our office to request authorization.  Prescriptions will not be filled after 5pm or on week-ends. ?You should eat very light the first 24 hours after surgery, such as soup, crackers, pudding, etc.  Resume your normal diet the day after surgery ?It is common to experience some constipation if taking pain medication after surgery.  Increasing fluid intake and taking a stool softener will usually help or prevent this problem from occurring.  A mild laxative (Milk of Magnesia or Miralax) should be taken according to package directions if there are no bowel movements after 48 hours. ?You may shower in 48 hours.  The surgical glue will flake off in 2-3 weeks.   ?ACTIVITIES:  No strenuous activity or heavy lifting for 1 week.   ?You may drive when you no longer are taking prescription pain medication, you can comfortably wear a seatbelt, and you can safely maneuver your car and apply brakes. ?RETURN TO WORK:  _________to be determined_______________ ?You should see your doctor in the office for a follow-up appointment approximately three-four weeks after your surgery.   ? ?WHEN TO CALL YOUR DOCTOR: ?Fever over 101.0 ?Nausea and/or vomiting. ?Extreme swelling or bruising. ?Continued bleeding from  incision. ?Increased pain, redness, or drainage from the incision. ? ?The clinic staff is available to answer your questions during regular business hours.  Please don?t hesitate to call and ask to speak to one of the nurses for clinical concerns.  If you have a medical emergency, go to the nearest emergency room or call 911.  A surgeon from [facility] is always on call at the hospital. ? ?For further questions, please visit [email or web address]  ? ?

## 2021-04-06 NOTE — Progress Notes (Signed)
Patients port a cath was left accessed for chemo in the am ?

## 2021-04-06 NOTE — Anesthesia Postprocedure Evaluation (Signed)
Anesthesia Post Note ? ?Patient: [name] ? ?Procedure(s) Performed: INSERTION PORT-A-CATH (Neck) ? ?  ? ?Patient location during evaluation: PACU ?Anesthesia Type: General ?Level of consciousness: awake and alert ?Pain management: pain level controlled ?Vital Signs Assessment: post-procedure vital signs reviewed and stable ?Respiratory status: spontaneous breathing, nonlabored ventilation, respiratory function stable and patient connected to nasal cannula oxygen ?Cardiovascular status: blood pressure returned to baseline and stable ?Postop Assessment: no apparent nausea or vomiting ?Anesthetic complications: no ? ? ?No notable events documented. ? ?Last Vitals:  ?Vitals:  ? 04/06/21 1905 04/06/21 1920  ?BP: (!) 153/99 (!) 157/97  ?Pulse: 70 62  ?Resp: 16 16  ?Temp:  36.9 ?C  ?SpO2: 99% 99%  ?  ?Last Pain:  ?Vitals:  ? 04/06/21 1905  ?TempSrc:   ?PainSc: 0-No pain  ? ? ?  ?  ?  ?  ?  ?  ? ?March Rummage Eldene Plocher ? ? ? ? ?

## 2021-04-06 NOTE — Interval H&P Note (Signed)
History and Physical Interval Note: ? ?04/06/2021 ?3:20 PM ? ?[name] [name]  has presented today for surgery, with the diagnosis of RIGHT BREAST CANCER.  The various methods of treatment have been discussed with the patient and family. After consideration of risks, benefits and other options for treatment, the patient has consented to  Procedure(s): ?INSERTION PORT-A-CATH (N/A) as a surgical intervention.  [name] history has been reviewed, patient examined, no change in status, stable for surgery.  I have reviewed the patient's chart and labs.  Questions were answered to the patient's satisfaction.   ? ? ?Stark Klein ? ? ?

## 2021-04-06 NOTE — Anesthesia Procedure Notes (Signed)
Procedure Name: LMA Insertion ?Date/Time: 04/06/2021 5:45 PM ?Performed by: Minerva Ends, CRNA ?Pre-anesthesia Checklist: Patient identified, Emergency Drugs available, Suction available and Patient being monitored ?Patient Re-evaluated:Patient Re-evaluated prior to induction ?Oxygen Delivery Method: Circle system utilized ?Preoxygenation: Pre-oxygenation with 100% oxygen ?Induction Type: IV induction ?Ventilation: Mask ventilation without difficulty ?LMA: LMA inserted ?LMA Size: 3.0 ?Tube type: Oral ?Number of attempts: 1 ?Placement Confirmation: positive ETCO2 and breath sounds checked- equal and bilateral ?Tube secured with: Tape ?Dental Injury: Teeth and Oropharynx as per pre-operative assessment  ? ? ? ? ?

## 2021-04-06 NOTE — Research (Addendum)
[id number] - TREATMENT OF REFRACTORY NAUSEA ? ?04/06/21 ? ?Eligibility Review:  ?This Coordinator has reviewed this patient's inclusion and exclusion criteria and confirmed Nadiah Corbit is eligible for study participation.  Patient will continue with enrollment. ? ?Menopausal status (women only): Yanice Maqueda is post menopausal. ? ?Registration:  ?The patient was registered to this study today with study ID# [id number]. ? ?Plan:  ?Will collect blood specimen tomorrow morning prior to infusion.  Patient is aware to complete questionnaires online via link emailed to her, or on the paper versions provided, prior to her visit tomorrow.  Will review instructions for next steps of this study with the patient tomorrow. ? ?Patient verbalized understanding and denied questions at this time. ? ? ?Clabe Seal ?Clinical Research Coordinator I  ?04/06/21 12:15 PM  ? ?Addendum 04/12/2021: addended to request co signature by physician. ?

## 2021-04-06 NOTE — Transfer of Care (Signed)
Immediate Anesthesia Transfer of Care Note ? ?Patient: [name] [name] ? ?Procedure(s) Performed: INSERTION PORT-A-CATH (Neck) ? ?Patient Location: PACU ? ?Anesthesia Type:General ? ?Level of Consciousness: awake, alert  and oriented ? ?Airway & Oxygen Therapy: Patient Spontanous Breathing ? ?Post-op Assessment: Report given to RN and Post -op Vital signs reviewed and stable ? ?Post vital signs: Reviewed and stable ? ?Last Vitals:  ?Vitals Value Taken Time  ?BP 158/88 04/06/21 1834  ?Temp    ?Pulse 90 04/06/21 1834  ?Resp 14 04/06/21 1834  ?SpO2 100 % 04/06/21 1834  ?Vitals shown include unvalidated device data. ? ?Last Pain:  ?Vitals:  ? 04/06/21 1411  ?TempSrc: Oral  ?   ? ?  ? ?Complications: No notable events documented. ?

## 2021-04-06 NOTE — Research (Signed)
[id number] - TREATMENT OF REFRACTORY NAUSEA ? ?This Nurse has reviewed this patient's inclusion and exclusion criteria as a second review and confirms Calley Drenning is eligible for study participation.  Patient may continue with enrollment. ? ?Marjie Skiff. Melinda Pottinger, RN, BSN, CHPN ?[facility] ?Clinical Research Nurse ?[place] ?Direct Dial [phone]  Pager [phone] ?04/06/2021 12:20 PM ?

## 2021-04-06 NOTE — Op Note (Signed)
PREOPERATIVE DIAGNOSIS:  right breast cancer ?   ? POSTOPERATIVE DIAGNOSIS:  Same ?   ? PROCEDURE: Left subclavian port placement, Bard ClearVue Power Port, MRI safe, 8-French.  ?   ? SURGEON:  Stark Klein, MD  ?   ? ANESTHESIA:  General ? ? FINDINGS:  Good venous return, easy flush, and tip of the catheter and  ? SVC 23 cm.  ?   ? SPECIMEN:  None.  ?   ? ESTIMATED BLOOD LOSS:  Minimal.  ?   ? COMPLICATIONS:  None known.  ?   ? PROCEDURE:  Pt was identified in the holding area and taken to  ? the operating room, where patient was placed supine on the operating room  ? table.  General anesthesia was induced.  Patient's arms were tucked and the upper  ? chest and neck were prepped and draped in sterile fashion.  Time-out was  ? performed according to the surgical safety check list.  When all was  ? correct, we continued.   Local anesthetic was administered over this  ? area at the angle of the clavicle.  The vein was accessed with 1 pass(es) of the needle. There was good venous return and the wire passed easily with no ectopy.  ? Fluoroscopy was used to confirm that the wire was in the vena cava.  ?   ? The patient was placed back level and the area for the pocket was anethetized  ? with local anesthetic.  A 3-cm transverse incision was made with a #15  ? blade.  Cautery was used to divide the subcutaneous tissues down to the  ? pectoralis muscle.  An [employer] retractor was used to elevate the skin  ? while a pocket was created on top of the pectoralis fascia.  The port  ? was placed into the pocket to confirm that it was of adequate size.  The  ? catheter was preattached to the port.  The port was then secured to the  ? pectoralis fascia with four 2-0 Prolene sutures.  These were clamped and  ? not tied down yet.   ? ?The catheter was tunneled through to the wire exit  ? site.  The catheter was placed along the wire to determine what length it should be to be in the SVC.  The catheter was cut at 23 cm.  The  tunneler sheath and dilator were passed over the wire and the dilator and wire were removed.  The catheter was advanced through the tunneler sheath and the tunneler sheath was pulled away.  Care was taken to keep the catheter in the tunneler sheath as this occurred. This was advanced and the tunneler sheath was removed.  There was good venous  ? return and easy flush of the catheter.  The Prolene sutures were tied  ? down to the pectoral fascia.  The skin was reapproximated using 3-0  ? Vicryl interrupted deep dermal sutures.   ? ?Fluoroscopy was used to re-confirm good position of the catheter.  The skin  ? was then closed using 4-0 Monocryl in a subcuticular fashion.  The port was flushed with concentrated heparin flush as well.  The wounds were then cleaned, dried, and dressed with Dermabond.  The port was left accessed.   ? ?The patient was awakened from anesthesia and taken to the PACU in stable condition.  Needle, sponge, and instrument counts were correct.  ?   ?   ?   ?   ?  Stark Klein, MD  ?   ? ? ?

## 2021-04-06 NOTE — Anesthesia Preprocedure Evaluation (Addendum)
Anesthesia Evaluation  ?Patient identified by MRN, date of birth, ID band ?Patient awake ? ? ? ?Reviewed: ?Allergy & Precautions, NPO status , Patient's Chart, lab work & pertinent test results ? ?History of Anesthesia Complications ?(+) PONV and history of anesthetic complications ? ?Airway ?Mallampati: I ? ?TM Distance: >3 FB ?Neck ROM: Full ? ? ? Dental ?no notable dental hx. ?(+) Teeth Intact, Dental Advisory Given ?  ?Pulmonary ?neg pulmonary ROS,  ?  ?Pulmonary exam normal ?breath sounds clear to auscultation ? ? ? ? ? ? Cardiovascular ?Normal cardiovascular exam+ dysrhythmias Atrial Fibrillation  ?Rhythm:Regular Rate:Normal ? ? ?  ?Neuro/Psych ?negative neurological ROS ? negative psych ROS  ? GI/Hepatic ?negative GI ROS, (+)  ?  ? substance abuse ? alcohol use,   ?Endo/Other  ?negative endocrine ROS ? Renal/GU ?negative Renal ROS  ?negative genitourinary ?  ?Musculoskeletal ?negative musculoskeletal ROS ?(+)  ? Abdominal ?  ?Peds ? Hematology ?negative hematology ROS ?(+)   ?Anesthesia Other Findings ? ? Reproductive/Obstetrics ? ?  ? ? ? ? ? ? ? ? ? ? ? ? ? ?  ?  ? ? ? ? ? ? ? ?Anesthesia Physical ?Anesthesia Plan ? ?ASA: 2 ? ?Anesthesia Plan: General  ? ?Post-op Pain Management: Tylenol PO (pre-op)*  ? ?Induction: Intravenous ? ?PONV Risk Score and Plan: 4 or greater and Ondansetron, Dexamethasone, Midazolam, TIVA and Scopolamine patch - Pre-op ? ?Airway Management Planned: LMA ? ?Additional Equipment:  ? ?Intra-op Plan:  ? ?Post-operative Plan: Extubation in OR ? ?Informed Consent: I have reviewed the patients History and Physical, chart, labs and discussed the procedure including the risks, benefits and alternatives for the proposed anesthesia with the patient or authorized representative who has indicated his/her understanding and acceptance.  ? ? ? ?Dental advisory given ? ?Plan Discussed with: CRNA ? ?Anesthesia Plan Comments:   ? ? ? ? ? ?Anesthesia Quick  Evaluation ? ?

## 2021-04-07 ENCOUNTER — Inpatient Hospital Stay: Payer: No Typology Code available for payment source

## 2021-04-07 ENCOUNTER — Other Ambulatory Visit: Payer: Self-pay

## 2021-04-07 ENCOUNTER — Encounter: Payer: Self-pay | Admitting: Emergency Medicine

## 2021-04-07 ENCOUNTER — Encounter (HOSPITAL_COMMUNITY): Payer: Self-pay | Admitting: General Surgery

## 2021-04-07 ENCOUNTER — Encounter: Payer: Self-pay | Admitting: *Deleted

## 2021-04-07 ENCOUNTER — Inpatient Hospital Stay (HOSPITAL_BASED_OUTPATIENT_CLINIC_OR_DEPARTMENT_OTHER): Payer: No Typology Code available for payment source | Admitting: Hematology and Oncology

## 2021-04-07 VITALS — BP 150/95 | HR 80 | Temp 98.4°F | Resp 16 | Wt 151.5 lb

## 2021-04-07 DIAGNOSIS — C50411 Malignant neoplasm of upper-outer quadrant of right female breast: Secondary | ICD-10-CM

## 2021-04-07 DIAGNOSIS — Z170 Estrogen receptor positive status [ER+]: Secondary | ICD-10-CM | POA: Diagnosis not present

## 2021-04-07 DIAGNOSIS — Z5111 Encounter for antineoplastic chemotherapy: Secondary | ICD-10-CM | POA: Diagnosis present

## 2021-04-07 DIAGNOSIS — R5383 Other fatigue: Secondary | ICD-10-CM | POA: Diagnosis not present

## 2021-04-07 DIAGNOSIS — Z5189 Encounter for other specified aftercare: Secondary | ICD-10-CM | POA: Diagnosis not present

## 2021-04-07 DIAGNOSIS — M898X9 Other specified disorders of bone, unspecified site: Secondary | ICD-10-CM | POA: Diagnosis not present

## 2021-04-07 DIAGNOSIS — Z5112 Encounter for antineoplastic immunotherapy: Secondary | ICD-10-CM | POA: Diagnosis not present

## 2021-04-07 DIAGNOSIS — Z95828 Presence of other vascular implants and grafts: Secondary | ICD-10-CM

## 2021-04-07 LAB — CBC WITH DIFFERENTIAL (CANCER CENTER ONLY)
Abs Immature Granulocytes: 0.02 10*3/uL (ref 0.00–0.07)
Basophils Absolute: 0 10*3/uL (ref 0.0–0.1)
Basophils Relative: 0 %
Eosinophils Absolute: 0 10*3/uL (ref 0.0–0.5)
Eosinophils Relative: 0 %
HCT: 39 % (ref 36.0–46.0)
Hemoglobin: 13.2 g/dL (ref 12.0–15.0)
Immature Granulocytes: 0 %
Lymphocytes Relative: 10 %
Lymphs Abs: 0.8 10*3/uL (ref 0.7–4.0)
MCH: 30.6 pg (ref 26.0–34.0)
MCHC: 33.8 g/dL (ref 30.0–36.0)
MCV: 90.5 fL (ref 80.0–100.0)
Monocytes Absolute: 0.2 10*3/uL (ref 0.1–1.0)
Monocytes Relative: 3 %
Neutro Abs: 6.4 10*3/uL (ref 1.7–7.7)
Neutrophils Relative %: 87 %
Platelet Count: 260 10*3/uL (ref 150–400)
RBC: 4.31 MIL/uL (ref 3.87–5.11)
RDW: 13.2 % (ref 11.5–15.5)
WBC Count: 7.4 10*3/uL (ref 4.0–10.5)
nRBC: 0 % (ref 0.0–0.2)

## 2021-04-07 LAB — CMP (CANCER CENTER ONLY)
ALT: 20 U/L (ref 0–44)
AST: 18 U/L (ref 15–41)
Albumin: 4.2 g/dL (ref 3.5–5.0)
Alkaline Phosphatase: 56 U/L (ref 38–126)
Anion gap: 10 (ref 5–15)
BUN: 14 mg/dL (ref 8–23)
CO2: 23 mmol/L (ref 22–32)
Calcium: 9.6 mg/dL (ref 8.9–10.3)
Chloride: 104 mmol/L (ref 98–111)
Creatinine: 0.86 mg/dL (ref 0.44–1.00)
GFR, Estimated: >60 mL/min (ref >60)
Glucose, Bld: 185 mg/dL — ABNORMAL HIGH (ref 70–99)
Potassium: 3.8 mmol/L (ref 3.5–5.1)
Sodium: 137 mmol/L (ref 135–145)
Total Bilirubin: 0.4 mg/dL (ref 0.3–1.2)
Total Protein: 7 g/dL (ref 6.5–8.1)

## 2021-04-07 LAB — RESEARCH LABS

## 2021-04-07 MED ORDER — SODIUM CHLORIDE 0.9 % IV SOLN
75.0000 mg/m2 | Freq: Once | INTRAVENOUS | Status: AC
  Administered 2021-04-07: 130 mg via INTRAVENOUS
  Filled 2021-04-07: qty 13

## 2021-04-07 MED ORDER — SODIUM CHLORIDE 0.9 % IV SOLN
590.0000 mg | Freq: Once | INTRAVENOUS | Status: AC
  Administered 2021-04-07: 590 mg via INTRAVENOUS
  Filled 2021-04-07: qty 59

## 2021-04-07 MED ORDER — DIPHENHYDRAMINE HCL 25 MG PO CAPS
50.0000 mg | ORAL_CAPSULE | Freq: Once | ORAL | Status: AC
  Administered 2021-04-07: 50 mg via ORAL
  Filled 2021-04-07: qty 2

## 2021-04-07 MED ORDER — SODIUM CHLORIDE 0.9 % IV SOLN: Freq: Once | INTRAVENOUS | Status: AC

## 2021-04-07 MED ORDER — SODIUM CHLORIDE 0.9 % IV SOLN
10.0000 mg | Freq: Once | INTRAVENOUS | Status: AC
  Administered 2021-04-07: 10 mg via INTRAVENOUS
  Filled 2021-04-07: qty 10

## 2021-04-07 MED ORDER — SODIUM CHLORIDE 0.9 % IV SOLN
840.0000 mg | Freq: Once | INTRAVENOUS | Status: AC
  Administered 2021-04-07: 840 mg via INTRAVENOUS
  Filled 2021-04-07: qty 28

## 2021-04-07 MED ORDER — ACETAMINOPHEN 325 MG PO TABS
650.0000 mg | ORAL_TABLET | Freq: Once | ORAL | Status: AC
  Administered 2021-04-07: 650 mg via ORAL
  Filled 2021-04-07: qty 2

## 2021-04-07 MED ORDER — PALONOSETRON HCL INJECTION 0.25 MG/5ML
0.2500 mg | Freq: Once | INTRAVENOUS | Status: AC
  Administered 2021-04-07: 0.25 mg via INTRAVENOUS
  Filled 2021-04-07: qty 5

## 2021-04-07 MED ORDER — TRASTUZUMAB-DKST CHEMO 150 MG IV SOLR
8.0000 mg/kg | Freq: Once | INTRAVENOUS | Status: AC
  Administered 2021-04-07: 546 mg via INTRAVENOUS
  Filled 2021-04-07: qty 26

## 2021-04-07 MED ORDER — SODIUM CHLORIDE 0.9% FLUSH
10.0000 mL | INTRAVENOUS | Status: DC | PRN
  Administered 2021-04-07: 10 mL

## 2021-04-07 MED ORDER — HEPARIN SOD (PORK) LOCK FLUSH 100 UNIT/ML IV SOLN
500.0000 [IU] | Freq: Once | INTRAVENOUS | Status: AC | PRN
  Administered 2021-04-07: 500 [IU]

## 2021-04-07 MED ORDER — SODIUM CHLORIDE 0.9% FLUSH
10.0000 mL | INTRAVENOUS | Status: DC | PRN
  Administered 2021-04-07: 10 mL via INTRAVENOUS

## 2021-04-07 MED ORDER — SODIUM CHLORIDE 0.9 % IV SOLN
150.0000 mg | Freq: Once | INTRAVENOUS | Status: AC
  Administered 2021-04-07: 150 mg via INTRAVENOUS
  Filled 2021-04-07: qty 150

## 2021-04-07 NOTE — Research (Signed)
[id number] - TREATMENT OF REFRACTORY NAUSEA ? ?04/07/21 - Baseline/Cycle 1 Day 1 ? ?This patient presents to the clinic for lab and cycle 1 day 1 infusion. ? ?Baseline Questionnaires:  ?Patient had completed baseline questionnaires on paper prior to arrival at the clinic this morning.  ? ?Research Labs:  ?Research labs were drawn at 08:10am from the patient's port. ? ?Cycle 1 Questionnaires:  ?The patient was provided four day home record and paper version of the cycle 1 questionnaires.  Patient was instructed how to fill out these forms.  She was instructed to only fill out the paper versions of the cycle 1 forms if she is not able to complete via link emailed to her. ? ?The patient verbalized understanding of instructions and denied questions at this time.  She was advised to call with any questions. ? ?Clabe Seal ?Clinical Research Coordinator I  ?04/07/21 9:59 AM ?

## 2021-04-07 NOTE — Assessment & Plan Note (Signed)
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  We recommended neoadjuvant chemotherapy with TCHP and she is here for cycle 1 day 1 of TCHP. ? ?We have previously discussed about schedule for TCHP, adverse effects with TCHP including but not limited to fatigue, nausea, vomiting, diarrhea, increased risk of infections, neuropathy, alopecia, cardiotoxicity with Herceptin.  She understands that some of the treatment side effects can be severe and permanent. ?Baseline echocardiogram satisfactory.  Baseline labs reviewed and adequate for treatment ?Okay to proceed with cycle 1 day 1 as planned.  She will return to clinic in 1 week for an interim visit with Mendel Ryder our nurse practitioner.  She will return to clinic for follow-up with me before cycle 2-day 1 ? ?

## 2021-04-07 NOTE — Progress Notes (Signed)
[facility] ?CONSULT NOTE ? ?Patient Care Team: ?[name], MD as PCP - General (Internal Medicine) ?Berniece Salines, DO as PCP - Cardiology (Cardiology) ?Stark Klein, MD as Consulting Physician (General Surgery) ?Benay Pike, MD as Consulting Physician (Hematology and Oncology) ?Kyung Rudd, MD as Consulting Physician (Radiation Oncology) ?Rockwell Germany, RN as Oncology Nurse Navigator ?Mauro Kaufmann, RN as Oncology Nurse Navigator ? ?CHIEF COMPLAINTS/PURPOSE OF CONSULTATION:  ?Newly diagnosed breast cancer ? ?HISTORY OF PRESENTING ILLNESS:  ?[name] [name] 61 y.o. female is here because of recent diagnosis of right sided IDC. ? ?Screening mammogram: There is an irregular mass with associated architectural distortion ?and punctate calcifications in the lateral right breast, corresponding to the palpable abnormality.  ?Single abnormal right axillary lymph node with a mildly thickened cortex measuring 4 mm. ?Ultrasound of the breast showed 2.0 cm highly suspicious mass in the right breast at 9 o'clock position, single abnormal right axillary lymph node with a mildly thickened cortex measuring 4 mm. ?Biopsy revealed grade 2/3 IDC, prognostics showed ER +100% strong intensity, PR 70% positive strong staining intensity, HER2 equivocal, FISH positive, Ki-67 of 25%. ?She was presented in the breast [place] for recommendations.  Patient arrived today with her daughter who is a Librarian, academic in orthopedics department.  Ms. Schwartzman is quite healthy at baseline, denies any major medical comorbidities except for very severe COVID infection and history of heart palpitations which were evaluated with no major abnormalities. ?She mentions history of breast implants which were removed in 2021, she had them for 19 years, had issues with encapsulation and revisions from time to time.  She also remembers an episode of very bad mastitis in the right breast while she was breast-feeding.  She has been taking  estrogen and progesterone for the past 5 years because of severe hot flashes, this was stopped about 2 ago.  She also took birth control for about 8 years in the past from 30 93-2001. 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 ?No family history of malignancies.  She does briefly remember that she has been working with insecticide/pesticide [employer] for very long time. ? ?Interval history ?Since her last visit, she enjoyed a nice vacation and [place].  She states she could eat all she wanted and drink all she wanted, had massages and had good relaxation before starting chemotherapy.  She was comfortable in the chair, had her port placed yesterday and she has another biopsy scheduled next week.  No complaints for me today.  She does not feel drowsy with any of the medications that were given she had a few questions about the supportive care medicines.  Rest of the pertinent 10 point ROS reviewed and negative ? ?I reviewed her records extensively and collaborated the history with the patient. ? ?SUMMARY OF ONCOLOGIC HISTORY: ?Oncology History  ?Malignant neoplasm of upper-outer quadrant of right breast in female, estrogen receptor positive ([facility])  ?03/16/2021 Initial Diagnosis  ? Malignant neoplasm of upper-outer quadrant of right breast in female, estrogen receptor positive ([facility]) ?  ?04/07/2021 -  Chemotherapy  ? Patient is on Treatment Plan : BREAST  Docetaxel + Carboplatin + Trastuzumab + Pertuzumab  (TCHP) q21d   ?   ? Genetic Testing  ? [facility] CustomNext Panel was Negative. Report date is 03/28/2021. ? ?The CustomNext-Cancer+RNAinsight panel offered by [facility] includes  sequencing and rearrangement analysis for the following 47 genes:  APC, ATM, AXIN2, BARD1, BMPR1A, BRCA1, BRCA2, BRIP1, CDH1, CDK4, CDKN2A, CHEK2, CTNNA1, DICER1, EPCAM, GREM1, HOXB13, KIT, MEN1,  MLH1, MSH2, MSH3, MSH6, MUTYH, NBN, NF1, NTHL1, PALB2, PDGFRA, PMS2, POLD1, POLE, PTEN, RAD50, RAD51C, RAD51D, SDHA, SDHB, SDHC, SDHD, SMAD4, SMARCA4, STK11, TP53, TSC1, TSC2, and VHL.  RNA data is routinely analyzed for use in variant interpretation for all genes. ?  ? ? ? ?MEDICAL HISTORY:  ?Past Medical History:  ?Diagnosis Date  ? Abnormal bleeding in menstrual cycle   ? Abnormal Pap smear of vagina   ? Breast cancer ([facility] [facility])   ? COVID 08/2019  ? Endometrial polyp   ? Hot flashes   ? Irregular heart beats   ? PONV (postoperative nausea and vomiting)   ? Typical atrial flutter ([facility]) 01/01/2019  ? ? ?SURGICAL HISTORY: ?Past Surgical History:  ?Procedure Laterality Date  ? BREAST BIOPSY Right 04/05/2021  ? BREAST IMPLANT REMOVAL Bilateral 2021  ? breast implants Right 2002  ? CHOLECYSTECTOMY    ? PORTACATH PLACEMENT N/A 04/06/2021  ? Procedure: INSERTION PORT-A-CATH;  Surgeon: Stark Klein, MD;  Location: [facility];  Service: General;  Laterality: N/A;  ? SHOULDER SURGERY    ? Rotator cuff repair  ? ? ?SOCIAL HISTORY: ?Social History  ? ?Socioeconomic History  ? Marital status: Married  ?  Spouse name: Not on file  ? Number of children: Not on file  ? Years of education: Not on file  ? Highest education level: Not on file  ?Occupational History  ? Not on file  ?Tobacco Use  ? Smoking status: Never  ? Smokeless tobacco: Never  ?Vaping Use  ? Vaping Use: Never used  ?Substance and Sexual Activity  ? Alcohol use: Yes  ?  Alcohol/week: 14.0 standard drinks  ?  Types: 14 Cans of beer per week  ? Drug use: Not Currently  ? Sexual activity: Not on file  ?Other Topics Concern  ? Not on file  ?Social History Narrative  ? Not on file  ? ?Social Determinants of Health  ? ?Financial Resource Strain: Not on file  ?Food Insecurity: Not on file  ?Transportation Needs: Not on file  ?Physical Activity: Not on file  ?Stress: Not on file  ?Social Connections: Not on file  ?Intimate Partner Violence: Not on file  ? ? ?FAMILY HISTORY: ?Family  History  ?Problem Relation Age of Onset  ? Heart attack Mother   ? ? ?ALLERGIES:  is allergic to bee venom. ? ?MEDICATIONS:  ?Current Outpatient Medications  ?Medication Sig Dispense Refill  ? Cholecalciferol (VITAMIN D3) 125 MCG (5000 UT) CAPS Take 10,000 Units by mouth daily.    ? dexamethasone (DECADRON) 4 MG tablet Take 2 tablets (8 mg total) by mouth 2 (two) times daily. Start the day before Taxotere. Then take daily x 3 days after chemotherapy. 30 tablet 1  ? lidocaine-prilocaine (EMLA) cream Apply to affected area once 30 g 3  ? Multiple Vitamins-Minerals (ZINC PO) Take 1 tablet by mouth daily.    ? ondansetron (ZOFRAN) 8 MG tablet Take 1 tablet (8 mg total) by mouth 2 (two) times daily as needed (Nausea or vomiting). Start on the third day after chemotherapy. 30 tablet 1  ? oxyCODONE (OXY IR/ROXICODONE) 5 MG immediate release tablet Take 1 tablet (5 mg total) by mouth every 6 (six) hours as needed for severe pain. 8 tablet 0  ? Probiotic Product (PROBIOTIC DAILY PO) Take 1 tablet by mouth  daily.     ? prochlorperazine (COMPAZINE) 10 MG tablet Take 1 tablet (10 mg total) by mouth every 6 (six) hours as needed (Nausea or vomiting). 30 tablet 1  ? ?No current facility-administered medications for this visit.  ? ?Facility-Administered Medications Ordered in Other Visits  ?Medication Dose Route Frequency Provider Last Rate Last Admin  ? CARBOplatin (PARAPLATIN) 590 mg in sodium chloride 0.9 % 250 mL chemo infusion  590 mg Intravenous Once Taylor Spilde, Arletha Pili, MD      ? DOCEtaxel (TAXOTERE) 130 mg in sodium chloride 0.9 % 250 mL chemo infusion  75 mg/m2 (Treatment Plan Recorded) Intravenous Once Ruben Pyka, Arletha Pili, MD 261 mL/hr at 04/07/21 1452 130 mg at 04/07/21 1452  ? heparin lock flush 100 unit/mL  500 Units Intracatheter Once PRN Aubrey Blackard, Arletha Pili, MD      ? sodium chloride flush (NS) 0.9 % injection 10 mL  10 mL Intracatheter PRN Benay Pike, MD      ? ? ?PHYSICAL EXAMINATION: ?ECOG PERFORMANCE STATUS: 0 -  Asymptomatic ? ?LMP 09/09/2019  ? ?V/S reviewed ? ?GENERAL:alert, no distress and comfortable ?Rest of the physical exam deferred. ? ? ?LABORATORY DATA:  ?I have reviewed the data as listed ?Lab Results  ?Com

## 2021-04-07 NOTE — Patient Instructions (Signed)
[facility]  Discharge Instructions: ?Thank you for choosing [facility] to provide your oncology and hematology care.  ? ?If you have a lab appointment with the [facility], please go directly to the [facility] and check in at the registration area. ?  ?Wear comfortable clothing and clothing appropriate for easy access to any Portacath or PICC line.  ? ?We strive to give you quality time with your provider. You may need to reschedule your appointment if you arrive late (15 or more minutes).  Arriving late affects you and other patients whose appointments are after yours.  Also, if you miss three or more appointments without notifying the office, you may be dismissed from the clinic at the provider?s discretion.    ?  ?For prescription refill requests, have your pharmacy contact our office and allow 72 hours for refills to be completed.   ? ?Today you received the following chemotherapy and/or immunotherapy agents Trastuzumab, pertuzumab, docetaxel, carboplatin ?  ?To help prevent nausea and vomiting after your treatment, we encourage you to take your nausea medication as directed. ? ?BELOW ARE SYMPTOMS THAT SHOULD BE REPORTED IMMEDIATELY: ?*FEVER GREATER THAN 100.4 F (38 ?C) OR HIGHER ?*CHILLS OR SWEATING ?*NAUSEA AND VOMITING THAT IS NOT CONTROLLED WITH YOUR NAUSEA MEDICATION ?*UNUSUAL SHORTNESS OF BREATH ?*UNUSUAL BRUISING OR BLEEDING ?*URINARY PROBLEMS (pain or burning when urinating, or frequent urination) ?*BOWEL PROBLEMS (unusual diarrhea, constipation, pain near the anus) ?TENDERNESS IN MOUTH AND THROAT WITH OR WITHOUT PRESENCE OF ULCERS (sore throat, sores in mouth, or a toothache) ?UNUSUAL RASH, SWELLING OR PAIN  ?UNUSUAL VAGINAL DISCHARGE OR ITCHING  ? ?Items with * indicate a potential emergency and should be followed up as soon as possible or go to the Emergency Department if any problems should occur. ? ?Please show the CHEMOTHERAPY ALERT CARD or  IMMUNOTHERAPY ALERT CARD at check-in to the Emergency Department and triage nurse. ? ?Should you have questions after your visit or need to cancel or reschedule your appointment, please contact [facility]  Dept: [phone]  and follow the prompts.  Office hours are 8:00 a.m. to 4:30 p.m. Monday - Friday. Please note that voicemails left after 4:00 p.m. may not be returned until the following business day.  We are closed weekends and major holidays. You have access to a nurse at all times for urgent questions. Please call the main number to the clinic Dept: [phone] and follow the prompts. ? ? ?For any non-urgent questions, you may also contact your provider using MyChart. We now offer e-Visits for anyone 58 and older to request care online for non-urgent symptoms. For details visit [email or web address]. ?  ?Also download the MyChart app! Go to the app store, search "MyChart", open the app, select [facility], and log in with your MyChart username and password. ? ?Due to Covid, a mask is required upon entering the hospital/clinic. If you do not have a mask, one will be given to you upon arrival. For doctor visits, patients may have 1 support person aged 48 or older with them. For treatment visits, patients cannot have anyone with them due to current Covid guidelines and our immunocompromised population.  ?Trastuzumab injection for infusion ?What is this medication? ? ?TRASTUZUMAB (tras TOO zoo mab) is a monoclonal antibody. It is used to treat breast cancer and stomach cancer. ?This medicine may be used for other purposes; ask your health care provider or pharmacist if you have questions. ?COMMON BRAND NAME(S):  Herceptin, Galvin Proffer, Trazimera ?What should I tell my care team before I take this medication? ?They need to know if you have any of these conditions: ?heart disease ?heart failure ?lung or breathing disease, like asthma ?an unusual or  allergic reaction to trastuzumab, benzyl alcohol, or other medications, foods, dyes, or preservatives ?pregnant or trying to get pregnant ?breast-feeding ?How should I use this medication? ?This drug is given as an infusion into a vein. It is administered in a hospital or clinic by a specially trained health care professional. ?Talk to your pediatrician regarding the use of this medicine in children. This medicine is not approved for use in children. ?Overdosage: If you think you have taken too much of this medicine contact a poison control center or emergency room at once. ?NOTE: This medicine is only for you. Do not share this medicine with others. ?What if I miss a dose? ?It is important not to miss a dose. Call your doctor or health care professional if you are unable to keep an appointment. ?What may interact with this medication? ?This medicine may interact with the following medications: ?certain types of chemotherapy, such as daunorubicin, doxorubicin, epirubicin, and idarubicin ?This list may not describe all possible interactions. Give your health care provider a list of all the medicines, herbs, non-prescription drugs, or dietary supplements you use. Also tell them if you smoke, drink alcohol, or use illegal drugs. Some items may interact with your medicine. ?What should I watch for while using this medication? ?Visit your doctor for checks on your progress. Report any side effects. Continue your course of treatment even though you feel ill unless your doctor tells you to stop. ?Call your doctor or health care professional for advice if you get a fever, chills or sore throat, or other symptoms of a cold or flu. Do not treat yourself. Try to avoid being around people who are sick. ?You may experience fever, chills and shaking during your first infusion. These effects are usually mild and can be treated with other medicines. Report any side effects during the infusion to your health care professional. Fever  and chills usually do not happen with later infusions. ?Do not become pregnant while taking this medicine or for 7 months after stopping it. Women should inform their doctor if they wish to become pregnant or think they might be pregnant. Women of child-bearing potential will need to have a negative pregnancy test before starting this medicine. There is a potential for serious side effects to an unborn child. Talk to your health care professional or pharmacist for more information. Do not breast-feed an infant while taking this medicine or for 7 months after stopping it. ?Women must use effective birth control with this medicine. ?What side effects may I notice from receiving this medication? ?Side effects that you should report to your doctor or health care professional as soon as possible: ?allergic reactions like skin rash, itching or hives, swelling of the face, lips, or tongue ?chest pain or palpitations ?cough ?dizziness ?feeling faint or lightheaded, falls ?fever ?general ill feeling or flu-like symptoms ?signs of worsening heart failure like breathing problems; swelling in your legs and feet ?unusually weak or tired ?Side effects that usually do not require medical attention (report to your doctor or health care professional if they continue or are bothersome): ?bone pain ?changes in taste ?diarrhea ?joint pain ?nausea/vomiting ?weight loss ?This list may not describe all possible side effects. Call your doctor for medical advice about side effects.  You may report side effects to FDA at 1-800-FDA-1088. ?Where should I keep my medication? ?This drug is given in a hospital or clinic and will not be stored at home. ?NOTE: This sheet is a summary. It may not cover all possible information. If you have questions about this medicine, talk to your doctor, pharmacist, or health care provider. ?? 2022 Elsevier/Gold Standard (2016-01-26 00:00:00) ? ?Pertuzumab injection ?What is this medication? ?PERTUZUMAB (per TOOZ  ue mab) is a monoclonal antibody. It is used to treat breast cancer. ?This medicine may be used for other purposes; ask your health care provider or pharmacist if you have questions. ?COMMON BRAND NAME(S): PERJET

## 2021-04-07 NOTE — Research (Signed)
DCP-001: Use of a Clinical Trial Screening Tool to Address Cancer Health Disparities in the [facility]) ? ?04/07/21 ? ?Patient [name] [name] was identified by this coordinator as a potential candidate for the above listed study.  This Clinical Research Coordinator met with Ginette Bradway, [id number], on 04/07/21 in a manner and location that ensures patient privacy to discuss participation in the above listed research study.  Patient is Unaccompanied.  A copy of the informed consent document and separate HIPAA Authorization was provided to the patient.  Patient reads, speaks, and understands [place].   ? ?Patient was provided with the business card of this Coordinator and encouraged to contact the research team with any questions.  Patient was provided the option of taking informed consent documents home to review and was encouraged to review at their convenience with their support network, including other care providers. Patient is comfortable with making a decision regarding study participation today. ? ?As outlined in the informed consent form, this Coordinator and Natalea Sutliff discussed the purpose of the research study, the investigational nature of the study, study procedures and requirements for study participation, potential risks and benefits of study participation, as well as alternatives to participation. This study is not blinded. The patient understands participation is voluntary and they may withdraw from study participation at any time.  This study does not involve randomization.  This study does not involve an investigational drug or device. This study does not involve a placebo. Patient understands enrollment is pending full eligibility review.  ? ?Confidentiality and how the patient's information will be used as part of study participation were discussed.  Patient was informed there is not reimbursement provided for their time and effort spent on trial  participation.  The patient is encouraged to discuss research study participation with their insurance provider to determine what costs they may incur as part of study participation, including research related injury.   ? ?All questions were answered to patient's satisfaction.  The informed consent and separate HIPAA Authorization was reviewed page by page.  [name] mental and emotional status is appropriate to provide informed consent, and the patient verbalizes an understanding of study participation.  Patient has agreed to participate in the above listed research study and has voluntarily signed the informed consent with protocol version date 11/14/2020, [place] active date 02/18/2021 and separate HIPAA Authorization, version 5, revised 01/09/2019  on 04/07/21 at 0936 AM.  The patient was provided with a copy of the signed informed consent form and separate HIPAA Authorization for their reference.  No study specific procedures were obtained prior to the signing of the informed consent document.  Approximately 15 minutes were spent with the patient reviewing the informed consent documents.  Patient was not requested to complete a Release of Information form. ? ?Clabe Seal ?Clinical Research Coordinator I  ?04/07/21 10:11 AM ? ?

## 2021-04-08 ENCOUNTER — Telehealth: Payer: Self-pay | Admitting: *Deleted

## 2021-04-08 NOTE — Telephone Encounter (Signed)
Spoke with patient. She states she did well with chemo, no questions or concerns. Eating and drinking fine, no nausea or vomiting ?

## 2021-04-08 NOTE — Telephone Encounter (Signed)
-----   Message from Rolene Course, RN sent at 04/07/2021  5:08 PM EDT ----- ?Regarding: [place] 1st time F/U call - [facility] ?[place] 1st time F/U call - [facility].  Tolerated infusion well. ? ?

## 2021-04-09 ENCOUNTER — Inpatient Hospital Stay: Payer: No Typology Code available for payment source

## 2021-04-09 ENCOUNTER — Other Ambulatory Visit: Payer: Self-pay

## 2021-04-09 VITALS — BP 153/81 | HR 73 | Temp 98.6°F | Resp 16

## 2021-04-09 DIAGNOSIS — Z5111 Encounter for antineoplastic chemotherapy: Secondary | ICD-10-CM | POA: Diagnosis not present

## 2021-04-09 DIAGNOSIS — C50411 Malignant neoplasm of upper-outer quadrant of right female breast: Secondary | ICD-10-CM

## 2021-04-09 MED ORDER — PEGFILGRASTIM-CBQV 6 MG/0.6ML ~~LOC~~ SOSY
6.0000 mg | PREFILLED_SYRINGE | Freq: Once | SUBCUTANEOUS | Status: AC
  Administered 2021-04-09: 6 mg via SUBCUTANEOUS
  Filled 2021-04-09: qty 0.6

## 2021-04-09 NOTE — Patient Instructions (Signed)

## 2021-04-12 ENCOUNTER — Telehealth: Payer: Self-pay | Admitting: Emergency Medicine

## 2021-04-12 NOTE — Telephone Encounter (Signed)
[id number] - TREATMENT OF REFRACTORY NAUSEA ? ?04/12/21 ? ?2:15pm: Called to complete Cycle 1 Day 4 telephone call.  Patient did not answer, left voicemail requesting return call. ? ?4:14pm: Patient returned call. ? ?Patient states she has not had any nausea during the time since her chemotherapy.  Patient states she has filled out the 4 day home record and plans to bring this with her on Wednesday 3/22.  Patient has filled out [facility] antiemesis tool section 1 online, but has not yet filled out the rest of the questionnaires.  Instructed the patient how to complete these questionnaires.  Patient states she was able to access questionnaires while on the phone and she states she plans to fill these out today.   ? ?Patient reported no nausea during the study period with highest score of 1, and also no vomiting.  She reports no reportable adverse effects.  She is not eligible to be randomized for cycle 2 based on this information. ? ?Clabe Seal ?Clinical Research Coordinator I  ?04/12/21  4:20 PM ?

## 2021-04-14 ENCOUNTER — Other Ambulatory Visit: Payer: Self-pay

## 2021-04-14 ENCOUNTER — Inpatient Hospital Stay: Payer: No Typology Code available for payment source

## 2021-04-14 ENCOUNTER — Encounter: Payer: Self-pay | Admitting: Adult Health

## 2021-04-14 ENCOUNTER — Encounter: Payer: Self-pay | Admitting: Emergency Medicine

## 2021-04-14 ENCOUNTER — Inpatient Hospital Stay (HOSPITAL_BASED_OUTPATIENT_CLINIC_OR_DEPARTMENT_OTHER): Payer: No Typology Code available for payment source | Admitting: Adult Health

## 2021-04-14 VITALS — BP 140/80 | HR 95 | Temp 98.6°F | Resp 16 | Ht 62.0 in | Wt 148.8 lb

## 2021-04-14 DIAGNOSIS — C50411 Malignant neoplasm of upper-outer quadrant of right female breast: Secondary | ICD-10-CM

## 2021-04-14 DIAGNOSIS — Z170 Estrogen receptor positive status [ER+]: Secondary | ICD-10-CM

## 2021-04-14 DIAGNOSIS — Z5111 Encounter for antineoplastic chemotherapy: Secondary | ICD-10-CM | POA: Diagnosis not present

## 2021-04-14 DIAGNOSIS — Z95828 Presence of other vascular implants and grafts: Secondary | ICD-10-CM

## 2021-04-14 LAB — URINALYSIS, COMPLETE (UACMP) WITH MICROSCOPIC
Bilirubin Urine: NEGATIVE
Glucose, UA: NEGATIVE mg/dL
Hgb urine dipstick: NEGATIVE
Ketones, ur: 5 mg/dL — AB
Nitrite: NEGATIVE
Protein, ur: NEGATIVE mg/dL
Specific Gravity, Urine: 1.02 (ref 1.005–1.030)
WBC, UA: >50 WBC/hpf — ABNORMAL HIGH (ref 0–5)
pH: 6 (ref 5.0–8.0)

## 2021-04-14 LAB — CBC WITH DIFFERENTIAL (CANCER CENTER ONLY)
Abs Immature Granulocytes: 1.58 10*3/uL — ABNORMAL HIGH (ref 0.00–0.07)
Basophils Absolute: 0 10*3/uL (ref 0.0–0.1)
Basophils Relative: 0 %
Eosinophils Absolute: 0 10*3/uL (ref 0.0–0.5)
Eosinophils Relative: 0 %
HCT: 38.3 % (ref 36.0–46.0)
Hemoglobin: 13.2 g/dL (ref 12.0–15.0)
Immature Granulocytes: 16 %
Lymphocytes Relative: 21 %
Lymphs Abs: 2 10*3/uL (ref 0.7–4.0)
MCH: 30.9 pg (ref 26.0–34.0)
MCHC: 34.5 g/dL (ref 30.0–36.0)
MCV: 89.7 fL (ref 80.0–100.0)
Monocytes Absolute: 2 10*3/uL — ABNORMAL HIGH (ref 0.1–1.0)
Monocytes Relative: 20 %
Neutro Abs: 4.2 10*3/uL (ref 1.7–7.7)
Neutrophils Relative %: 43 %
Platelet Count: 173 10*3/uL (ref 150–400)
RBC: 4.27 MIL/uL (ref 3.87–5.11)
RDW: 13.1 % (ref 11.5–15.5)
Smear Review: NORMAL
WBC Count: 9.8 10*3/uL (ref 4.0–10.5)
nRBC: 0.3 % — ABNORMAL HIGH (ref 0.0–0.2)

## 2021-04-14 LAB — CMP (CANCER CENTER ONLY)
ALT: 142 U/L — ABNORMAL HIGH (ref 0–44)
AST: 70 U/L — ABNORMAL HIGH (ref 15–41)
Albumin: 4 g/dL (ref 3.5–5.0)
Alkaline Phosphatase: 87 U/L (ref 38–126)
Anion gap: 8 (ref 5–15)
BUN: 14 mg/dL (ref 8–23)
CO2: 26 mmol/L (ref 22–32)
Calcium: 9.5 mg/dL (ref 8.9–10.3)
Chloride: 102 mmol/L (ref 98–111)
Creatinine: 0.81 mg/dL (ref 0.44–1.00)
GFR, Estimated: >60 mL/min (ref >60)
Glucose, Bld: 101 mg/dL — ABNORMAL HIGH (ref 70–99)
Potassium: 4.4 mmol/L (ref 3.5–5.1)
Sodium: 136 mmol/L (ref 135–145)
Total Bilirubin: 0.4 mg/dL (ref 0.3–1.2)
Total Protein: 6.6 g/dL (ref 6.5–8.1)

## 2021-04-14 MED ORDER — SODIUM CHLORIDE 0.9% FLUSH
10.0000 mL | INTRAVENOUS | Status: AC | PRN
  Administered 2021-04-14: 10 mL

## 2021-04-14 MED ORDER — HEPARIN SOD (PORK) LOCK FLUSH 100 UNIT/ML IV SOLN
500.0000 [IU] | INTRAVENOUS | Status: AC | PRN
  Administered 2021-04-14: 500 [IU]

## 2021-04-14 NOTE — Research (Addendum)
[id number] - TREATMENT OF REFRACTORY NAUSEA ? ?04/14/21 ? ?Collected 4 day home record from patient while she was here for her scheduled appointments.  This was reviewed for completeness by this research coordinator. ? ?Patient also returned completed paper versions of cycle 1 PROs.  These were not reviewed or used as she has already submitted the required PROs online. ? ?Patient did not have nausea score of 3 or greater on the four day home record and therefore is not eligible to proceed to part 2 of this study. ? ?The patient was thanked for her participation in this study. ? ?Clabe Seal ?Clinical Research Coordinator I  ?04/14/21 12:47 PM ?

## 2021-04-14 NOTE — Assessment & Plan Note (Addendum)
Isley is a 61 year old woman with stage Ib triple positive breast cancer currently receiving neoadjuvant chemotherapy with Taxotere, Norma Fredrickson, Herceptin, Perjeta beginning April 07, 2021.  She is here today for toxicity check. ? ?Echo completed April 01, 2021: LVEF 60 to 65% ? ?Chemotoxicities: ?1. Fatigue/sleep disturbance: ? Secondary to dexamethasone, recommended 2 tab in am and 1 tab in afternoon on days she is to take this.  If this doesn't improve we can consider decreasing Dexamethasone to 1 tab BID or add a tail of one tab daily dex on days 4 and 5 after treatment.  ?2. Mild dehydration: will administer IV fluids on day 3 of injection ?3. Bone pain: Secondary to GCSF, recommended one extra strength Tylenol and one aleve as much as three times a day as needed.   ? ?We will see Lenay back in 2 weeks for labs, f/u and cycle 2 of chemotherapy. ?

## 2021-04-14 NOTE — Progress Notes (Signed)
[facility] Follow up: ?  ? ?[name], MD ?[address] ?[place] [id number] ? ? ?DIAGNOSIS:  Cancer Staging  ?Malignant neoplasm of upper-outer quadrant of right breast in female, estrogen receptor positive ([place]) ?Staging form: Breast, AJCC 8th Edition ?- Clinical stage from 03/17/2021: Stage IB (cT2, cN0, cM0, G2, ER+, PR+, HER2+) - Unsigned ?Stage prefix: Initial diagnosis ?Method of lymph node assessment: Clinical ?Histologic grading system: 3 grade system ? ? ?SUMMARY OF ONCOLOGIC HISTORY: ?Oncology History  ?Malignant neoplasm of upper-outer quadrant of right breast in female, estrogen receptor positive ([place])  ?03/16/2021 Initial Diagnosis  ? Malignant neoplasm of upper-outer quadrant of right breast in female, estrogen receptor positive ([place]) ?  ?04/07/2021 -  Chemotherapy  ? Patient is on Treatment Plan : BREAST  Docetaxel + Carboplatin + Trastuzumab + Pertuzumab  ([facility]) q21d   ?   ? Genetic Testing  ? [facility] CustomNext Panel was Negative. Report date is 03/28/2021. ? ?The CustomNext-Cancer+RNAinsight panel offered by [facility] includes sequencing and rearrangement analysis for the following 47 genes:  APC, ATM, AXIN2, BARD1, BMPR1A, BRCA1, BRCA2, BRIP1, CDH1, CDK4, CDKN2A, CHEK2, CTNNA1, DICER1, EPCAM, GREM1, HOXB13, KIT, MEN1, MLH1, MSH2, MSH3, MSH6, MUTYH, NBN, NF1, NTHL1, PALB2, PDGFRA, PMS2, POLD1, POLE, PTEN, RAD50, RAD51C, RAD51D, SDHA, SDHB, SDHC, SDHD, SMAD4, SMARCA4, STK11, TP53, TSC1, TSC2, and VHL.  RNA data is routinely analyzed for use in variant interpretation for all genes. ?  ? ? ?CURRENT THERAPY: [facility]  ? ?INTERVAL HISTORY: ?[name] [name] 61 y.o. female returns for evaluation after receiving cycle 1 of [facility].  Her most recent echocardiogram was completed April 01, 2021.  It showed a normal left ventricular ejection fraction of 60 to 65%.  The global longitudinal strain was normal. ? ?[name] feels moderately well today.  On "shot day" she began to  experience bone pain and increased fatigue.  She also noted an increased concentrated smell in her urine, as well as difficulty sleeping.   ? ? ?Patient Active Problem List  ? Diagnosis Date Noted  ? Genetic testing 03/31/2021  ? Malignant neoplasm of upper-outer quadrant of right breast in female, estrogen receptor positive ([place]) 03/16/2021  ? Typical atrial flutter ([place]) 01/01/2019  ? ? ?is allergic to bee venom. ? ?MEDICAL HISTORY: ?Past Medical History:  ?Diagnosis Date  ? Abnormal bleeding in menstrual cycle   ? Abnormal Pap smear of vagina   ? Breast cancer ([place])   ? COVID 08/2019  ? Endometrial polyp   ? Hot flashes   ? Irregular heart beats   ? PONV (postoperative nausea and vomiting)   ? Typical atrial flutter ([place]) 01/01/2019  ? ? ?SURGICAL HISTORY: ?Past Surgical History:  ?Procedure Laterality Date  ? BREAST BIOPSY Right 04/05/2021  ? BREAST IMPLANT REMOVAL Bilateral 2021  ? breast implants Right 2002  ? CHOLECYSTECTOMY    ? PORTACATH PLACEMENT N/A 04/06/2021  ? Procedure: INSERTION PORT-A-CATH;  Surgeon: Stark Klein, MD;  Location: [facility];  Service: General;  Laterality: N/A;  ? SHOULDER SURGERY    ? Rotator cuff repair  ? ? ?SOCIAL HISTORY: ?Social History  ? ?Socioeconomic History  ? Marital status: Married  ?  Spouse name: Not on file  ? Number of children: Not on file  ? Years of education: Not on file  ? Highest education level: Not on file  ?Occupational History  ? Not on file  ?Tobacco Use  ? Smoking status: Never  ? Smokeless tobacco: Never  ?  Vaping Use  ? Vaping Use: Never used  ?Substance and Sexual Activity  ? Alcohol use: Yes  ?  Alcohol/week: 14.0 standard drinks  ?  Types: 14 Cans of beer per week  ? Drug use: Not Currently  ? Sexual activity: Not on file  ?Other Topics Concern  ? Not on file  ?Social History Narrative  ? Not on file  ? ?Social Determinants of Health  ? ?Financial Resource Strain: Not on file  ?Food Insecurity: Not on file  ?Transportation Needs: Not on file  ?Physical  Activity: Not on file  ?Stress: Not on file  ?Social Connections: Not on file  ?Intimate Partner Violence: Not on file  ? ? ?FAMILY HISTORY: ?Family History  ?Problem Relation Age of Onset  ? Heart attack Mother   ? ? ?Review of Systems  ?Constitutional:  Positive for fatigue. Negative for appetite change, chills, fever and unexpected weight change.  ?HENT:   Negative for hearing loss, lump/mass, mouth sores and trouble swallowing.   ?Eyes:  Negative for eye problems and icterus.  ?Respiratory:  Negative for chest tightness, cough and shortness of breath.   ?Cardiovascular:  Negative for chest pain, leg swelling and palpitations.  ?Gastrointestinal:  Negative for abdominal distention, abdominal pain, constipation, diarrhea, nausea and vomiting.  ?Endocrine: Negative for hot flashes.  ?Genitourinary:  Negative for difficulty urinating.   ?Musculoskeletal:  Negative for arthralgias.  ?Skin:  Negative for itching and rash.  ?Neurological:  Negative for dizziness, extremity weakness, headaches and numbness.  ?Hematological:  Negative for adenopathy. Does not bruise/bleed easily.  ?Psychiatric/Behavioral:  Negative for depression. The patient is not nervous/anxious.    ? ? ?PHYSICAL EXAMINATION ? ?ECOG PERFORMANCE STATUS: 1 - Symptomatic but completely ambulatory ? ?Vitals:  ? 04/14/21 0855  ?BP: 140/80  ?Pulse: 95  ?Resp: 16  ?Temp: 98.6 ?F (37 ?C)  ?SpO2: 99%  ? ? ?Physical Exam ?Constitutional:   ?   General: She is not in acute distress. ?   Appearance: Normal appearance. She is not toxic-appearing.  ?HENT:  ?   Head: Normocephalic and atraumatic.  ?Eyes:  ?   General: No scleral icterus. ?Cardiovascular:  ?   Rate and Rhythm: Normal rate and regular rhythm.  ?   Pulses: Normal pulses.  ?   Heart sounds: Normal heart sounds.  ?Pulmonary:  ?   Effort: Pulmonary effort is normal.  ?   Breath sounds: Normal breath sounds.  ?Abdominal:  ?   General: Abdomen is flat. Bowel sounds are normal. There is no distension.  ?    Palpations: Abdomen is soft.  ?   Tenderness: There is no abdominal tenderness.  ?Musculoskeletal:     ?   General: No swelling.  ?   Cervical back: Neck supple.  ?Lymphadenopathy:  ?   Cervical: No cervical adenopathy.  ?Skin: ?   General: Skin is warm and dry.  ?   Findings: No rash.  ?Neurological:  ?   General: No focal deficit present.  ?   Mental Status: She is alert.  ?Psychiatric:     ?   Mood and Affect: Mood normal.     ?   Behavior: Behavior normal.  ? ? ?LABORATORY DATA: ? ?CBC ?   ?Component Value Date/Time  ? WBC 9.8 04/14/2021 0843  ? RBC 4.27 04/14/2021 0843  ? HGB 13.2 04/14/2021 0843  ? HGB 12.9 12/03/2018 1624  ? HCT 38.3 04/14/2021 0843  ? HCT 38.5 12/03/2018 1624  ? PLT 173  04/14/2021 0843  ? PLT 255 12/03/2018 1624  ? MCV 89.7 04/14/2021 0843  ? MCV 92 12/03/2018 1624  ? MCH 30.9 04/14/2021 0843  ? MCHC 34.5 04/14/2021 0843  ? RDW 13.1 04/14/2021 0843  ? RDW 12.3 12/03/2018 1624  ? LYMPHSABS 2.0 04/14/2021 0843  ? MONOABS 2.0 (H) 04/14/2021 0843  ? EOSABS 0.0 04/14/2021 0843  ? BASOSABS 0.0 04/14/2021 0843  ? ? ?CMP  ?   ?Component Value Date/Time  ? NA 136 04/14/2021 0843  ? NA 143 12/03/2018 1624  ? K 4.4 04/14/2021 0843  ? CL 102 04/14/2021 0843  ? CO2 26 04/14/2021 0843  ? GLUCOSE 101 (H) 04/14/2021 0843  ? BUN 14 04/14/2021 0843  ? BUN 17 12/03/2018 1624  ? CREATININE 0.81 04/14/2021 0843  ? CALCIUM 9.5 04/14/2021 0843  ? PROT 6.6 04/14/2021 0843  ? ALBUMIN 4.0 04/14/2021 0843  ? AST 70 (H) 04/14/2021 0843  ? ALT 142 (H) 04/14/2021 0843  ? ALKPHOS 87 04/14/2021 0843  ? BILITOT 0.4 04/14/2021 0843  ? GFRNONAA >60 04/14/2021 0843  ? GFRAA 97 12/03/2018 1624  ? ? ? ? ? ?ASSESSMENT and THERAPY PLAN:  ? ?Malignant neoplasm of upper-outer quadrant of right breast in female, estrogen receptor positive ([place]) ?Kirandeep is a 61 year old woman with stage Ib triple positive breast cancer currently receiving neoadjuvant chemotherapy with Taxotere, Norma Fredrickson, Herceptin, Perjeta beginning April 07, 2021.  She  is here today for toxicity check. ? ?Echo completed April 01, 2021: LVEF 60 to 65% ? ?Chemotoxicities: ?1. Fatigue/sleep disturbance: ? Secondary to dexamethasone, recommended 2 tab in am and 1 tab in aftern

## 2021-04-15 ENCOUNTER — Other Ambulatory Visit: Payer: Self-pay | Admitting: *Deleted

## 2021-04-15 ENCOUNTER — Telehealth: Payer: Self-pay | Admitting: Adult Health

## 2021-04-15 ENCOUNTER — Encounter: Payer: Self-pay | Admitting: *Deleted

## 2021-04-15 MED ORDER — CIPROFLOXACIN HCL 500 MG PO TABS: 500.0000 mg | ORAL_TABLET | Freq: Two times a day (BID) | ORAL | 0 refills | Status: DC

## 2021-04-15 NOTE — Telephone Encounter (Signed)
This RN sent prescription to pharmacy per need for abnormal urine.  Called pt and left message on her VM.

## 2021-04-15 NOTE — Telephone Encounter (Signed)
Scheduled appointment per 3/22 los. Left message.  ?

## 2021-04-16 ENCOUNTER — Telehealth: Payer: Self-pay | Admitting: *Deleted

## 2021-04-16 ENCOUNTER — Encounter: Payer: Self-pay | Admitting: Hematology and Oncology

## 2021-04-16 MED ORDER — CLINDAMYCIN PHOSPHATE 1 % EX GEL: Freq: Two times a day (BID) | CUTANEOUS | 1 refills | Status: DC

## 2021-04-16 NOTE — Telephone Encounter (Signed)
This RN attempted x 2 to return VM to pt stating "I have a rash on my face,neck and lips and need to know what to do for it " ? ?" I took benadryl last night " ? ?Obtained pt's VM x 2- message left informing her need to call again to hopefully speak person to person due to need for additional information due to not just 1st chemo last week but also Cipro given yesterday for UTI. ? ?Pt sent a my chart message stating the above- this RN replied with request for additional information. ? ?This RN also attempted to call the pt's husband and obtained his VM as well. ?

## 2021-04-16 NOTE — Telephone Encounter (Signed)
This RN was able to speak with the patient - she states she developed " like dry chapped lips " earlier in the week with the pustule type rash " very small pimples that have a white head " started yesterday.  ? ?She denies any breathing or swallowing issues. ? ?Per discussion with MD- prescription obtained for Clindamycin gel. ? ?Reviewed with pt. ? ?Of note per review of noted elevated liver enzyme - this RN reviewed need to monitor ETOH intake- Danity states she has not had any alcohol " since right before I had my port surgery "  ? ?This RN validated above. ? ?Pt understands to call the on call over the weekend if she has further issues. ?

## 2021-04-18 ENCOUNTER — Encounter: Payer: Self-pay | Admitting: Hematology and Oncology

## 2021-04-18 ENCOUNTER — Encounter: Payer: Self-pay | Admitting: Adult Health

## 2021-04-26 NOTE — Progress Notes (Signed)
[facility] Follow up: ?  ? ?[name], MD ?[address] ?[place] [address] ? ? ?DIAGNOSIS:  Cancer Staging  ?Malignant neoplasm of upper-outer quadrant of right breast in female, estrogen receptor positive ([place]) ?Staging form: Breast, AJCC 8th Edition ?- Clinical stage from 03/17/2021: Stage IB (cT2, cN0, cM0, G2, ER+, PR+, HER2+) - Unsigned ?Stage prefix: Initial diagnosis ?Method of lymph node assessment: Clinical ?Histologic grading system: 3 grade system ? ? ?SUMMARY OF ONCOLOGIC HISTORY: ?Oncology History  ?Malignant neoplasm of upper-outer quadrant of right breast in female, estrogen receptor positive ([place])  ?03/16/2021 Initial Diagnosis  ? Malignant neoplasm of upper-outer quadrant of right breast in female, estrogen receptor positive ([place]) ?  ?04/07/2021 -  Chemotherapy  ? Patient is on Treatment Plan : BREAST  Docetaxel + Carboplatin + Trastuzumab + Pertuzumab  (TCHP) q21d   ?   ? Genetic Testing  ? [facility] CustomNext Panel was Negative. Report date is 03/28/2021. ? ?The CustomNext-Cancer+RNAinsight panel offered by [facility] includes sequencing and rearrangement analysis for the following 47 genes:  APC, ATM, AXIN2, BARD1, BMPR1A, BRCA1, BRCA2, BRIP1, CDH1, CDK4, CDKN2A, CHEK2, CTNNA1, DICER1, EPCAM, GREM1, HOXB13, KIT, MEN1, MLH1, MSH2, MSH3, MSH6, MUTYH, NBN, NF1, NTHL1, PALB2, PDGFRA, PMS2, POLD1, POLE, PTEN, RAD50, RAD51C, RAD51D, SDHA, SDHB, SDHC, SDHD, SMAD4, SMARCA4, STK11, TP53, TSC1, TSC2, and VHL.  RNA data is routinely analyzed for use in variant interpretation for all genes. ?  ? ? ?CURRENT THERAPY: TCHP  ? ?INTERVAL HISTORY: ?[name] [name] 61 y.o. female returns for evaluation after receiving cycle 1 of TCHP.  ?  ?Baseline ECHO with EF of 60-65%. ?[name] [name] had several side effects after the last cycle of chemotherapy.  Most prominent was skin rash which she has noticed few days after her chemotherapy.  She had blistering skin lesions which  eventually peeled off and she is excited about the new skin without any blemishes right now.  She has tried the clindamycin gel which worked very well for her.  She had tried to avoid sun as best as she can. ?She also had a urinary tract infection and needed antibiotics.  She states there were 2 days of severe bladder spasms and she was very uncomfortable.  She had 2 days of explosive diarrhea which again has resolved. ?She has noticed worsening hot flashes, she has been waking up every 15 minutes during the nighttime and this does not allow her to rest at all.  She has not been drinking any alcohol as instructed.  She has been wearing a wide brimmed hat with good SPF to avoid excessive sun exposure. ?Overall she has recovered very well from the last cycle of chemotherapy but is worried if she might have any permanent toxicity from chemotherapy. ?Her hair has started falling off last weekend. ?Rest of the pertinent 10 point ROS reviewed and negative. ? ?Patient Active Problem List  ? Diagnosis Date Noted  ? Chemotherapy induced diarrhea 04/27/2021  ? Rash and nonspecific skin eruption 04/27/2021  ? UTI (urinary tract infection) 04/27/2021  ? 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 Transaminitis 04/27/2021  ? Genetic testing 03/31/2021  ? Malignant neoplasm of upper-outer quadrant of right breast in female, estrogen receptor positive ([place]) 03/16/2021  ? Typical atrial flutter ([place]) 01/01/2019  ? ? ?is allergic to bee venom. ? ?MEDICAL HISTORY: ?Past Medical History:  ?Diagnosis Date  ? Abnormal bleeding in menstrual cycle   ? Abnormal Pap smear of  vagina   ? Breast cancer ([place])   ? COVID 08/2019  ? Endometrial polyp   ? Hot flashes   ? Irregular heart beats   ? PONV (postoperative nausea and vomiting)   ? Typical atrial flutter ([place]) 01/01/2019  ? ? ?SURGICAL HISTORY: ?Past Surgical History:  ?Procedure Laterality Date  ? BREAST BIOPSY Right 04/05/2021  ? BREAST IMPLANT REMOVAL Bilateral  2021  ? breast implants Right 2002  ? CHOLECYSTECTOMY    ? PORTACATH PLACEMENT N/A 04/06/2021  ? Procedure: INSERTION PORT-A-CATH;  Surgeon: Stark Klein, MD;  Location: [facility];  Service: General;  Laterality: N/A;  ? SHOULDER SURGERY    ? Rotator cuff repair  ? ? ?SOCIAL HISTORY: ?Social History  ? ?Socioeconomic History  ? Marital status: Married  ?  Spouse name: Not on file  ? Number of children: Not on file  ? Years of education: Not on file  ? Highest education level: Not on file  ?Occupational History  ? Not on file  ?Tobacco Use  ? Smoking status: Never  ? Smokeless tobacco: Never  ?Vaping Use  ? Vaping Use: Never used  ?Substance and Sexual Activity  ? Alcohol use: Yes  ?  Alcohol/week: 14.0 standard drinks  ?  Types: 14 Cans of beer per week  ? Drug use: Not Currently  ? Sexual activity: Not on file  ?Other Topics Concern  ? Not on file  ?Social History Narrative  ? Not on file  ? ?Social Determinants of Health  ? ?Financial Resource Strain: Not on file  ?Food Insecurity: Not on file  ?Transportation Needs: Not on file  ?Physical Activity: Not on file  ?Stress: Not on file  ?Social Connections: Not on file  ?Intimate Partner Violence: Not on file  ? ? ?FAMILY HISTORY: ?Family History  ?Problem Relation Age of Onset  ? Heart attack Mother   ? ? ?Review of Systems  ?Constitutional:  Positive for fatigue. Negative for appetite change, chills, fever and unexpected weight change.  ?HENT:   Negative for hearing loss, lump/mass, mouth sores and trouble swallowing.   ?Eyes:  Negative for eye problems and icterus.  ?Respiratory:  Negative for chest tightness, cough and shortness of breath.   ?Cardiovascular:  Negative for chest pain, leg swelling and palpitations.  ?Gastrointestinal:  Positive for diarrhea (2 days of diarrhea resolved). Negative for abdominal distention, abdominal pain, constipation, nausea and vomiting.  ?Endocrine: Negative for hot flashes.  ?Genitourinary:  Positive for difficulty urinating and  dysuria (Resolved).   ?Musculoskeletal:  Positive for arthralgias (Resolved).  ?Skin:  Positive for rash. Negative for itching.  ?Neurological:  Negative for dizziness, extremity weakness, headaches and numbness.  ?Hematological:  Negative for adenopathy. Does not bruise/bleed easily.  ?Psychiatric/Behavioral:  Negative for depression. The patient is not nervous/anxious.    ? ? ?PHYSICAL EXAMINATION ? ?ECOG PERFORMANCE STATUS: 1 - Symptomatic but completely ambulatory ? ?Vitals:  ? 04/27/21 0938  ?BP: (!) 151/95  ?Pulse: 81  ?Resp: 16  ?Temp: 98.1 ?F (36.7 ?C)  ?SpO2: 100%  ? ? ?Physical Exam ?Constitutional:   ?   General: She is not in acute distress. ?   Appearance: Normal appearance. She is not toxic-appearing.  ?HENT:  ?   Head: Normocephalic and atraumatic.  ?   Comments: Face appears erythematous consistent with new skin replacement, no pustules noted today ?Eyes:  ?   General: No scleral icterus. ?Cardiovascular:  ?   Rate and Rhythm: Normal rate and regular rhythm.  ?  Pulses: Normal pulses.  ?   Heart sounds: Normal heart sounds.  ?Pulmonary:  ?   Effort: Pulmonary effort is normal.  ?   Breath sounds: Normal breath sounds.  ?Chest:  ?   Comments: Right breast mass at 9 o'clock position appears slightly smaller and more well delineated, softer on palpation.  It previously measured around 2.5 cm, today measures slightly short of 2.5 cm in the largest dimension and 1-1/2 cm in the shortest dimension. ?Abdominal:  ?   General: Abdomen is flat. Bowel sounds are normal. There is no distension.  ?   Palpations: Abdomen is soft.  ?   Tenderness: There is no abdominal tenderness.  ?Musculoskeletal:     ?   General: No swelling.  ?   Cervical back: Neck supple.  ?Lymphadenopathy:  ?   Cervical: No cervical adenopathy.  ?Skin: ?   General: Skin is warm and dry.  ?   Findings: No rash.  ?Neurological:  ?   General: No focal deficit present.  ?   Mental Status: She is alert.  ?Psychiatric:     ?   Mood and Affect:  Mood normal.     ?   Behavior: Behavior normal.  ? ? ?LABORATORY DATA: ? ?CBC ?   ?Component Value Date/Time  ? WBC 4.8 04/27/2021 0908  ? RBC 3.33 (L) 04/27/2021 0908  ? HGB 10.2 (L) 04/27/2021 0908  ? HGB 12.9 1

## 2021-04-27 ENCOUNTER — Inpatient Hospital Stay (HOSPITAL_BASED_OUTPATIENT_CLINIC_OR_DEPARTMENT_OTHER): Payer: No Typology Code available for payment source | Admitting: Hematology and Oncology

## 2021-04-27 ENCOUNTER — Other Ambulatory Visit: Payer: Self-pay

## 2021-04-27 ENCOUNTER — Inpatient Hospital Stay: Payer: No Typology Code available for payment source | Attending: Hematology and Oncology

## 2021-04-27 ENCOUNTER — Encounter: Payer: Self-pay | Admitting: Hematology and Oncology

## 2021-04-27 ENCOUNTER — Inpatient Hospital Stay: Payer: No Typology Code available for payment source

## 2021-04-27 ENCOUNTER — Telehealth: Payer: Self-pay | Admitting: *Deleted

## 2021-04-27 VITALS — BP 147/94 | HR 72 | Temp 98.1°F | Resp 18

## 2021-04-27 DIAGNOSIS — Z5111 Encounter for antineoplastic chemotherapy: Secondary | ICD-10-CM | POA: Insufficient documentation

## 2021-04-27 DIAGNOSIS — M898X9 Other specified disorders of bone, unspecified site: Secondary | ICD-10-CM | POA: Insufficient documentation

## 2021-04-27 DIAGNOSIS — Z95828 Presence of other vascular implants and grafts: Secondary | ICD-10-CM

## 2021-04-27 DIAGNOSIS — Z87440 Personal history of urinary (tract) infections: Secondary | ICD-10-CM | POA: Diagnosis not present

## 2021-04-27 DIAGNOSIS — R7401 Elevation of levels of liver transaminase levels: Secondary | ICD-10-CM | POA: Insufficient documentation

## 2021-04-27 DIAGNOSIS — C50411 Malignant neoplasm of upper-outer quadrant of right female breast: Secondary | ICD-10-CM

## 2021-04-27 DIAGNOSIS — Z5189 Encounter for other specified aftercare: Secondary | ICD-10-CM | POA: Diagnosis not present

## 2021-04-27 DIAGNOSIS — N3000 Acute cystitis without hematuria: Secondary | ICD-10-CM

## 2021-04-27 DIAGNOSIS — R197 Diarrhea, unspecified: Secondary | ICD-10-CM | POA: Diagnosis not present

## 2021-04-27 DIAGNOSIS — Z170 Estrogen receptor positive status [ER+]: Secondary | ICD-10-CM

## 2021-04-27 DIAGNOSIS — K521 Toxic gastroenteritis and colitis: Secondary | ICD-10-CM

## 2021-04-27 DIAGNOSIS — R21 Rash and other nonspecific skin eruption: Secondary | ICD-10-CM

## 2021-04-27 DIAGNOSIS — Z5112 Encounter for antineoplastic immunotherapy: Secondary | ICD-10-CM | POA: Diagnosis not present

## 2021-04-27 DIAGNOSIS — N390 Urinary tract infection, site not specified: Secondary | ICD-10-CM | POA: Insufficient documentation

## 2021-04-27 DIAGNOSIS — D6481 Anemia due to antineoplastic chemotherapy: Secondary | ICD-10-CM

## 2021-04-27 DIAGNOSIS — T451X5A Adverse effect of antineoplastic and immunosuppressive drugs, initial encounter: Secondary | ICD-10-CM

## 2021-04-27 LAB — CMP (CANCER CENTER ONLY)
ALT: 92 U/L — ABNORMAL HIGH (ref 0–44)
AST: 41 U/L (ref 15–41)
Albumin: 4 g/dL (ref 3.5–5.0)
Alkaline Phosphatase: 80 U/L (ref 38–126)
Anion gap: 7 (ref 5–15)
BUN: 22 mg/dL (ref 8–23)
CO2: 25 mmol/L (ref 22–32)
Calcium: 9.3 mg/dL (ref 8.9–10.3)
Chloride: 107 mmol/L (ref 98–111)
Creatinine: 0.76 mg/dL (ref 0.44–1.00)
GFR, Estimated: >60 mL/min (ref >60)
Glucose, Bld: 115 mg/dL — ABNORMAL HIGH (ref 70–99)
Potassium: 3.9 mmol/L (ref 3.5–5.1)
Sodium: 139 mmol/L (ref 135–145)
Total Bilirubin: 0.3 mg/dL (ref 0.3–1.2)
Total Protein: 6.6 g/dL (ref 6.5–8.1)

## 2021-04-27 LAB — CBC WITH DIFFERENTIAL (CANCER CENTER ONLY)
Abs Immature Granulocytes: 0.03 10*3/uL (ref 0.00–0.07)
Basophils Absolute: 0 10*3/uL (ref 0.0–0.1)
Basophils Relative: 0 %
Eosinophils Absolute: 0 10*3/uL (ref 0.0–0.5)
Eosinophils Relative: 0 %
HCT: 30.1 % — ABNORMAL LOW (ref 36.0–46.0)
Hemoglobin: 10.2 g/dL — ABNORMAL LOW (ref 12.0–15.0)
Immature Granulocytes: 1 %
Lymphocytes Relative: 16 %
Lymphs Abs: 0.8 10*3/uL (ref 0.7–4.0)
MCH: 30.6 pg (ref 26.0–34.0)
MCHC: 33.9 g/dL (ref 30.0–36.0)
MCV: 90.4 fL (ref 80.0–100.0)
Monocytes Absolute: 0.5 10*3/uL (ref 0.1–1.0)
Monocytes Relative: 11 %
Neutro Abs: 3.5 10*3/uL (ref 1.7–7.7)
Neutrophils Relative %: 72 %
Platelet Count: 465 10*3/uL — ABNORMAL HIGH (ref 150–400)
RBC: 3.33 MIL/uL — ABNORMAL LOW (ref 3.87–5.11)
RDW: 13.8 % (ref 11.5–15.5)
WBC Count: 4.8 10*3/uL (ref 4.0–10.5)
nRBC: 0 % (ref 0.0–0.2)

## 2021-04-27 MED ORDER — SODIUM CHLORIDE 0.9% FLUSH
10.0000 mL | INTRAVENOUS | Status: DC | PRN
  Administered 2021-04-27: 10 mL

## 2021-04-27 MED ORDER — DIPHENHYDRAMINE HCL 25 MG PO CAPS
50.0000 mg | ORAL_CAPSULE | Freq: Once | ORAL | Status: AC
  Administered 2021-04-27: 50 mg via ORAL
  Filled 2021-04-27: qty 2

## 2021-04-27 MED ORDER — TRASTUZUMAB-DKST CHEMO 150 MG IV SOLR
6.0000 mg/kg | Freq: Once | INTRAVENOUS | Status: AC
  Administered 2021-04-27: 399 mg via INTRAVENOUS
  Filled 2021-04-27: qty 19

## 2021-04-27 MED ORDER — SODIUM CHLORIDE 0.9 % IV SOLN
520.0000 mg | Freq: Once | INTRAVENOUS | Status: AC
  Administered 2021-04-27: 520 mg via INTRAVENOUS
  Filled 2021-04-27: qty 52

## 2021-04-27 MED ORDER — CIPROFLOXACIN HCL 500 MG PO TABS: 500.0000 mg | ORAL_TABLET | Freq: Two times a day (BID) | ORAL | 0 refills | Status: DC

## 2021-04-27 MED ORDER — GABAPENTIN 100 MG PO CAPS: 200.0000 mg | ORAL_CAPSULE | Freq: Every day | ORAL | 1 refills | Status: DC

## 2021-04-27 MED ORDER — SODIUM CHLORIDE 0.9 % IV SOLN
60.0000 mg/m2 | Freq: Once | INTRAVENOUS | Status: AC
  Administered 2021-04-27: 100 mg via INTRAVENOUS
  Filled 2021-04-27: qty 10

## 2021-04-27 MED ORDER — OXYCODONE HCL 5 MG PO TABS: 5.0000 mg | ORAL_TABLET | Freq: Four times a day (QID) | ORAL | 0 refills | Status: DC | PRN

## 2021-04-27 MED ORDER — PALONOSETRON HCL INJECTION 0.25 MG/5ML
0.2500 mg | Freq: Once | INTRAVENOUS | Status: AC
  Administered 2021-04-27: 0.25 mg via INTRAVENOUS
  Filled 2021-04-27: qty 5

## 2021-04-27 MED ORDER — ACETAMINOPHEN 325 MG PO TABS: 650.0000 mg | ORAL_TABLET | Freq: Once | ORAL | Status: DC

## 2021-04-27 MED ORDER — SODIUM CHLORIDE 0.9 % IV SOLN: Freq: Once | INTRAVENOUS | Status: AC

## 2021-04-27 MED ORDER — SODIUM CHLORIDE 0.9 % IV SOLN
150.0000 mg | Freq: Once | INTRAVENOUS | Status: AC
  Administered 2021-04-27: 150 mg via INTRAVENOUS
  Filled 2021-04-27: qty 150

## 2021-04-27 MED ORDER — SODIUM CHLORIDE 0.9 % IV SOLN
10.0000 mg | Freq: Once | INTRAVENOUS | Status: AC
  Administered 2021-04-27: 10 mg via INTRAVENOUS
  Filled 2021-04-27: qty 10

## 2021-04-27 MED ORDER — SODIUM CHLORIDE 0.9 % IV SOLN
420.0000 mg | Freq: Once | INTRAVENOUS | Status: AC
  Administered 2021-04-27: 420 mg via INTRAVENOUS
  Filled 2021-04-27: qty 14

## 2021-04-27 MED ORDER — HEPARIN SOD (PORK) LOCK FLUSH 100 UNIT/ML IV SOLN
500.0000 [IU] | Freq: Once | INTRAVENOUS | Status: AC | PRN
  Administered 2021-04-27: 500 [IU]

## 2021-04-27 MED ORDER — SODIUM CHLORIDE 0.9% FLUSH
10.0000 mL | INTRAVENOUS | Status: AC | PRN
  Administered 2021-04-27: 10 mL

## 2021-04-27 NOTE — Assessment & Plan Note (Addendum)
Severe skin rash from taxane.  I have dose reduced taxane from 75 mg per metered squared to 60 mg per pita square.  She was also instructed to avoid prolonged sun exposure.  She expressed understanding. ?She should report any worsening skin rash.  ?Use clindamycin gel as needed ?

## 2021-04-27 NOTE — Telephone Encounter (Signed)
Per MD visit and review and dose adjustments- ok to treat despite ALT of 92. ?

## 2021-04-27 NOTE — Patient Instructions (Signed)
[facility]  Discharge Instructions: ?Thank you for choosing [facility] to provide your oncology and hematology care.  ? ?If you have a lab appointment with the [facility], please go directly to the [facility] and check in at the registration area. ?  ?Wear comfortable clothing and clothing appropriate for easy access to any Portacath or PICC line.  ? ?We strive to give you quality time with your provider. You may need to reschedule your appointment if you arrive late (15 or more minutes).  Arriving late affects you and other patients whose appointments are after yours.  Also, if you miss three or more appointments without notifying the office, you may be dismissed from the clinic at the provider?s discretion.    ?  ?For prescription refill requests, have your pharmacy contact our office and allow 72 hours for refills to be completed.   ? ?Today you received the following chemotherapy and/or immunotherapy agents Trastuzumab, pertuzumab, docetaxel, carboplatin ?  ?To help prevent nausea and vomiting after your treatment, we encourage you to take your nausea medication as directed. ? ?BELOW ARE SYMPTOMS THAT SHOULD BE REPORTED IMMEDIATELY: ?*FEVER GREATER THAN 100.4 F (38 ?C) OR HIGHER ?*CHILLS OR SWEATING ?*NAUSEA AND VOMITING THAT IS NOT CONTROLLED WITH YOUR NAUSEA MEDICATION ?*UNUSUAL SHORTNESS OF BREATH ?*UNUSUAL BRUISING OR BLEEDING ?*URINARY PROBLEMS (pain or burning when urinating, or frequent urination) ?*BOWEL PROBLEMS (unusual diarrhea, constipation, pain near the anus) ?TENDERNESS IN MOUTH AND THROAT WITH OR WITHOUT PRESENCE OF ULCERS (sore throat, sores in mouth, or a toothache) ?UNUSUAL RASH, SWELLING OR PAIN  ?UNUSUAL VAGINAL DISCHARGE OR ITCHING  ? ?Items with * indicate a potential emergency and should be followed up as soon as possible or go to the Emergency Department if any problems should occur. ? ?Please show the CHEMOTHERAPY ALERT CARD or  IMMUNOTHERAPY ALERT CARD at check-in to the Emergency Department and triage nurse. ? ?Should you have questions after your visit or need to cancel or reschedule your appointment, please contact [facility]  Dept: [phone]  and follow the prompts.  Office hours are 8:00 a.m. to 4:30 p.m. Monday - Friday. Please note that voicemails left after 4:00 p.m. may not be returned until the following business day.  We are closed weekends and major holidays. You have access to a nurse at all times for urgent questions. Please call the main number to the clinic Dept: [phone] and follow the prompts. ? ? ?For any non-urgent questions, you may also contact your provider using MyChart. We now offer e-Visits for anyone 53 and older to request care online for non-urgent symptoms. For details visit [email or web address]. ?  ?Also download the MyChart app! Go to the app store, search "MyChart", open the app, select , and log in with your MyChart username and password. ? ?Due to Covid, a mask is required upon entering the hospital/clinic. If you do not have a mask, one will be given to you upon arrival. For doctor visits, patients may have 1 support person aged 84 or older with them. For treatment visits, patients cannot have anyone with them due to current Covid guidelines and our immunocompromised population.  ?Trastuzumab injection for infusion ?What is this medication? ? ?TRASTUZUMAB (tras TOO zoo mab) is a monoclonal antibody. It is used to treat breast cancer and stomach cancer. ?This medicine may be used for other purposes; ask your health care provider or pharmacist if you have questions. ?COMMON BRAND NAME(S):  Herceptin, Galvin Proffer, Trazimera ?What should I tell my care team before I take this medication? ?They need to know if you have any of these conditions: ?heart disease ?heart failure ?lung or breathing disease, like asthma ?an unusual or  allergic reaction to trastuzumab, benzyl alcohol, or other medications, foods, dyes, or preservatives ?pregnant or trying to get pregnant ?breast-feeding ?How should I use this medication? ?This drug is given as an infusion into a vein. It is administered in a hospital or clinic by a specially trained health care professional. ?Talk to your pediatrician regarding the use of this medicine in children. This medicine is not approved for use in children. ?Overdosage: If you think you have taken too much of this medicine contact a poison control center or emergency room at once. ?NOTE: This medicine is only for you. Do not share this medicine with others. ?What if I miss a dose? ?It is important not to miss a dose. Call your doctor or health care professional if you are unable to keep an appointment. ?What may interact with this medication? ?This medicine may interact with the following medications: ?certain types of chemotherapy, such as daunorubicin, doxorubicin, epirubicin, and idarubicin ?This list may not describe all possible interactions. Give your health care provider a list of all the medicines, herbs, non-prescription drugs, or dietary supplements you use. Also tell them if you smoke, drink alcohol, or use illegal drugs. Some items may interact with your medicine. ?What should I watch for while using this medication? ?Visit your doctor for checks on your progress. Report any side effects. Continue your course of treatment even though you feel ill unless your doctor tells you to stop. ?Call your doctor or health care professional for advice if you get a fever, chills or sore throat, or other symptoms of a cold or flu. Do not treat yourself. Try to avoid being around people who are sick. ?You may experience fever, chills and shaking during your first infusion. These effects are usually mild and can be treated with other medicines. Report any side effects during the infusion to your health care professional. Fever  and chills usually do not happen with later infusions. ?Do not become pregnant while taking this medicine or for 7 months after stopping it. Women should inform their doctor if they wish to become pregnant or think they might be pregnant. Women of child-bearing potential will need to have a negative pregnancy test before starting this medicine. There is a potential for serious side effects to an unborn child. Talk to your health care professional or pharmacist for more information. Do not breast-feed an infant while taking this medicine or for 7 months after stopping it. ?Women must use effective birth control with this medicine. ?What side effects may I notice from receiving this medication? ?Side effects that you should report to your doctor or health care professional as soon as possible: ?allergic reactions like skin rash, itching or hives, swelling of the face, lips, or tongue ?chest pain or palpitations ?cough ?dizziness ?feeling faint or lightheaded, falls ?fever ?general ill feeling or flu-like symptoms ?signs of worsening heart failure like breathing problems; swelling in your legs and feet ?unusually weak or tired ?Side effects that usually do not require medical attention (report to your doctor or health care professional if they continue or are bothersome): ?bone pain ?changes in taste ?diarrhea ?joint pain ?nausea/vomiting ?weight loss ?This list may not describe all possible side effects. Call your doctor for medical advice about side effects.  You may report side effects to FDA at 1-800-FDA-1088. ?Where should I keep my medication? ?This drug is given in a hospital or clinic and will not be stored at home. ?NOTE: This sheet is a summary. It may not cover all possible information. If you have questions about this medicine, talk to your doctor, pharmacist, or health care provider. ?? 2022 Elsevier/Gold Standard (2016-01-26 00:00:00) ? ?Pertuzumab injection ?What is this medication? ?PERTUZUMAB (per TOOZ  ue mab) is a monoclonal antibody. It is used to treat breast cancer. ?This medicine may be used for other purposes; ask your health care provider or pharmacist if you have questions. ?COMMON BRAND NAME(S): PERJET

## 2021-04-27 NOTE — Assessment & Plan Note (Signed)
She had to take 2 tablets of oxycodone to deal with the bone pain from G-CSF.  Refilled a short prescription of oxycodone.  She can use it as needed for pain management with the growth factor.  She was instructed clearly not to take it on a daily basis and she expressed understanding. ?

## 2021-04-27 NOTE — Assessment & Plan Note (Signed)
Transaminitis secondary to chemotherapy. ?This has reduced since last visit.  We will continue to monitor her LFTs. ?

## 2021-04-27 NOTE — Assessment & Plan Note (Signed)
She can take ciprofloxacin in the future if she has any other symptoms of urinary tract infection.  Refill provided.  She was however instructed to call us so we can continue with urine analysis and culture.  She currently does not have any evidence of urinary tract infection. ?

## 2021-04-27 NOTE — Assessment & Plan Note (Signed)
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  We recommended neoadjuvant chemotherapy with TCHP and she is here after cycle 1 day 1 of TCHP. ?I will dose reduce docetaxel and carboplatin given adverse effects for the first cycle.  She had severe skin rash from the taxane, 2 days of explosive diarrhea, urinary tract infection after the first cycle.  No neuropathy reported.  She is pleased with slightly smaller size of the tumor in the breast and response so far.  I have dose reduced carboplatin to AUC of 5 and docetaxel to 60 mg per metered square. ?

## 2021-04-27 NOTE — Assessment & Plan Note (Signed)
Grade 1, explosive for the first 2 days. ?She can use Imodium as needed for diarrhea.  Again the dose reduction might help with diarrhea. ?

## 2021-04-29 ENCOUNTER — Other Ambulatory Visit: Payer: Self-pay

## 2021-04-29 ENCOUNTER — Inpatient Hospital Stay: Payer: No Typology Code available for payment source

## 2021-04-29 VITALS — BP 142/88 | HR 72 | Temp 98.2°F | Resp 18

## 2021-04-29 DIAGNOSIS — Z170 Estrogen receptor positive status [ER+]: Secondary | ICD-10-CM

## 2021-04-29 DIAGNOSIS — Z5111 Encounter for antineoplastic chemotherapy: Secondary | ICD-10-CM | POA: Diagnosis not present

## 2021-04-29 MED ORDER — SODIUM CHLORIDE 0.9 % IV SOLN: Freq: Once | INTRAVENOUS | Status: AC

## 2021-04-29 MED ORDER — PEGFILGRASTIM-CBQV 6 MG/0.6ML ~~LOC~~ SOSY
6.0000 mg | PREFILLED_SYRINGE | Freq: Once | SUBCUTANEOUS | Status: AC
  Administered 2021-04-29: 6 mg via SUBCUTANEOUS
  Filled 2021-04-29: qty 0.6

## 2021-04-29 NOTE — Patient Instructions (Signed)
Pegfilgrastim Injection ?What is this medication? ?PEGFILGRASTIM (PEG fil gra stim) lowers the risk of infection in people who are receiving chemotherapy. It works by helping your body make more white blood cells, which protects your body from infection. It may also be used to help people who have been exposed to high doses of radiation. ?This medicine may be used for other purposes; ask your health care provider or pharmacist if you have questions. ?COMMON BRAND NAME(S): Fulphila, Neulasta, Nyvepria, UDENYCA, Ziextenzo ?What should I tell my care team before I take this medication? ?They need to know if you have any of these conditions: ?Kidney disease ?Latex allergy ?Ongoing radiation therapy ?Sickle cell disease ?Skin reactions to acrylic adhesives (On-Body Injector only) ?An unusual or allergic reaction to pegfilgrastim, filgrastim, other medications, foods, dyes, or preservatives ?Pregnant or trying to get pregnant ?Breast-feeding ?How should I use this medication? ?This medication is for injection under the skin. If you get this medication at home, you will be taught how to prepare and give the pre-filled syringe or how to use the On-body Injector. Refer to the patient Instructions for Use for detailed instructions. Use exactly as directed. Tell your care team immediately if you suspect that the On-body Injector may not have performed as intended or if you suspect the use of the On-body Injector resulted in a missed or partial dose. ?It is important that you put your used needles and syringes in a special sharps container. Do not put them in a trash can. If you do not have a sharps container, call your pharmacist or care team to get one. ?Talk to your care team about the use of this medication in children. While this medication may be prescribed for selected conditions, precautions do apply. ?Overdosage: If you think you have taken too much of this medicine contact a poison control center or emergency room at  once. ?NOTE: This medicine is only for you. Do not share this medicine with others. ?What if I miss a dose? ?It is important not to miss your dose. Call your care team if you miss your dose. If you miss a dose due to an On-body Injector failure or leakage, a new dose should be administered as soon as possible using a single prefilled syringe for manual use. ?What may interact with this medication? ?Interactions have not been studied. ?This list may not describe all possible interactions. Give your health care provider a list of all the medicines, herbs, non-prescription drugs, or dietary supplements you use. Also tell them if you smoke, drink alcohol, or use illegal drugs. Some items may interact with your medicine. ?What should I watch for while using this medication? ?Your condition will be monitored carefully while you are receiving this medication. ?You may need blood work done while you are taking this medication. ?Talk to your care team about your risk of cancer. You may be more at risk for certain types of cancer if you take this medication. ?If you are going to need a MRI, CT scan, or other procedure, tell your care team that you are using this medication (On-Body Injector only). ?What side effects may I notice from receiving this medication? ?Side effects that you should report to your care team as soon as possible: ?Allergic reactions--skin rash, itching, hives, swelling of the face, lips, tongue, or throat ?Capillary leak syndrome--stomach or muscle pain, unusual weakness or fatigue, feeling faint or lightheaded, decrease in the amount of urine, swelling of the ankles, hands, or feet, trouble breathing ?High   white blood cell level--fever, fatigue, trouble breathing, night sweats, change in vision, weight loss ?Inflammation of the aorta--fever, fatigue, back, chest, or stomach pain, severe headache ?Kidney injury (glomerulonephritis)--decrease in the amount of urine, red or dark brown urine, foamy or  bubbly urine, swelling of the ankles, hands, or feet ?Shortness of breath or trouble breathing ?Spleen injury--pain in upper left stomach or shoulder ?Unusual bruising or bleeding ?Side effects that usually do not require medical attention (report to your care team if they continue or are bothersome): ?Bone pain ?Pain in the hands or feet ?This list may not describe all possible side effects. Call your doctor for medical advice about side effects. You may report side effects to FDA at 1-800-FDA-1088. ?Where should I keep my medication? ?Keep out of the reach of children. ?If you are using this medication at home, you will be instructed on how to store it. Throw away any unused medication after the expiration date on the label. ?NOTE: This sheet is a summary. It may not cover all possible information. If you have questions about this medicine, talk to your doctor, pharmacist, or health care provider. ?? 2022 Elsevier/Gold Standard (2020-09-29 00:00:00) ? ? ? ?Dehydration, Adult ? ?Dehydration is condition in which there is not enough water or other fluids in the body. This happens when a person loses more fluids than he or she takes in. Important body parts cannot work right without the right amount of fluids. Any loss of fluids from the body can cause dehydration. ?Dehydration can be mild, worse, or very bad. It should be treated right away to keep it from getting very bad. ?What are the causes? ?This condition may be caused by: ?Conditions that cause loss of water or other fluids, such as: ?Watery poop (diarrhea). ?Vomiting. ?Sweating a lot. ?Peeing (urinating) a lot. ?Not drinking enough fluids, especially when you: ?Are ill. ?Are doing things that take a lot of energy to do. ?Other illnesses and conditions, such as fever or infection. ?Certain medicines, such as medicines that take extra fluid out of the body (diuretics). ?Lack of safe drinking water. ?Not being able to get enough water and food. ?What increases  the risk? ?The following factors may make you more likely to develop this condition: ?Having a long-term (chronic) illness that has not been treated the right way, such as: ?Diabetes. ?Heart disease. ?Kidney disease. ?Being 85 years of age or older. ?Having a disability. ?Living in a place that is high above the ground or sea (high in altitude). The thinner, dried air causes more fluid loss. ?Doing exercises that put stress on your body for a long time. ?What are the signs or symptoms? ?Symptoms of dehydration depend on how bad it is. ?Mild or worse dehydration ?Thirst. ?Dry lips or dry mouth. ?Feeling dizzy or light-headed, especially when you stand up from sitting. ?Muscle cramps. ?Your body making: ?Dark pee (urine). Pee may be the color of tea. ?Less pee than normal. ?Less tears than normal. ?Headache. ?Very bad dehydration ?Changes in skin. Skin may: ?Be cold to the touch (clammy). ?Be blotchy or pale. ?Not go back to normal right after you lightly pinch it and let it go. ?Little or no tears, pee, or sweat. ?Changes in vital signs, such as: ?Fast breathing. ?Low blood pressure. ?Weak pulse. ?Pulse that is more than 100 beats a minute when you are sitting still. ?Other changes, such as: ?Feeling very thirsty. ?Eyes that look hollow (sunken). ?Cold hands and feet. ?Being mixed up (confused). ?Being very  tired (lethargic) or having trouble waking from sleep. ?Short-term weight loss. ?Loss of consciousness. ?How is this treated? ?Treatment for this condition depends on how bad it is. Treatment should start right away. Do not wait until your condition gets very bad. Very bad dehydration is an emergency. You will need to go to a hospital. ?Mild or worse dehydration can be treated at home. You may be asked to: ?Drink more fluids. ?Drink an oral rehydration solution (ORS). This drink helps get the right amounts of fluids and salts and minerals in the blood (electrolytes). ?Very bad dehydration can be treated: ?With  fluids through an IV tube. ?By getting normal levels of salts and minerals in your blood. This is often done by giving salts and minerals through a tube. The tube is passed through your nose and into your sto

## 2021-05-07 ENCOUNTER — Encounter: Payer: Self-pay | Admitting: Hematology and Oncology

## 2021-05-07 ENCOUNTER — Telehealth: Payer: Self-pay | Admitting: *Deleted

## 2021-05-07 MED ORDER — FLUCONAZOLE 100 MG PO TABS: 100.0000 mg | ORAL_TABLET | Freq: Every day | ORAL | 0 refills | Status: DC

## 2021-05-07 NOTE — Telephone Encounter (Signed)
This RN spoke with pt who states she is having onset of symptoms for vaginal yeast - note pt was put on antibiotics earlier this week. ? ?She states she cannot taste ( ongoing for 20 months due to Covid )- but does not know if there is thrush orally. ? ?Prescription obtained per above. ?

## 2021-05-10 ENCOUNTER — Other Ambulatory Visit: Payer: Self-pay

## 2021-05-10 ENCOUNTER — Encounter: Payer: Self-pay | Admitting: Genetic Counselor

## 2021-05-10 ENCOUNTER — Telehealth: Payer: Self-pay | Admitting: *Deleted

## 2021-05-10 ENCOUNTER — Encounter (HOSPITAL_COMMUNITY): Payer: Self-pay | Admitting: *Deleted

## 2021-05-10 ENCOUNTER — Emergency Department (HOSPITAL_COMMUNITY): Payer: No Typology Code available for payment source

## 2021-05-10 ENCOUNTER — Emergency Department (HOSPITAL_COMMUNITY)
Admission: EM | Admit: 2021-05-10 | Discharge: 2021-05-10 | Disposition: A | Discharge status: Home / Self Care | Payer: No Typology Code available for payment source | Attending: Emergency Medicine | Admitting: Emergency Medicine

## 2021-05-10 DIAGNOSIS — C50411 Malignant neoplasm of upper-outer quadrant of right female breast: Secondary | ICD-10-CM | POA: Diagnosis not present

## 2021-05-10 DIAGNOSIS — R0789 Other chest pain: Secondary | ICD-10-CM | POA: Insufficient documentation

## 2021-05-10 DIAGNOSIS — Z170 Estrogen receptor positive status [ER+]: Secondary | ICD-10-CM

## 2021-05-10 DIAGNOSIS — R7401 Elevation of levels of liver transaminase levels: Secondary | ICD-10-CM | POA: Diagnosis not present

## 2021-05-10 DIAGNOSIS — R1013 Epigastric pain: Secondary | ICD-10-CM | POA: Diagnosis not present

## 2021-05-10 DIAGNOSIS — R079 Chest pain, unspecified: Secondary | ICD-10-CM | POA: Diagnosis present

## 2021-05-10 DIAGNOSIS — R0602 Shortness of breath: Secondary | ICD-10-CM | POA: Insufficient documentation

## 2021-05-10 LAB — CBC WITH DIFFERENTIAL/PLATELET
Abs Immature Granulocytes: 0.14 10*3/uL — ABNORMAL HIGH (ref 0.00–0.07)
Basophils Absolute: 0.1 10*3/uL (ref 0.0–0.1)
Basophils Relative: 1 %
Eosinophils Absolute: 0 10*3/uL (ref 0.0–0.5)
Eosinophils Relative: 0 %
HCT: 27.1 % — ABNORMAL LOW (ref 36.0–46.0)
Hemoglobin: 9 g/dL — ABNORMAL LOW (ref 12.0–15.0)
Immature Granulocytes: 1 %
Lymphocytes Relative: 10 %
Lymphs Abs: 1 10*3/uL (ref 0.7–4.0)
MCH: 31.7 pg (ref 26.0–34.0)
MCHC: 33.2 g/dL (ref 30.0–36.0)
MCV: 95.4 fL (ref 80.0–100.0)
Monocytes Absolute: 0.7 10*3/uL (ref 0.1–1.0)
Monocytes Relative: 6 %
Neutro Abs: 8.9 10*3/uL — ABNORMAL HIGH (ref 1.7–7.7)
Neutrophils Relative %: 82 %
Platelets: 104 10*3/uL — ABNORMAL LOW (ref 150–400)
RBC: 2.84 MIL/uL — ABNORMAL LOW (ref 3.87–5.11)
RDW: 16.3 % — ABNORMAL HIGH (ref 11.5–15.5)
WBC: 10.8 10*3/uL — ABNORMAL HIGH (ref 4.0–10.5)
nRBC: 0.2 % (ref 0.0–0.2)

## 2021-05-10 LAB — URINALYSIS, ROUTINE W REFLEX MICROSCOPIC
Bilirubin Urine: NEGATIVE
Glucose, UA: NEGATIVE mg/dL
Hgb urine dipstick: NEGATIVE
Ketones, ur: NEGATIVE mg/dL
Leukocytes,Ua: NEGATIVE
Nitrite: NEGATIVE
Protein, ur: NEGATIVE mg/dL
Specific Gravity, Urine: 1.003 — ABNORMAL LOW (ref 1.005–1.030)
pH: 8 (ref 5.0–8.0)

## 2021-05-10 LAB — COMPREHENSIVE METABOLIC PANEL
ALT: 148 U/L — ABNORMAL HIGH (ref 0–44)
AST: 52 U/L — ABNORMAL HIGH (ref 15–41)
Albumin: 3.2 g/dL — ABNORMAL LOW (ref 3.5–5.0)
Alkaline Phosphatase: 142 U/L — ABNORMAL HIGH (ref 38–126)
Anion gap: 6 (ref 5–15)
BUN: 15 mg/dL (ref 8–23)
CO2: 23 mmol/L (ref 22–32)
Calcium: 8.4 mg/dL — ABNORMAL LOW (ref 8.9–10.3)
Chloride: 108 mmol/L (ref 98–111)
Creatinine, Ser: 0.57 mg/dL (ref 0.44–1.00)
GFR, Estimated: >60 mL/min (ref >60)
Glucose, Bld: 118 mg/dL — ABNORMAL HIGH (ref 70–99)
Potassium: 3.7 mmol/L (ref 3.5–5.1)
Sodium: 137 mmol/L (ref 135–145)
Total Bilirubin: 0.3 mg/dL (ref 0.3–1.2)
Total Protein: 5.8 g/dL — ABNORMAL LOW (ref 6.5–8.1)

## 2021-05-10 LAB — BRAIN NATRIURETIC PEPTIDE: B Natriuretic Peptide: 153.6 pg/mL — ABNORMAL HIGH (ref 0.0–100.0)

## 2021-05-10 LAB — PROTIME-INR
INR: 1.1 (ref 0.8–1.2)
Prothrombin Time: 14.4 seconds (ref 11.4–15.2)

## 2021-05-10 LAB — TROPONIN I (HIGH SENSITIVITY)
Troponin I (High Sensitivity): 3 ng/L (ref <18)
Troponin I (High Sensitivity): 4 ng/L (ref <18)

## 2021-05-10 LAB — D-DIMER, QUANTITATIVE: D-Dimer, Quant: 0.33 ug/mL-FEU (ref 0.00–0.50)

## 2021-05-10 IMAGING — CR DG CHEST 2V
2 series · 2 of 2 positions shown · non-contrast
Comparison: [DATE] chest radiograph.

CLINICAL DATA: Dyspnea, chest pain, breast cancer on chemotherapy

EXAM:
CHEST - 2 VIEW

[w chest pa]
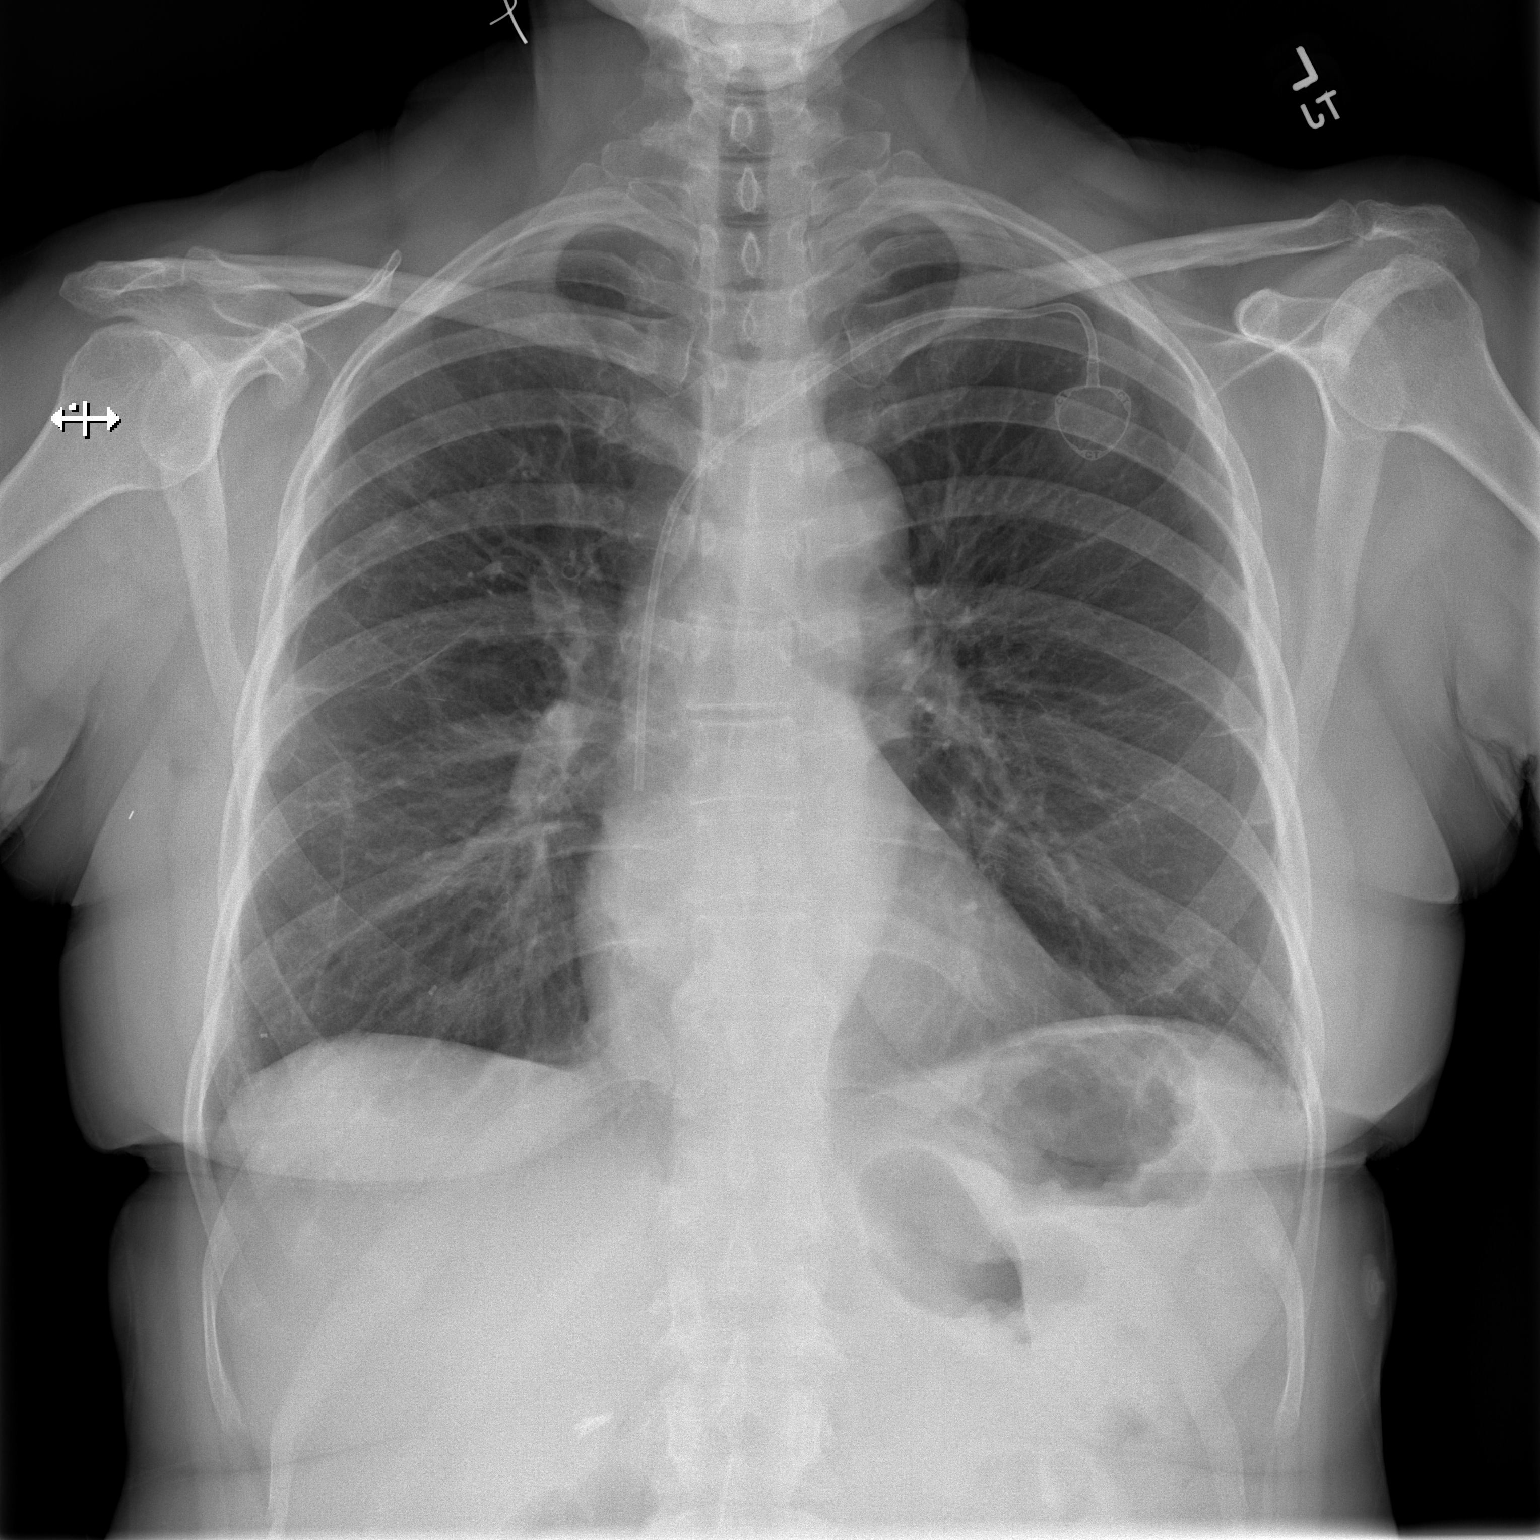

[w chest lat]
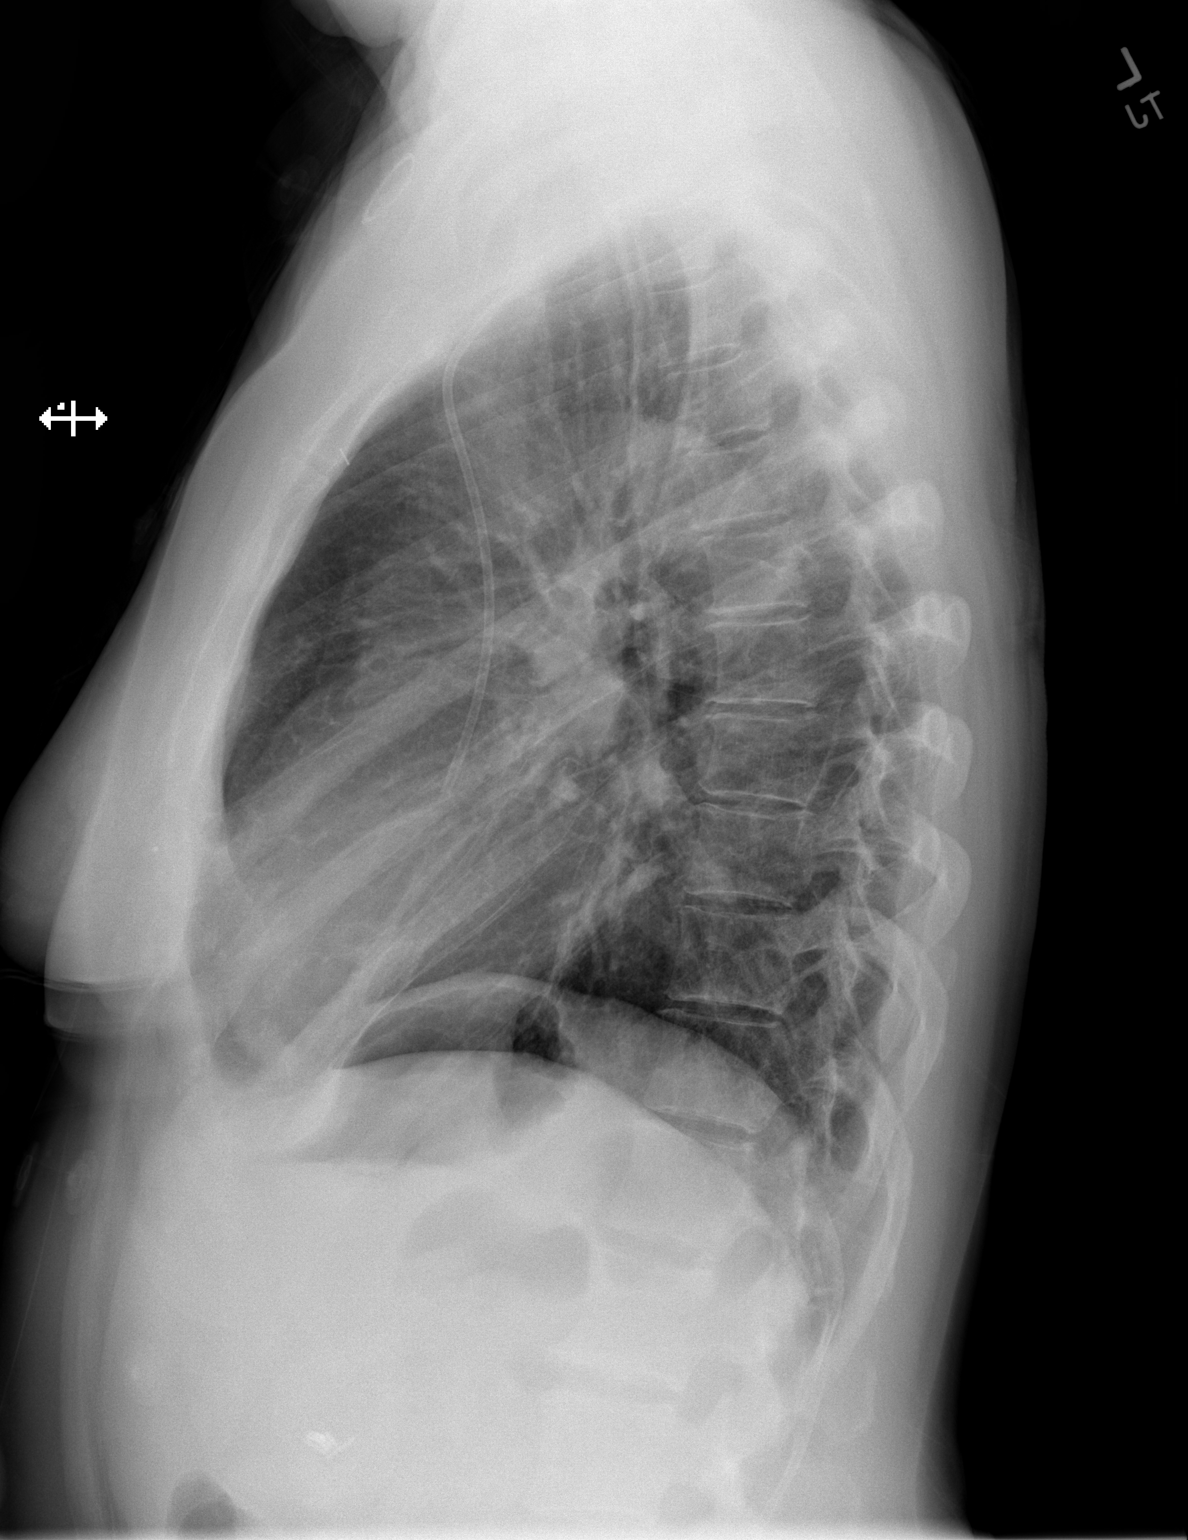

[2 of 2 positions shown; findings below may reference images not displayed]

FINDINGS: Left subclavian Port-A-Cath terminates over the cavoatrial junction.
Stable cardiomediastinal silhouette with normal heart size. No
pneumothorax. No pleural effusion. No pulmonary edema. Minimal
curvilinear scarring versus atelectasis in the mid to lower lungs
bilaterally, unchanged. No acute consolidative airspace disease.
Cholecystectomy clips are seen in the right upper quadrant of the
abdomen.
IMPRESSION: Minimal curvilinear scarring versus atelectasis in the mid to lower
lungs bilaterally, unchanged. Otherwise no active cardiopulmonary
disease.

## 2021-05-10 MED ORDER — PANTOPRAZOLE SODIUM 40 MG PO TBEC: 40.0000 mg | DELAYED_RELEASE_TABLET | Freq: Every day | ORAL | 0 refills | Status: DC

## 2021-05-10 MED ORDER — ALBUTEROL SULFATE HFA 108 (90 BASE) MCG/ACT IN AERS: 2.0000 | INHALATION_SPRAY | RESPIRATORY_TRACT | Status: DC | PRN

## 2021-05-10 MED ORDER — ACETAMINOPHEN 500 MG PO TABS
1000.0000 mg | ORAL_TABLET | Freq: Once | ORAL | Status: AC
Start: 2021-05-10 — End: 2021-05-10
  Administered 2021-05-10: 1000 mg via ORAL
  Filled 2021-05-10: qty 2

## 2021-05-10 NOTE — ED Triage Notes (Signed)
Pt sent due to chest pain and shob. She is due for chemo again next week. Pain constant tightness with pain with swallowing.  ?

## 2021-05-10 NOTE — Telephone Encounter (Signed)
Contacted patient following VM stating she had chest tightness and pain with increased pain with deep breath.  ? ?Patient states she was very physically active yesterday:using wheelbarrow and lifted pots of flowers, but states this does not feel like past muscular aches. States pain is "like nothing she has ever had". ? ?Advised patient to go to ED - do not drive herself. If no one available to drive her she should call 911. She states husband can take her and they will go as soon as she can. ? ?Dr. Chryl Heck informed of patient call/message/symptoms and recommendation ?

## 2021-05-10 NOTE — ED Provider Notes (Signed)
?[facility] DEPT ?Provider Note ? ? ?CSN: [id number] ?Arrival date & time: 05/10/21  1535 ? ?  ? ?History ? ?Chief Complaint  ?Patient presents with  ? Shortness of Breath  ? ? ?[name] [name] is a 61 y.o. female presenting today with central chest pain and mild epigastric pain started 24 hours ago.  Also endorses mild shortness of breath.  Pain is dull, worsened by inspiration or laughing, and nonradiating.  Epigastric pain mildly worsened by palpation.  Actively undergoing cancer treatment for malignancy of right breast.  Denies active nausea, vomiting, diarrhea, constipation, or abdominal pain.  Denies recent fever or illness.  Due for chemo again next week.  Also receives WBC injections, most recent 04/29/2021.  Not on anticoagulation.  No prior hx of MI, CVA, DVT.  Denies leg pain, hemoptysis, unilateral swelling.  Hx of atrial flutter. ? ? ?Shortness of Breath ?Associated symptoms: abdominal pain (Epigastric) and chest pain   ? ?  ? ?Home Medications ?Prior to Admission medications   ?Medication Sig Start Date End Date Taking? Authorizing Provider  ?pantoprazole (PROTONIX) 40 MG tablet Take 1 tablet (40 mg total) by mouth daily for 14 days. 05/10/21 05/24/21 Yes Prince Rome, PA-C  ?Cholecalciferol (VITAMIN D3) 125 MCG (5000 UT) CAPS Take 10,000 Units by mouth daily.    [provider]  ?ciprofloxacin (CIPRO) 500 MG tablet Take 1 tablet (500 mg total) by mouth 2 (two) times daily. 04/27/21   Benay Pike, MD  ?clindamycin (CLINDAGEL) 1 % gel Apply topically 2 (two) times daily. 04/16/21   Benay Pike, MD  ?dexamethasone (DECADRON) 4 MG tablet Take 2 tablets (8 mg total) by mouth 2 (two) times daily. Start the day before Taxotere. Then take daily x 3 days after chemotherapy. 03/17/21   Benay Pike, MD  ?fluconazole (DIFLUCAN) 100 MG tablet Take 1 tablet (100 mg total) by mouth daily. Take 2 tablets with first dose then 1 tab daily 05/07/21   Benay Pike, MD   ?gabapentin (NEURONTIN) 100 MG capsule Take 2 capsules (200 mg total) by mouth at bedtime. 04/27/21   Benay Pike, MD  ?lidocaine-prilocaine (EMLA) cream Apply to affected area once 03/17/21   Benay Pike, MD  ?Multiple Vitamins-Minerals (ZINC PO) Take 1 tablet by mouth daily.    [provider]  ?ondansetron (ZOFRAN) 8 MG tablet Take 1 tablet (8 mg total) by mouth 2 (two) times daily as needed (Nausea or vomiting). Start on the third day after chemotherapy. 03/17/21   Benay Pike, MD  ?oxyCODONE (OXY IR/ROXICODONE) 5 MG immediate release tablet Take 1 tablet (5 mg total) by mouth every 6 (six) hours as needed for severe pain. 04/27/21   Benay Pike, MD  ?Probiotic Product (PROBIOTIC DAILY PO) Take 1 tablet by mouth daily.     [provider]  ?prochlorperazine (COMPAZINE) 10 MG tablet Take 1 tablet (10 mg total) by mouth every 6 (six) hours as needed (Nausea or vomiting). 03/17/21   Benay Pike, MD  ?   ? ?Allergies    ?Bee venom   ? ?Review of Systems   ?Review of Systems  ?Respiratory:  Positive for shortness of breath.   ?Cardiovascular:  Positive for chest pain.  ?Gastrointestinal:  Positive for abdominal pain (Epigastric).  ? ?Physical Exam ?Updated Vital Signs ?BP 130/78   Pulse 87   Temp 98.6 ?F (37 ?C)   Resp 18   Ht '5\' 2"'$  (1.575 m)   Wt 66.7 kg   LMP 09/09/2019  SpO2 100%   BMI 26.89 kg/m?  ?Physical Exam ?Vitals and nursing note reviewed.  ?Constitutional:   ?   General: She is not in acute distress. ?   Appearance: She is well-developed. She is not ill-appearing or diaphoretic.  ?HENT:  ?   Head: Normocephalic and atraumatic.  ?Eyes:  ?   Conjunctiva/sclera: Conjunctivae normal.  ?Cardiovascular:  ?   Rate and Rhythm: Normal rate and regular rhythm.  ?   Pulses: Normal pulses.  ?   Heart sounds: Normal heart sounds. No murmur heard. ?Pulmonary:  ?   Effort: Pulmonary effort is normal. No tachypnea, accessory muscle usage or respiratory distress.  ?   Breath  sounds: Normal breath sounds. No decreased breath sounds or wheezing.  ?Chest:  ?   Chest wall: No tenderness.  ?Abdominal:  ?   General: Bowel sounds are normal.  ?   Palpations: Abdomen is soft.  ?   Tenderness: There is abdominal tenderness in the epigastric area.  ?   Comments: Mild tenderness  ?Musculoskeletal:     ?   General: No swelling.  ?   Cervical back: Neck supple.  ?   Right lower leg: No tenderness. No edema.  ?   Left lower leg: No tenderness. No edema.  ?Skin: ?   General: Skin is warm and dry.  ?   Capillary Refill: Capillary refill takes less than 2 seconds.  ?   Coloration: Skin is not cyanotic or pale.  ?   Findings: No ecchymosis or erythema.  ?Neurological:  ?   Mental Status: She is alert and oriented to person, place, and time.  ?Psychiatric:     ?   Mood and Affect: Mood normal.  ? ? ?ED Results / Procedures / Treatments   ?Labs ?(all labs ordered are listed, but only abnormal results are displayed) ?Labs Reviewed  ?BRAIN NATRIURETIC PEPTIDE - Abnormal; Notable for the following components:  ?    Result Value  ? B Natriuretic Peptide 153.6 (*)   ? All other components within normal limits  ?CBC WITH DIFFERENTIAL/PLATELET - Abnormal; Notable for the following components:  ? WBC 10.8 (*)   ? RBC 2.84 (*)   ? Hemoglobin 9.0 (*)   ? HCT 27.1 (*)   ? RDW 16.3 (*)   ? All other components within normal limits  ?COMPREHENSIVE METABOLIC PANEL - Abnormal; Notable for the following components:  ? Glucose, Bld 118 (*)   ? Calcium 8.4 (*)   ? Total Protein 5.8 (*)   ? Albumin 3.2 (*)   ? AST 52 (*)   ? ALT 148 (*)   ? Alkaline Phosphatase 142 (*)   ? All other components within normal limits  ?URINALYSIS, ROUTINE W REFLEX MICROSCOPIC - Abnormal; Notable for the following components:  ? Color, Urine COLORLESS (*)   ? Specific Gravity, Urine 1.003 (*)   ? All other components within normal limits  ?PROTIME-INR  ?D-DIMER, QUANTITATIVE  ?TROPONIN I (HIGH SENSITIVITY)  ?TROPONIN I (HIGH SENSITIVITY)   ? ? ?EKG ?None ? ?Radiology ?DG Chest 2 View ? ?Result Date: 05/10/2021 ?CLINICAL DATA:  Dyspnea, chest pain, breast cancer on chemotherapy EXAM: CHEST - 2 VIEW COMPARISON:  04/06/2021 chest radiograph. FINDINGS: Left subclavian Port-A-Cath terminates over the cavoatrial junction. Stable cardiomediastinal silhouette with normal heart size. No pneumothorax. No pleural effusion. No pulmonary edema. Minimal curvilinear scarring versus atelectasis in the mid to lower lungs bilaterally, unchanged. No acute consolidative airspace disease. Cholecystectomy clips are seen  in the right upper quadrant of the abdomen. IMPRESSION: Minimal curvilinear scarring versus atelectasis in the mid to lower lungs bilaterally, unchanged. Otherwise no active cardiopulmonary disease. Electronically Signed   By: Ilona Sorrel M.D.   On: 05/10/2021 16:44   ? ?Procedures ?Procedures  ? ? ?Medications Ordered in ED ?Medications  ?albuterol (VENTOLIN HFA) 108 (90 Base) MCG/ACT inhaler 2 puff (has no administration in time range)  ?acetaminophen (TYLENOL) tablet 1,000 mg (1,000 mg Oral Given 05/10/21 1830)  ? ? ?ED Course/ Medical Decision Making/ A&P ?  ?                        ?Medical Decision Making ?Amount and/or Complexity of Data Reviewed ?External Data Reviewed: notes. ?Labs: ordered. Decision-making details documented in ED Course. ?Radiology: ordered and independent interpretation performed. Decision-making details documented in ED Course. ?ECG/medicine tests: ordered and independent interpretation performed. Decision-making details documented in ED Course. ? ?Risk ?OTC drugs. ?Prescription drug management. ? ? ?61 y.o. female presents to the ED for concern of Shortness of Breath ? Marland Kitchen  This involves an extensive number of treatment options, and is a complaint that carries with it a high risk of complications and morbidity.  The emergent differential diagnosis prior to evaluation includes, but is not limited to: acute coronary syndrome,  pulmonary embolism, pneumothorax, pneumonia, pleural effusion, CHF, pulmonary edema, GERD ? ?This is not an exhaustive differential.  ? ?Past Medical History / Co-morbidities / Social History: ?Undergoing cancer treatment for [place]

## 2021-05-10 NOTE — Discharge Instructions (Addendum)
Follow-up with your oncologist within the next 2 to 3 days for reevaluation and continued medical management ? ?A medication by the name of pantoprazole has been sent to your pharmacy.  Please take 1 tablet daily for the next 2 weeks.  This may help relieve some of the lower central chest pain. ? ?Return to the ED for new or worsening symptoms as discussed ?

## 2021-05-10 NOTE — ED Notes (Signed)
Pt reports pain is moving from head and into arms and legs.  Sts the Tylenol should knock it out.  ?

## 2021-05-11 ENCOUNTER — Telehealth: Payer: Self-pay | Admitting: *Deleted

## 2021-05-11 ENCOUNTER — Other Ambulatory Visit: Payer: Self-pay | Admitting: Hematology and Oncology

## 2021-05-11 MED ORDER — OXYCODONE HCL 5 MG PO TABS: 5.0000 mg | ORAL_TABLET | Freq: Two times a day (BID) | ORAL | 0 refills | Status: DC | PRN

## 2021-05-11 NOTE — Telephone Encounter (Signed)
Patient went to ED yesterday for evaluation of chest/back pain following recommendation from this office. Patient given 14 day course of Protonix for epi-gastric pain.  ? ?ED told her she could damage her liver by taking too much tylenol or aleve.  Per ED: "Takes 1000 mg Tylenol 3-4 times per day.  Also takes 1 Aleve about every 12 hours.  The patient would benefit from a short course of PPI and possibly reduction in Tylenol and/or Aleve consumption"  ?ED told her to discuss other pain control options with oncology.  ? ?She's taken 2 Roxicodone in past 24 hours. she doesn't like to take them, but is in pain. States she is having so much pain that all she's able to do right now is lay in the recliner and that that isn't like her. ? ?Original Rx from Dr. Chryl Heck was for 8 tabs, she has 4 left  She asked what Dr. Chryl Heck would advise at this time for pain control. ? ?Dr. Chryl Heck informed of patient information and concerns. Per Dr. Chryl Heck  - she will refill patient's pain med and discuss pain control options with patient.  ? ?Patient informed of Dr. Rob Hickman plan. Patient instructed to contact office if pain does not decrease or if it increases. Patient verbalized understanding of information. ?

## 2021-05-11 NOTE — Progress Notes (Signed)
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 ? ?

## 2021-05-18 ENCOUNTER — Encounter: Payer: Self-pay | Admitting: Adult Health

## 2021-05-18 DIAGNOSIS — Z95828 Presence of other vascular implants and grafts: Secondary | ICD-10-CM | POA: Insufficient documentation

## 2021-05-19 ENCOUNTER — Inpatient Hospital Stay (HOSPITAL_BASED_OUTPATIENT_CLINIC_OR_DEPARTMENT_OTHER): Payer: No Typology Code available for payment source | Admitting: Hematology and Oncology

## 2021-05-19 ENCOUNTER — Other Ambulatory Visit: Payer: Self-pay

## 2021-05-19 ENCOUNTER — Other Ambulatory Visit: Payer: Self-pay | Admitting: Hematology and Oncology

## 2021-05-19 ENCOUNTER — Inpatient Hospital Stay: Payer: No Typology Code available for payment source

## 2021-05-19 ENCOUNTER — Encounter: Payer: Self-pay | Admitting: Hematology and Oncology

## 2021-05-19 VITALS — BP 152/78 | HR 70 | Temp 98.3°F | Resp 18 | Wt 147.8 lb

## 2021-05-19 DIAGNOSIS — Z170 Estrogen receptor positive status [ER+]: Secondary | ICD-10-CM

## 2021-05-19 DIAGNOSIS — C50411 Malignant neoplasm of upper-outer quadrant of right female breast: Secondary | ICD-10-CM | POA: Diagnosis not present

## 2021-05-19 DIAGNOSIS — M898X9 Other specified disorders of bone, unspecified site: Secondary | ICD-10-CM

## 2021-05-19 DIAGNOSIS — R7401 Elevation of levels of liver transaminase levels: Secondary | ICD-10-CM

## 2021-05-19 DIAGNOSIS — K521 Toxic gastroenteritis and colitis: Secondary | ICD-10-CM

## 2021-05-19 DIAGNOSIS — Z95828 Presence of other vascular implants and grafts: Secondary | ICD-10-CM

## 2021-05-19 DIAGNOSIS — Z5111 Encounter for antineoplastic chemotherapy: Secondary | ICD-10-CM | POA: Diagnosis not present

## 2021-05-19 DIAGNOSIS — T451X5A Adverse effect of antineoplastic and immunosuppressive drugs, initial encounter: Secondary | ICD-10-CM

## 2021-05-19 LAB — CBC WITH DIFFERENTIAL (CANCER CENTER ONLY)
Abs Immature Granulocytes: 0.03 10*3/uL (ref 0.00–0.07)
Basophils Absolute: 0 10*3/uL (ref 0.0–0.1)
Basophils Relative: 0 %
Eosinophils Absolute: 0 10*3/uL (ref 0.0–0.5)
Eosinophils Relative: 0 %
HCT: 25.1 % — ABNORMAL LOW (ref 36.0–46.0)
Hemoglobin: 8.4 g/dL — ABNORMAL LOW (ref 12.0–15.0)
Immature Granulocytes: 1 %
Lymphocytes Relative: 19 %
Lymphs Abs: 0.9 10*3/uL (ref 0.7–4.0)
MCH: 31.2 pg (ref 26.0–34.0)
MCHC: 33.5 g/dL (ref 30.0–36.0)
MCV: 93.3 fL (ref 80.0–100.0)
Monocytes Absolute: 0.3 10*3/uL (ref 0.1–1.0)
Monocytes Relative: 7 %
Neutro Abs: 3.2 10*3/uL (ref 1.7–7.7)
Neutrophils Relative %: 73 %
Platelet Count: 454 10*3/uL — ABNORMAL HIGH (ref 150–400)
RBC: 2.69 MIL/uL — ABNORMAL LOW (ref 3.87–5.11)
RDW: 15.9 % — ABNORMAL HIGH (ref 11.5–15.5)
WBC Count: 4.4 10*3/uL (ref 4.0–10.5)
nRBC: 0 % (ref 0.0–0.2)

## 2021-05-19 LAB — CMP (CANCER CENTER ONLY)
ALT: 94 U/L — ABNORMAL HIGH (ref 0–44)
AST: 45 U/L — ABNORMAL HIGH (ref 15–41)
Albumin: 3.7 g/dL (ref 3.5–5.0)
Alkaline Phosphatase: 136 U/L — ABNORMAL HIGH (ref 38–126)
Anion gap: 9 (ref 5–15)
BUN: 21 mg/dL (ref 8–23)
CO2: 26 mmol/L (ref 22–32)
Calcium: 9.2 mg/dL (ref 8.9–10.3)
Chloride: 104 mmol/L (ref 98–111)
Creatinine: 0.67 mg/dL (ref 0.44–1.00)
GFR, Estimated: >60 mL/min (ref >60)
Glucose, Bld: 148 mg/dL — ABNORMAL HIGH (ref 70–99)
Potassium: 4 mmol/L (ref 3.5–5.1)
Sodium: 139 mmol/L (ref 135–145)
Total Bilirubin: 0.2 mg/dL — ABNORMAL LOW (ref 0.3–1.2)
Total Protein: 6.5 g/dL (ref 6.5–8.1)

## 2021-05-19 MED ORDER — SODIUM CHLORIDE 0.9 % IV SOLN
150.0000 mg | Freq: Once | INTRAVENOUS | Status: AC
  Administered 2021-05-19: 150 mg via INTRAVENOUS
  Filled 2021-05-19: qty 150

## 2021-05-19 MED ORDER — SODIUM CHLORIDE 0.9 % IV SOLN
60.0000 mg/m2 | Freq: Once | INTRAVENOUS | Status: AC
  Administered 2021-05-19: 100 mg via INTRAVENOUS
  Filled 2021-05-19: qty 10

## 2021-05-19 MED ORDER — SODIUM CHLORIDE 0.9% FLUSH
10.0000 mL | INTRAVENOUS | Status: DC | PRN
  Administered 2021-05-19: 10 mL

## 2021-05-19 MED ORDER — DIPHENHYDRAMINE HCL 25 MG PO CAPS
50.0000 mg | ORAL_CAPSULE | Freq: Once | ORAL | Status: AC
  Administered 2021-05-19: 50 mg via ORAL
  Filled 2021-05-19: qty 2

## 2021-05-19 MED ORDER — SODIUM CHLORIDE 0.9 % IV SOLN: Freq: Once | INTRAVENOUS | Status: AC

## 2021-05-19 MED ORDER — SODIUM CHLORIDE 0.9 % IV SOLN
10.0000 mg | Freq: Once | INTRAVENOUS | Status: AC
  Administered 2021-05-19: 10 mg via INTRAVENOUS
  Filled 2021-05-19: qty 10

## 2021-05-19 MED ORDER — ACETAMINOPHEN 325 MG PO TABS
650.0000 mg | ORAL_TABLET | Freq: Once | ORAL | Status: AC
  Administered 2021-05-19: 650 mg via ORAL
  Filled 2021-05-19: qty 2

## 2021-05-19 MED ORDER — SODIUM CHLORIDE 0.9% FLUSH
10.0000 mL | Freq: Once | INTRAVENOUS | Status: AC
  Administered 2021-05-19: 10 mL

## 2021-05-19 MED ORDER — HEPARIN SOD (PORK) LOCK FLUSH 100 UNIT/ML IV SOLN
500.0000 [IU] | Freq: Once | INTRAVENOUS | Status: AC | PRN
  Administered 2021-05-19: 500 [IU]

## 2021-05-19 MED ORDER — PALONOSETRON HCL INJECTION 0.25 MG/5ML
0.2500 mg | Freq: Once | INTRAVENOUS | Status: AC
  Administered 2021-05-19: 0.25 mg via INTRAVENOUS
  Filled 2021-05-19: qty 5

## 2021-05-19 MED ORDER — SODIUM CHLORIDE 0.9 % IV SOLN
420.0000 mg | Freq: Once | INTRAVENOUS | Status: AC
  Administered 2021-05-19: 420 mg via INTRAVENOUS
  Filled 2021-05-19: qty 14

## 2021-05-19 MED ORDER — TRASTUZUMAB-DKST CHEMO 150 MG IV SOLR
6.0000 mg/kg | Freq: Once | INTRAVENOUS | Status: AC
  Administered 2021-05-19: 399 mg via INTRAVENOUS
  Filled 2021-05-19: qty 19

## 2021-05-19 MED ORDER — SODIUM CHLORIDE 0.9 % IV SOLN
510.0000 mg | Freq: Once | INTRAVENOUS | Status: AC
  Administered 2021-05-19: 510 mg via INTRAVENOUS
  Filled 2021-05-19: qty 51

## 2021-05-19 NOTE — Assessment & Plan Note (Signed)
Likely from chemotherapy.  We will continue monitoring and it is stable for now ?

## 2021-05-19 NOTE — Progress Notes (Signed)
[facility] Follow up: ?  ? ?[name], MD ?[place] [place] ?[place] [address] ? ? ?DIAGNOSIS:  Cancer Staging  ?Malignant neoplasm of upper-outer quadrant of right breast in female, estrogen receptor positive ([place]) ?Staging form: Breast, AJCC 8th Edition ?- Clinical stage from 03/17/2021: Stage IB (cT2, cN0, cM0, G2, ER+, PR+, HER2+) - Unsigned ?Stage prefix: Initial diagnosis ?Method of lymph node assessment: Clinical ?Histologic grading system: 3 grade system ? ? ?SUMMARY OF ONCOLOGIC HISTORY: ?Oncology History  ?Malignant neoplasm of upper-outer quadrant of right breast in female, estrogen receptor positive ([place])  ?03/16/2021 Initial Diagnosis  ? Malignant neoplasm of upper-outer quadrant of right breast in female, estrogen receptor positive ([place]) ? ?  ?04/07/2021 -  Chemotherapy  ? Patient is on Treatment Plan : BREAST  Docetaxel + Carboplatin + Trastuzumab + Pertuzumab  (TCHP) q21d   ? ?  ?  ? Genetic Testing  ? [facility] CustomNext Panel was Negative. Report date is 03/28/2021. ? ?The CustomNext-Cancer+RNAinsight panel offered by [employer] Forts includes sequencing and rearrangement analysis for the following 47 genes:  APC, ATM, AXIN2, BARD1, BMPR1A, BRCA1, BRCA2, BRIP1, CDH1, CDK4, CDKN2A, CHEK2, CTNNA1, DICER1, EPCAM, GREM1, HOXB13, KIT, MEN1, MLH1, MSH2, MSH3, MSH6, MUTYH, NBN, NF1, NTHL1, PALB2, PDGFRA, PMS2, POLD1, POLE, PTEN, RAD50, RAD51C, RAD51D, SDHA, SDHB, SDHC, SDHD, SMAD4, SMARCA4, STK11, TP53, TSC1, TSC2, and VHL.  RNA data is routinely analyzed for use in variant interpretation for all genes. ?  ? ? ?CURRENT THERAPY: TCHP  ? ?INTERVAL HISTORY: ?[name] [name] 61 y.o. female returns for evaluation after receiving cycle 2 of TCHP. She tolerated it relatively well. ?No major episodes of diarrhea, UTI, nausea or vomiting. She had severe sternal pain for a couple days, needed to go to the ED. ?She had to take 4 tablets of oxycodone the day when she had severe chest  pain.  This has finally resolved.   ?With the lower dose, she definitely feels that it was better tolerated.  She is quite thrilled that the breast mass has become nonpalpable for her. ?She is otherwise noted alopecia.  She was hoping to get another refill of the antibiotic for UTI in case she has another urinary tract infection. ? ?Rest of the pertinent 10 point ROS reviewed and negative. ? ?Patient Active Problem List  ? Diagnosis Date Noted  ? Port-A-Cath in place 05/18/2021  ? Chemotherapy induced diarrhea 04/27/2021  ? Rash and nonspecific skin eruption 04/27/2021  ? UTI (urinary tract infection) 04/27/2021  ? 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 Transaminitis 04/27/2021  ? Genetic testing 03/31/2021  ? Malignant neoplasm of upper-outer quadrant of right breast in female, estrogen receptor positive ([place]) 03/16/2021  ? Typical atrial flutter ([place]) 01/01/2019  ? ? ?is allergic to bee venom. ? ?MEDICAL HISTORY: ?Past Medical History:  ?Diagnosis Date  ? Abnormal bleeding in menstrual cycle   ? Abnormal Pap smear of vagina   ? Breast cancer ([place])   ? COVID 08/2019  ? Endometrial polyp   ? Hot flashes   ? Irregular heart beats   ? PONV (postoperative nausea and vomiting)   ? Typical atrial flutter ([place]) 01/01/2019  ? ? ?SURGICAL HISTORY: ?Past Surgical History:  ?Procedure Laterality Date  ? BREAST BIOPSY Right 04/05/2021  ? BREAST IMPLANT REMOVAL Bilateral 2021  ? breast implants Right 2002  ? CHOLECYSTECTOMY    ? PORTACATH PLACEMENT N/A 04/06/2021  ? Procedure: INSERTION PORT-A-CATH;  Surgeon: Stark Klein,  MD;  Location: [facility];  Service: General;  Laterality: N/A;  ? SHOULDER SURGERY    ? Rotator cuff repair  ? ? ?SOCIAL HISTORY: ?Social History  ? ?Socioeconomic History  ? Marital status: Married  ?  Spouse name: Not on file  ? Number of children: Not on file  ? Years of education: Not on file  ? Highest education level: Not on file  ?Occupational History  ? Not on file  ?Tobacco  Use  ? Smoking status: Never  ? Smokeless tobacco: Never  ?Vaping Use  ? Vaping Use: Never used  ?Substance and Sexual Activity  ? Alcohol use: Not Currently  ?  Alcohol/week: 14.0 standard drinks  ?  Types: 14 Cans of beer per week  ? Drug use: Not Currently  ? Sexual activity: Not on file  ?Other Topics Concern  ? Not on file  ?Social History Narrative  ? Not on file  ? ?Social Determinants of Health  ? ?Financial Resource Strain: Not on file  ?Food Insecurity: Not on file  ?Transportation Needs: Not on file  ?Physical Activity: Not on file  ?Stress: Not on file  ?Social Connections: Not on file  ?Intimate Partner Violence: Not on file  ? ? ?FAMILY HISTORY: ?Family History  ?Problem Relation Age of Onset  ? Heart attack Mother   ? ? ?Review of Systems  ?Constitutional:  Positive for fatigue. Negative for appetite change, chills, fever and unexpected weight change.  ?HENT:   Negative for hearing loss, lump/mass, mouth sores and trouble swallowing.   ?Eyes:  Negative for eye problems and icterus.  ?Respiratory:  Negative for chest tightness, cough and shortness of breath.   ?Cardiovascular:  Negative for chest pain, leg swelling and palpitations.  ?Gastrointestinal:  Positive for diarrhea (mild diarrhea). Negative for abdominal distention, abdominal pain, constipation, nausea and vomiting.  ?Endocrine: Negative for hot flashes.  ?Genitourinary:  Negative for difficulty urinating and dysuria (Resolved).   ?Musculoskeletal:  Negative for arthralgias.  ?Skin:  Positive for rash (milder compared to first cycle). Negative for itching.  ?Neurological:  Negative for dizziness, extremity weakness, headaches and numbness.  ?Hematological:  Negative for adenopathy. Does not bruise/bleed easily.  ?Psychiatric/Behavioral:  Negative for depression. The patient is not nervous/anxious.    ? ? ?PHYSICAL EXAMINATION ? ?ECOG PERFORMANCE STATUS: 1 - Symptomatic but completely ambulatory ? ?There were no vitals filed for this  visit. ? ? ?Physical Exam ?Constitutional:   ?   General: She is not in acute distress. ?   Appearance: Normal appearance. She is not toxic-appearing.  ?HENT:  ?   Head: Normocephalic and atraumatic.  ?   Comments: Rash nearly not as severe as with the first cycle ?Eyes:  ?   General: No scleral icterus. ?Cardiovascular:  ?   Rate and Rhythm: Normal rate and regular rhythm.  ?   Pulses: Normal pulses.  ?   Heart sounds: Normal heart sounds.  ?Pulmonary:  ?   Effort: Pulmonary effort is normal.  ?   Breath sounds: Normal breath sounds.  ?Chest:  ?   Comments: Right breast mass at 9 o'clock position appears significantly smaller, now about a cm. ?Abdominal:  ?   General: Abdomen is flat. Bowel sounds are normal. There is no distension.  ?   Palpations: Abdomen is soft.  ?   Tenderness: There is no abdominal tenderness.  ?Musculoskeletal:     ?   General: No swelling.  ?   Cervical back: Neck supple.  ?  Lymphadenopathy:  ?   Cervical: No cervical adenopathy.  ?Skin: ?   General: Skin is warm and dry.  ?   Findings: No rash.  ?Neurological:  ?   General: No focal deficit present.  ?   Mental Status: She is alert.  ?Psychiatric:     ?   Mood and Affect: Mood normal.     ?   Behavior: Behavior normal.  ? ? ?LABORATORY DATA: ? ?CBC ?   ?Component Value Date/Time  ? WBC 4.4 05/19/2021 1123  ? WBC 10.8 (H) 05/10/2021 1720  ? RBC 2.69 (L) 05/19/2021 1123  ? HGB 8.4 (L) 05/19/2021 1123  ? HGB 12.9 12/03/2018 1624  ? HCT 25.1 (L) 05/19/2021 1123  ? HCT 38.5 12/03/2018 1624  ? PLT 454 (H) 05/19/2021 1123  ? PLT 255 12/03/2018 1624  ? MCV 93.3 05/19/2021 1123  ? MCV 92 12/03/2018 1624  ? MCH 31.2 05/19/2021 1123  ? MCHC 33.5 05/19/2021 1123  ? RDW 15.9 (H) 05/19/2021 1123  ? RDW 12.3 12/03/2018 1624  ? LYMPHSABS 0.9 05/19/2021 1123  ? MONOABS 0.3 05/19/2021 1123  ? EOSABS 0.0 05/19/2021 1123  ? BASOSABS 0.0 05/19/2021 1123  ? ? ?CMP  ?   ?Component Value Date/Time  ? NA 139 05/19/2021 1123  ? NA 143 12/03/2018 1624  ? K 4.0  05/19/2021 1123  ? CL 104 05/19/2021 1123  ? CO2 26 05/19/2021 1123  ? GLUCOSE 148 (H) 05/19/2021 1123  ? BUN 21 05/19/2021 1123  ? BUN 17 12/03/2018 1624  ? CREATININE 0.67 05/19/2021 1123  ? CALCIUM 9.2 05/19/2021 11

## 2021-05-19 NOTE — Progress Notes (Signed)
Per Dr Chryl Heck okay to treat today with ALT 94 U/L ?

## 2021-05-19 NOTE — Assessment & Plan Note (Signed)
Grade 1, less than 5 bowel movements a day, okay to use Imodium as needed ?

## 2021-05-19 NOTE — Assessment & Plan Note (Signed)
She had severe sternal pain likely related to the G-CSF.  She used about 4 tablets of oxycodone and 1 day because of severe pain. ?

## 2021-05-19 NOTE — Assessment & Plan Note (Addendum)
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  We recommended neoadjuvant chemotherapy with TCHP and she is here after cycle 1 day 1 of TCHP. ?I dose reduced docetaxel and carboplatin given adverse effects for the first cycle.  She had severe skin rash from the taxane, 2 days of explosive diarrhea, urinary tract infection after the first cycle.  ?She tolerated the second cycle very well.  Okay to proceed with the same dose as cycle 2.  She also had significant response in the right breast tumor, the mass is nearly 50% smaller.  Return to clinic in 3 weeks. ? ?

## 2021-05-21 ENCOUNTER — Inpatient Hospital Stay: Payer: No Typology Code available for payment source

## 2021-05-21 ENCOUNTER — Other Ambulatory Visit: Payer: Self-pay

## 2021-05-21 ENCOUNTER — Ambulatory Visit: Payer: No Typology Code available for payment source

## 2021-05-21 VITALS — BP 138/75 | HR 87 | Temp 97.8°F | Resp 19

## 2021-05-21 DIAGNOSIS — C50411 Malignant neoplasm of upper-outer quadrant of right female breast: Secondary | ICD-10-CM

## 2021-05-21 DIAGNOSIS — Z95828 Presence of other vascular implants and grafts: Secondary | ICD-10-CM

## 2021-05-21 DIAGNOSIS — Z5111 Encounter for antineoplastic chemotherapy: Secondary | ICD-10-CM | POA: Diagnosis not present

## 2021-05-21 MED ORDER — SODIUM CHLORIDE 0.9% FLUSH
10.0000 mL | Freq: Once | INTRAVENOUS | Status: AC | PRN
  Administered 2021-05-21: 10 mL

## 2021-05-21 MED ORDER — PEGFILGRASTIM-CBQV 6 MG/0.6ML ~~LOC~~ SOSY
6.0000 mg | PREFILLED_SYRINGE | Freq: Once | SUBCUTANEOUS | Status: AC
  Administered 2021-05-21: 6 mg via SUBCUTANEOUS

## 2021-05-21 MED ORDER — HEPARIN SOD (PORK) LOCK FLUSH 100 UNIT/ML IV SOLN
500.0000 [IU] | Freq: Once | INTRAVENOUS | Status: AC | PRN
  Administered 2021-05-21: 500 [IU]

## 2021-05-21 MED ORDER — SODIUM CHLORIDE 0.9 % IV SOLN: Freq: Once | INTRAVENOUS | Status: AC

## 2021-05-21 NOTE — Patient Instructions (Signed)

## 2021-05-24 ENCOUNTER — Encounter: Payer: Self-pay | Admitting: Adult Health

## 2021-05-24 ENCOUNTER — Encounter: Payer: Self-pay | Admitting: Hematology and Oncology

## 2021-05-24 ENCOUNTER — Other Ambulatory Visit: Payer: Self-pay | Admitting: Hematology and Oncology

## 2021-05-24 NOTE — Progress Notes (Signed)
Patient's carboplatin dose changed to set dose of 520 mg (~AUC = 5) for remaining cycles per MD request. ? ?[name] [name], PharmD ?Pharmacy Resident  ?05/24/2021 ?1:02 PM ? ?

## 2021-06-07 MED FILL — Dexamethasone Sodium Phosphate Inj 100 MG/10ML: INTRAMUSCULAR | Qty: 1 | Status: AC

## 2021-06-07 MED FILL — Fosaprepitant Dimeglumine For IV Infusion 150 MG (Base Eq): INTRAVENOUS | Qty: 5 | Status: AC

## 2021-06-08 ENCOUNTER — Other Ambulatory Visit: Payer: Self-pay

## 2021-06-08 ENCOUNTER — Inpatient Hospital Stay: Payer: No Typology Code available for payment source | Attending: Hematology and Oncology

## 2021-06-08 ENCOUNTER — Encounter: Payer: Self-pay | Admitting: Hematology and Oncology

## 2021-06-08 ENCOUNTER — Inpatient Hospital Stay (HOSPITAL_BASED_OUTPATIENT_CLINIC_OR_DEPARTMENT_OTHER): Payer: No Typology Code available for payment source | Admitting: Hematology and Oncology

## 2021-06-08 ENCOUNTER — Inpatient Hospital Stay: Payer: No Typology Code available for payment source

## 2021-06-08 DIAGNOSIS — M898X9 Other specified disorders of bone, unspecified site: Secondary | ICD-10-CM

## 2021-06-08 DIAGNOSIS — Z5112 Encounter for antineoplastic immunotherapy: Secondary | ICD-10-CM | POA: Diagnosis not present

## 2021-06-08 DIAGNOSIS — Z170 Estrogen receptor positive status [ER+]: Secondary | ICD-10-CM | POA: Diagnosis not present

## 2021-06-08 DIAGNOSIS — C50411 Malignant neoplasm of upper-outer quadrant of right female breast: Secondary | ICD-10-CM | POA: Insufficient documentation

## 2021-06-08 DIAGNOSIS — Z5111 Encounter for antineoplastic chemotherapy: Secondary | ICD-10-CM | POA: Insufficient documentation

## 2021-06-08 DIAGNOSIS — T451X5A Adverse effect of antineoplastic and immunosuppressive drugs, initial encounter: Secondary | ICD-10-CM

## 2021-06-08 DIAGNOSIS — Z95828 Presence of other vascular implants and grafts: Secondary | ICD-10-CM

## 2021-06-08 DIAGNOSIS — K521 Toxic gastroenteritis and colitis: Secondary | ICD-10-CM | POA: Diagnosis not present

## 2021-06-08 DIAGNOSIS — R300 Dysuria: Secondary | ICD-10-CM

## 2021-06-08 DIAGNOSIS — Z5189 Encounter for other specified aftercare: Secondary | ICD-10-CM | POA: Diagnosis not present

## 2021-06-08 LAB — CBC WITH DIFFERENTIAL (CANCER CENTER ONLY)
Abs Immature Granulocytes: 0.01 10*3/uL (ref 0.00–0.07)
Basophils Absolute: 0 10*3/uL (ref 0.0–0.1)
Basophils Relative: 0 %
Eosinophils Absolute: 0 10*3/uL (ref 0.0–0.5)
Eosinophils Relative: 0 %
HCT: 28.2 % — ABNORMAL LOW (ref 36.0–46.0)
Hemoglobin: 9.4 g/dL — ABNORMAL LOW (ref 12.0–15.0)
Immature Granulocytes: 0 %
Lymphocytes Relative: 13 %
Lymphs Abs: 0.4 10*3/uL — ABNORMAL LOW (ref 0.7–4.0)
MCH: 31.8 pg (ref 26.0–34.0)
MCHC: 33.3 g/dL (ref 30.0–36.0)
MCV: 95.3 fL (ref 80.0–100.0)
Monocytes Absolute: 0.2 10*3/uL (ref 0.1–1.0)
Monocytes Relative: 6 %
Neutro Abs: 2.7 10*3/uL (ref 1.7–7.7)
Neutrophils Relative %: 81 %
Platelet Count: 207 10*3/uL (ref 150–400)
RBC: 2.96 MIL/uL — ABNORMAL LOW (ref 3.87–5.11)
RDW: 17 % — ABNORMAL HIGH (ref 11.5–15.5)
WBC Count: 3.4 10*3/uL — ABNORMAL LOW (ref 4.0–10.5)
nRBC: 0 % (ref 0.0–0.2)

## 2021-06-08 LAB — CMP (CANCER CENTER ONLY)
ALT: 21 U/L (ref 0–44)
AST: 15 U/L (ref 15–41)
Albumin: 3.9 g/dL (ref 3.5–5.0)
Alkaline Phosphatase: 91 U/L (ref 38–126)
Anion gap: 8 (ref 5–15)
BUN: 16 mg/dL (ref 8–23)
CO2: 25 mmol/L (ref 22–32)
Calcium: 9.2 mg/dL (ref 8.9–10.3)
Chloride: 107 mmol/L (ref 98–111)
Creatinine: 0.62 mg/dL (ref 0.44–1.00)
GFR, Estimated: >60 mL/min (ref >60)
Glucose, Bld: 149 mg/dL — ABNORMAL HIGH (ref 70–99)
Potassium: 3.9 mmol/L (ref 3.5–5.1)
Sodium: 140 mmol/L (ref 135–145)
Total Bilirubin: 0.4 mg/dL (ref 0.3–1.2)
Total Protein: 6.3 g/dL — ABNORMAL LOW (ref 6.5–8.1)

## 2021-06-08 MED ORDER — SODIUM CHLORIDE 0.9% FLUSH
10.0000 mL | INTRAVENOUS | Status: DC | PRN
  Administered 2021-06-08: 10 mL

## 2021-06-08 MED ORDER — SODIUM CHLORIDE 0.9 % IV SOLN
60.0000 mg/m2 | Freq: Once | INTRAVENOUS | Status: AC
  Administered 2021-06-08: 100 mg via INTRAVENOUS
  Filled 2021-06-08: qty 10

## 2021-06-08 MED ORDER — DIPHENHYDRAMINE HCL 25 MG PO CAPS
50.0000 mg | ORAL_CAPSULE | Freq: Once | ORAL | Status: AC
  Administered 2021-06-08: 50 mg via ORAL
  Filled 2021-06-08: qty 2

## 2021-06-08 MED ORDER — TRASTUZUMAB-DKST CHEMO 150 MG IV SOLR
6.0000 mg/kg | Freq: Once | INTRAVENOUS | Status: AC
  Administered 2021-06-08: 399 mg via INTRAVENOUS
  Filled 2021-06-08: qty 19

## 2021-06-08 MED ORDER — SODIUM CHLORIDE 0.9 % IV SOLN: Freq: Once | INTRAVENOUS | Status: AC

## 2021-06-08 MED ORDER — CIPROFLOXACIN HCL 500 MG PO TABS: 500.0000 mg | ORAL_TABLET | Freq: Two times a day (BID) | ORAL | 0 refills | Status: DC

## 2021-06-08 MED ORDER — ACETAMINOPHEN 325 MG PO TABS
650.0000 mg | ORAL_TABLET | Freq: Once | ORAL | Status: AC
  Administered 2021-06-08: 650 mg via ORAL
  Filled 2021-06-08: qty 2

## 2021-06-08 MED ORDER — SODIUM CHLORIDE 0.9 % IV SOLN
520.0000 mg | Freq: Once | INTRAVENOUS | Status: AC
  Administered 2021-06-08: 520 mg via INTRAVENOUS
  Filled 2021-06-08: qty 52

## 2021-06-08 MED ORDER — SODIUM CHLORIDE 0.9% FLUSH
10.0000 mL | Freq: Once | INTRAVENOUS | Status: AC
  Administered 2021-06-08: 10 mL

## 2021-06-08 MED ORDER — HEPARIN SOD (PORK) LOCK FLUSH 100 UNIT/ML IV SOLN
500.0000 [IU] | Freq: Once | INTRAVENOUS | Status: AC | PRN
  Administered 2021-06-08: 500 [IU]

## 2021-06-08 MED ORDER — SODIUM CHLORIDE 0.9 % IV SOLN
420.0000 mg | Freq: Once | INTRAVENOUS | Status: AC
  Administered 2021-06-08: 420 mg via INTRAVENOUS
  Filled 2021-06-08: qty 14

## 2021-06-08 MED ORDER — PALONOSETRON HCL INJECTION 0.25 MG/5ML
0.2500 mg | Freq: Once | INTRAVENOUS | Status: AC
  Administered 2021-06-08: 0.25 mg via INTRAVENOUS
  Filled 2021-06-08: qty 5

## 2021-06-08 MED ORDER — SODIUM CHLORIDE 0.9 % IV SOLN
10.0000 mg | Freq: Once | INTRAVENOUS | Status: AC
  Administered 2021-06-08: 10 mg via INTRAVENOUS
  Filled 2021-06-08: qty 10

## 2021-06-08 MED ORDER — FLUCONAZOLE 100 MG PO TABS: 100.0000 mg | ORAL_TABLET | Freq: Every day | ORAL | 0 refills | Status: DC

## 2021-06-08 MED ORDER — SODIUM CHLORIDE 0.9 % IV SOLN
150.0000 mg | Freq: Once | INTRAVENOUS | Status: AC
  Administered 2021-06-08: 150 mg via INTRAVENOUS
  Filled 2021-06-08: qty 150

## 2021-06-08 NOTE — Assessment & Plan Note (Signed)
Last episode of dysuria/UTI-like symptoms on 5 8.  Patient likes to keep her prescription for ciprofloxacin.  She was however encouraged to give [place] a call when those symptoms appear so we can proceed with a UA and culture ?

## 2021-06-08 NOTE — Progress Notes (Signed)
[name] [name] [name] [facility] Follow up: ?  ? ?[name], [name] [name] [name] ?[place] ?[name] [address] ? ? ?DIAGNOSIS:  Cancer Staging  ?Malignant neoplasm of upper-outer quadrant of right breast in [name] [name], [name] [name] [name] receptor positive ([place]) ?Staging form: Breast, AJCC 8th Edition ?- Clinical stage from 03/17/2021: Stage IB (cT2, cN0, cM0, G2, ER+, PR+, HER2+) - Unsigned ?Stage prefix: Initial diagnosis ?Method of lymph node assessment: Clinical ?Histologic grading system: 3 grade system ? ? ?SUMMARY OF ONCOLOGIC HISTORY: ?Oncology History  ?Malignant neoplasm of upper-outer quadrant of right breast in [name] [name], [name] [name] [name] receptor positive ([place])  ?03/16/2021 Initial Diagnosis  ? Malignant neoplasm of upper-outer quadrant of right breast in [name] [name], [name] [name] [name] receptor positive ([place]) ? ?  ?04/07/2021 -  Chemotherapy  ? Patient is on Treatment Plan : BREAST  Docetaxel + Carboplatin + Trastuzumab + Pertuzumab  (TCHP) q21d   ? ?   ? Genetic Testing  ? [facility] CustomNext Panel was Negative. Report date is 03/28/2021. ? ?The CustomNext-Cancer+RNAinsight panel offered by Althia Forts includes sequencing and rearrangement analysis for the following 47 genes:  APC, ATM, AXIN2, BARD1, BMPR1A, BRCA1, BRCA2, BRIP1, CDH1, CDK4, CDKN2A, CHEK2, CTNNA1, DICER1, EPCAM, GREM1, HOXB13, KIT, MEN1, MLH1, MSH2, MSH3, MSH6, MUTYH, NBN, NF1, NTHL1, PALB2, PDGFRA, PMS2, POLD1, POLE, PTEN, RAD50, RAD51C, RAD51D, SDHA, SDHB, SDHC, SDHD, SMAD4, SMARCA4, STK11, TP53, TSC1, TSC2, and VHL.  RNA data is routinely analyzed for use in variant interpretation for all genes. ?  ? ? ?CURRENT THERAPY: TCHP  ? ?INTERVAL HISTORY: ?[name] [name] [name] [name] [name] 61 y.o. [name] [name] returns for evaluation before receiving cycle 4 of TCHP.  ?Since last visit, she had mild diarrhea which was controlled with Imodium.  She had urinary tract like symptoms on 5 8 and took some ciprofloxacin.  She tells me that she would like to have a stash of this if possible.  She also had some  yeastlike symptoms and wants to have a prescription for fluconazole if needed.   ?After the growth factor, she had to lay on the couch for almost 3 days but then felt like her energy returned.   ?She has since then stayed active.  No more skin rash. ?She states that the breast mass in the right breast is barely palpable. ?Rest of the pertinent 10 point ROS reviewed and negative. ? ?Patient Active Problem List  ? Diagnosis Date Noted  ? Dysuria 06/08/2021  ? Port-A-Cath in place 05/18/2021  ? Chemotherapy induced diarrhea 04/27/2021  ? Rash and nonspecific skin eruption 04/27/2021  ? UTI (urinary tract infection) 04/27/2021  ? 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 Transaminitis 04/27/2021  ? Genetic testing 03/31/2021  ? Malignant neoplasm of upper-outer quadrant of right breast in [name] [name], [name] [name] [name] receptor positive ([place]) 03/16/2021  ? History of cervical dysplasia 02/17/2021  ? Hot flashes due to menopause 09/16/2019  ? Typical atrial flutter ([place]) 01/01/2019  ? ? ?is allergic to bee venom. ? ?MEDICAL HISTORY: ?Past Medical History:  ?Diagnosis Date  ? Abnormal bleeding in menstrual cycle   ? Abnormal Pap smear of vagina   ? Breast cancer ([place])   ? COVID 08/2019  ? Endometrial polyp   ? Hot flashes   ? Irregular heart beats   ? PONV (postoperative nausea and vomiting)   ? Typical atrial flutter ([place]) 01/01/2019  ? ? ?SURGICAL HISTORY: ?Past Surgical History:  ?Procedure Laterality Date  ? BREAST BIOPSY Right 04/05/2021  ? BREAST IMPLANT REMOVAL Bilateral 2021  ? breast  implants Right 2002  ? CHOLECYSTECTOMY    ? PORTACATH PLACEMENT N/A 04/06/2021  ? Procedure: INSERTION PORT-A-CATH;  Surgeon: Stark Klein, [name] [name] [name];  Location: [facility];  Service: General;  Laterality: N/A;  ? SHOULDER SURGERY    ? Rotator cuff repair  ? ? ?SOCIAL HISTORY: ?Social History  ? ?Socioeconomic History  ? Marital status: Married  ?  Spouse name: Not on file  ? Number of children: Not on file  ? Years of education:  Not on file  ? Highest education level: Not on file  ?Occupational History  ? Not on file  ?Tobacco Use  ? Smoking status: Never  ? Smokeless tobacco: Never  ?Vaping Use  ? Vaping Use: Never used  ?Substance and Sexual Activity  ? Alcohol use: Not Currently  ?  Alcohol/week: 14.0 standard drinks  ?  Types: 14 Cans of beer per week  ? Drug use: Not Currently  ? Sexual activity: Not on file  ?Other Topics Concern  ? Not on file  ?Social History Narrative  ? Not on file  ? ?Social Determinants of Health  ? ?Financial Resource Strain: Not on file  ?Food Insecurity: Not on file  ?Transportation Needs: Not on file  ?Physical Activity: Not on file  ?Stress: Not on file  ?Social Connections: Not on file  ?Intimate Partner Violence: Not on file  ? ? ?FAMILY HISTORY: ?Family History  ?Problem Relation Age of Onset  ? Heart attack Mother   ? ? ?Review of Systems  ?Constitutional:  Positive for fatigue. Negative for appetite change, chills, fever and unexpected weight change.  ?HENT:   Negative for hearing loss, lump/mass, mouth sores and trouble swallowing.   ?Eyes:  Negative for eye problems and icterus.  ?Respiratory:  Negative for chest tightness, cough and shortness of breath.   ?Cardiovascular:  Negative for chest pain, leg swelling and palpitations.  ?Gastrointestinal:  Positive for diarrhea (mild diarrhea). Negative for abdominal distention, abdominal pain, constipation, nausea and vomiting.  ?Endocrine: Negative for hot flashes.  ?Genitourinary:  Negative for difficulty urinating and dysuria (Resolved).   ?Musculoskeletal:  Negative for arthralgias.  ?Skin:  Negative for itching and rash.  ?Neurological:  Negative for dizziness, extremity weakness, headaches and numbness.  ?Hematological:  Negative for adenopathy. Does not bruise/bleed easily.  ?Psychiatric/Behavioral:  Negative for depression. The patient is not nervous/anxious.    ? ? ?PHYSICAL EXAMINATION ? ?ECOG PERFORMANCE STATUS: 1 - Symptomatic but completely  ambulatory ? ?Vitals:  ? 06/08/21 0908  ?BP: (!) 159/80  ?Pulse: 94  ?Resp: 18  ?Temp: 98.1 ?F (36.7 ?C)  ?SpO2: 100%  ? ? ? ?Physical Exam ?Constitutional:   ?   General: She is not in acute distress. ?   Appearance: Normal appearance. She is not toxic-appearing.  ?HENT:  ?   Head: Normocephalic and atraumatic.  ?Eyes:  ?   General: No scleral icterus. ?Cardiovascular:  ?   Rate and Rhythm: Normal rate and regular rhythm.  ?   Pulses: Normal pulses.  ?   Heart sounds: Normal heart sounds.  ?Pulmonary:  ?   Effort: Pulmonary effort is normal.  ?   Breath sounds: Normal breath sounds.  ?Chest:  ?   Comments: Right breast mass at 9 o'clock position not palpable on exam today.  However there is a small area of indentation in that location.  There are some density changes, this could be residual mass versus scar tissue.  No palpable axillary lymphadenopathy on the right ?Abdominal:  ?  General: Abdomen is flat. Bowel sounds are normal. There is no distension.  ?   Palpations: Abdomen is soft.  ?   Tenderness: There is no abdominal tenderness.  ?Musculoskeletal:     ?   General: No swelling.  ?   Cervical back: Neck supple.  ?Lymphadenopathy:  ?   Cervical: No cervical adenopathy.  ?Skin: ?   General: Skin is warm and dry.  ?   Findings: No rash (No rash).  ?Neurological:  ?   General: No focal deficit present.  ?   Mental Status: She is alert.  ?Psychiatric:     ?   Mood and Affect: Mood normal.     ?   Behavior: Behavior normal.  ? ? ?LABORATORY DATA: ? ?CBC ?   ?Component Value Date/Time  ? WBC 4.4 05/19/2021 1123  ? WBC 10.8 (H) 05/10/2021 1720  ? RBC 2.69 (L) 05/19/2021 1123  ? HGB 8.4 (L) 05/19/2021 1123  ? HGB 12.9 12/03/2018 1624  ? HCT 25.1 (L) 05/19/2021 1123  ? HCT 38.5 12/03/2018 1624  ? PLT 454 (H) 05/19/2021 1123  ? PLT 255 12/03/2018 1624  ? MCV 93.3 05/19/2021 1123  ? MCV 92 12/03/2018 1624  ? MCH 31.2 05/19/2021 1123  ? MCHC 33.5 05/19/2021 1123  ? RDW 15.9 (H) 05/19/2021 1123  ? RDW 12.3 12/03/2018  1624  ? LYMPHSABS 0.9 05/19/2021 1123  ? MONOABS 0.3 05/19/2021 1123  ? EOSABS 0.0 05/19/2021 1123  ? BASOSABS 0.0 05/19/2021 1123  ? ? ?CMP  ?   ?Component Value Date/Time  ? NA 139 05/19/2021 1123  ? NA 14

## 2021-06-08 NOTE — Assessment & Plan Note (Signed)
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  We recommended neoadjuvant chemotherapy with TCHP and she is here after cycle 3 of TCHP. ?We did dose reduction after cycle 1 of TCHP given explosive diarrhea, severe skin rash and since then she has tolerated this well.  Okay to proceed with chemotherapy today if labs are all within parameters.  She has had a great clinical response so far in the right breast mass with no palpable mass on exam today.  No further dose reduction needed ?

## 2021-06-08 NOTE — Progress Notes (Signed)
Per Dr. Chryl Heck, ok to proceed with premeds before CMP results are back. ?

## 2021-06-08 NOTE — Assessment & Plan Note (Signed)
Grade 1, continue to use Imodium ?

## 2021-06-08 NOTE — Assessment & Plan Note (Signed)
She had severe sternal pain likely related to the G-CSF.  She used about 4 tablets of oxycodone because of severe pain.  After her last G-CSF shot, she had dizziness and she felt horrible so she had to lay on the couch for almost 3 days but since then has recovered. ?

## 2021-06-10 ENCOUNTER — Ambulatory Visit: Payer: No Typology Code available for payment source

## 2021-06-10 ENCOUNTER — Inpatient Hospital Stay: Payer: No Typology Code available for payment source

## 2021-06-10 ENCOUNTER — Other Ambulatory Visit: Payer: Self-pay

## 2021-06-10 VITALS — BP 146/89 | HR 73 | Temp 97.8°F | Resp 18

## 2021-06-10 DIAGNOSIS — Z95828 Presence of other vascular implants and grafts: Secondary | ICD-10-CM

## 2021-06-10 DIAGNOSIS — Z5111 Encounter for antineoplastic chemotherapy: Secondary | ICD-10-CM | POA: Diagnosis not present

## 2021-06-10 DIAGNOSIS — Z170 Estrogen receptor positive status [ER+]: Secondary | ICD-10-CM

## 2021-06-10 MED ORDER — SODIUM CHLORIDE 0.9% FLUSH
10.0000 mL | Freq: Once | INTRAVENOUS | Status: AC | PRN
  Administered 2021-06-10: 10 mL

## 2021-06-10 MED ORDER — PEGFILGRASTIM-CBQV 6 MG/0.6ML ~~LOC~~ SOSY
6.0000 mg | PREFILLED_SYRINGE | Freq: Once | SUBCUTANEOUS | Status: AC
  Administered 2021-06-10: 6 mg via SUBCUTANEOUS
  Filled 2021-06-10: qty 0.6

## 2021-06-10 MED ORDER — SODIUM CHLORIDE 0.9 % IV SOLN: Freq: Once | INTRAVENOUS | Status: AC

## 2021-06-10 MED ORDER — HEPARIN SOD (PORK) LOCK FLUSH 100 UNIT/ML IV SOLN
500.0000 [IU] | Freq: Once | INTRAVENOUS | Status: AC | PRN
  Administered 2021-06-10: 500 [IU]

## 2021-06-10 NOTE — Patient Instructions (Signed)
Rehydration, Adult Rehydration is the replacement of body fluids, salts, and minerals (electrolytes) that are lost during dehydration. Dehydration is when there is not enough water or other fluids in the body. This happens when you lose more fluids than you take in. Common causes of dehydration include: Not drinking enough fluids. This can occur when you are ill or doing activities that require a lot of energy, especially in hot weather. Conditions that cause loss of water or other fluids, such as diarrhea, vomiting, sweating, or urinating a lot. Other illnesses, such as fever or infection. Certain medicines, such as those that remove excess fluid from the body (diuretics). Symptoms of mild or moderate dehydration may include thirst, dry lips and mouth, and dizziness. Symptoms of severe dehydration may include increased heart rate, confusion, fainting, and not urinating. For severe dehydration, you may need to get fluids through an IV at the hospital. For mild or moderate dehydration, you can usually rehydrate at home by drinking certain fluids as told by your health care provider. What are the risks? Generally, rehydration is safe. However, taking in too much fluid (overhydration) can be a problem. This is rare. Overhydration can cause an electrolyte imbalance, kidney failure, or a decrease in salt (sodium) levels in the body. Supplies needed You will need an oral rehydration solution (ORS) if your health care provider tells you to use one. This is a drink to treat dehydration. It can be found in pharmacies and retail stores. How to rehydrate Fluids Follow instructions from your health care provider for rehydration. The kind of fluid and the amount you should drink depend on your condition. In general, you should choose drinks that you prefer. If told by your health care provider, drink an ORS. Make an ORS by following instructions on the package. Start by drinking small amounts, about  cup (120  mL) every 5-10 minutes. Slowly increase how much you drink until you have taken the amount recommended by your health care provider. Drink enough clear fluids to keep your urine pale yellow. If you were told to drink an ORS, finish it first, then start slowly drinking other clear fluids. Drink fluids such as: Water. This includes sparkling water and flavored water. Drinking only water can lead to having too little sodium in your body (hyponatremia). Follow the advice of your health care provider. Water from ice chips you suck on. Fruit juice with water you add to it (diluted). Sports drinks. Hot or cold herbal teas. Broth-based soups. Milk or milk products. Food Follow instructions from your health care provider about what to eat while you rehydrate. Your health care provider may recommend that you slowly begin eating regular foods in small amounts. Eat foods that contain a healthy balance of electrolytes, such as bananas, oranges, potatoes, tomatoes, and spinach. Avoid foods that are greasy or contain a lot of sugar. In some cases, you may get nutrition through a feeding tube that is passed through your nose and into your stomach (nasogastric tube, or NG tube). This may be done if you have uncontrolled vomiting or diarrhea. Beverages to avoid  Certain beverages may make dehydration worse. While you rehydrate, avoid drinking alcohol. How to tell if you are recovering from dehydration You may be recovering from dehydration if: You are urinating more often than before you started rehydrating. Your urine is pale yellow. Your energy level improves. You vomit less frequently. You have diarrhea less frequently. Your appetite improves or returns to normal. You feel less dizzy or less light-headed.   Your skin tone and color start to look more normal. Follow these instructions at home: Take over-the-counter and prescription medicines only as told by your health care provider. Do not take sodium  tablets. Doing this can lead to having too much sodium in your body (hypernatremia). Contact a health care provider if: You continue to have symptoms of mild or moderate dehydration, such as: Thirst. Dry lips. Slightly dry mouth. Dizziness. Dark urine or less urine than normal. Muscle cramps. You continue to vomit or have diarrhea. Get help right away if you: Have symptoms of dehydration that get worse. Have a fever. Have a severe headache. Have been vomiting and the following happens: Your vomiting gets worse or does not go away. Your vomit includes blood or green matter (bile). You cannot eat or drink without vomiting. Have problems with urination or bowel movements, such as: Diarrhea that gets worse or does not go away. Blood in your stool (feces). This may cause stool to look black and tarry. Not urinating, or urinating only a small amount of very dark urine, within 6-8 hours. Have trouble breathing. Have symptoms that get worse with treatment. These symptoms may represent a serious problem that is an emergency. Do not wait to see if the symptoms will go away. Get medical help right away. Call your local emergency services (911 in the [place] Do not drive yourself to the hospital. Summary Rehydration is the replacement of body fluids and minerals (electrolytes) that are lost during dehydration. Follow instructions from your health care provider for rehydration. The kind of fluid and amount you should drink depend on your condition. Slowly increase how much you drink until you have taken the amount recommended by your health care provider. Contact your health care provider if you continue to show signs of mild or moderate dehydration. This information is not intended to replace advice given to you by your health care provider. Make sure you discuss any questions you have with your health care provider. Document Revised: 03/13/2019 Document Reviewed: 01/21/2019 Elsevier Patient  Education  [place].  Pegfilgrastim Injection What is this medication? PEGFILGRASTIM (PEG fil gra stim) lowers the risk of infection in people who are receiving chemotherapy. It works by Building control surveyor make more white blood cells, which protects your body from infection. It may also be used to help people who have been exposed to high doses of radiation. This medicine may be used for other purposes; ask your health care provider or pharmacist if you have questions. COMMON BRAND NAME(S): [employer] [employer], Neulasta, Nyvepria, Stimufend, UDENYCA, Ziextenzo What should I tell my care team before I take this medication? They need to know if you have any of these conditions: Kidney disease Latex allergy Ongoing radiation therapy Sickle cell disease Skin reactions to acrylic adhesives (On-Body Injector only) An unusual or allergic reaction to pegfilgrastim, filgrastim, other medications, foods, dyes, or preservatives Pregnant or trying to get pregnant Breast-feeding How should I use this medication? This medication is for injection under the skin. If you get this medication at home, you will be taught how to prepare and give the pre-filled syringe or how to use the On-body Injector. Refer to the patient Instructions for Use for detailed instructions. Use exactly as directed. Tell your care team immediately if you suspect that the On-body Injector may not have performed as intended or if you suspect the use of the On-body Injector resulted in a missed or partial dose. It is important that you put your used needles  and syringes in a special sharps container. Do not put them in a trash can. If you do not have a sharps container, call your pharmacist or care team to get one. Talk to your care team about the use of this medication in children. While this medication may be prescribed for selected conditions, precautions do apply. Overdosage: If you think you have taken too much of this  medicine contact a poison control center or emergency room at once. NOTE: This medicine is only for you. Do not share this medicine with others. What if I miss a dose? It is important not to miss your dose. Call your care team if you miss your dose. If you miss a dose due to an On-body Injector failure or leakage, a new dose should be administered as soon as possible using a single prefilled syringe for manual use. What may interact with this medication? Interactions have not been studied. This list may not describe all possible interactions. Give your health care provider a list of all the medicines, herbs, non-prescription drugs, or dietary supplements you use. Also tell them if you smoke, drink alcohol, or use illegal drugs. Some items may interact with your medicine. What should I watch for while using this medication? Your condition will be monitored carefully while you are receiving this medication. You may need blood work done while you are taking this medication. Talk to your care team about your risk of cancer. You may be more at risk for certain types of cancer if you take this medication. If you are going to need a MRI, CT scan, or other procedure, tell your care team that you are using this medication (On-Body Injector only). What side effects may I notice from receiving this medication? Side effects that you should report to your care team as soon as possible: Allergic reactions--skin rash, itching, hives, swelling of the face, lips, tongue, or throat Capillary leak syndrome--stomach or muscle pain, unusual weakness or fatigue, feeling faint or lightheaded, decrease in the amount of urine, swelling of the ankles, hands, or feet, trouble breathing High white blood cell level--fever, fatigue, trouble breathing, night sweats, change in vision, weight loss Inflammation of the aorta--fever, fatigue, back, chest, or stomach pain, severe headache Kidney injury (glomerulonephritis)--decrease  in the amount of urine, red or dark brown urine, foamy or bubbly urine, swelling of the ankles, hands, or feet Shortness of breath or trouble breathing Spleen injury--pain in upper left stomach or shoulder Unusual bruising or bleeding Side effects that usually do not require medical attention (report to your care team if they continue or are bothersome): Bone pain Pain in the hands or feet This list may not describe all possible side effects. Call your doctor for medical advice about side effects. You may report side effects to FDA at 1-800-FDA-1088. Where should I keep my medication? Keep out of the reach of children. If you are using this medication at home, you will be instructed on how to store it. Throw away any unused medication after the expiration date on the label. NOTE: This sheet is a summary. It may not cover all possible information. If you have questions about this medicine, talk to your doctor, pharmacist, or health care provider.  2023 Elsevier/Gold Standard (2020-12-11 00:00:00)

## 2021-06-24 ENCOUNTER — Encounter: Payer: Self-pay | Admitting: *Deleted

## 2021-06-29 MED FILL — Dexamethasone Sodium Phosphate Inj 100 MG/10ML: INTRAMUSCULAR | Qty: 1 | Status: AC

## 2021-06-29 MED FILL — Fosaprepitant Dimeglumine For IV Infusion 150 MG (Base Eq): INTRAVENOUS | Qty: 5 | Status: AC

## 2021-06-30 ENCOUNTER — Other Ambulatory Visit: Payer: Self-pay

## 2021-06-30 ENCOUNTER — Inpatient Hospital Stay: Payer: No Typology Code available for payment source | Attending: Hematology and Oncology

## 2021-06-30 ENCOUNTER — Encounter: Payer: Self-pay | Admitting: Adult Health

## 2021-06-30 ENCOUNTER — Inpatient Hospital Stay (HOSPITAL_BASED_OUTPATIENT_CLINIC_OR_DEPARTMENT_OTHER): Payer: No Typology Code available for payment source | Admitting: Adult Health

## 2021-06-30 ENCOUNTER — Inpatient Hospital Stay: Payer: No Typology Code available for payment source

## 2021-06-30 VITALS — BP 145/82 | HR 90 | Temp 97.3°F | Resp 18 | Ht 62.0 in | Wt 147.3 lb

## 2021-06-30 DIAGNOSIS — Z5189 Encounter for other specified aftercare: Secondary | ICD-10-CM | POA: Diagnosis not present

## 2021-06-30 DIAGNOSIS — Z5111 Encounter for antineoplastic chemotherapy: Secondary | ICD-10-CM | POA: Insufficient documentation

## 2021-06-30 DIAGNOSIS — Z170 Estrogen receptor positive status [ER+]: Secondary | ICD-10-CM

## 2021-06-30 DIAGNOSIS — C50411 Malignant neoplasm of upper-outer quadrant of right female breast: Secondary | ICD-10-CM | POA: Diagnosis not present

## 2021-06-30 DIAGNOSIS — Z5112 Encounter for antineoplastic immunotherapy: Secondary | ICD-10-CM | POA: Insufficient documentation

## 2021-06-30 LAB — CMP (CANCER CENTER ONLY)
ALT: 36 U/L (ref 0–44)
AST: 19 U/L (ref 15–41)
Albumin: 4.2 g/dL (ref 3.5–5.0)
Alkaline Phosphatase: 81 U/L (ref 38–126)
Anion gap: 10 (ref 5–15)
BUN: 18 mg/dL (ref 8–23)
CO2: 23 mmol/L (ref 22–32)
Calcium: 9.8 mg/dL (ref 8.9–10.3)
Chloride: 108 mmol/L (ref 98–111)
Creatinine: 0.77 mg/dL (ref 0.44–1.00)
GFR, Estimated: >60 mL/min (ref >60)
Glucose, Bld: 175 mg/dL — ABNORMAL HIGH (ref 70–99)
Potassium: 3.6 mmol/L (ref 3.5–5.1)
Sodium: 141 mmol/L (ref 135–145)
Total Bilirubin: 0.4 mg/dL (ref 0.3–1.2)
Total Protein: 6.7 g/dL (ref 6.5–8.1)

## 2021-06-30 LAB — CBC WITH DIFFERENTIAL (CANCER CENTER ONLY)
Abs Immature Granulocytes: 0.03 10*3/uL (ref 0.00–0.07)
Basophils Absolute: 0 10*3/uL (ref 0.0–0.1)
Basophils Relative: 0 %
Eosinophils Absolute: 0 10*3/uL (ref 0.0–0.5)
Eosinophils Relative: 0 %
HCT: 30.7 % — ABNORMAL LOW (ref 36.0–46.0)
Hemoglobin: 10.2 g/dL — ABNORMAL LOW (ref 12.0–15.0)
Immature Granulocytes: 1 %
Lymphocytes Relative: 8 %
Lymphs Abs: 0.4 10*3/uL — ABNORMAL LOW (ref 0.7–4.0)
MCH: 32.5 pg (ref 26.0–34.0)
MCHC: 33.2 g/dL (ref 30.0–36.0)
MCV: 97.8 fL (ref 80.0–100.0)
Monocytes Absolute: 0.3 10*3/uL (ref 0.1–1.0)
Monocytes Relative: 5 %
Neutro Abs: 4.6 10*3/uL (ref 1.7–7.7)
Neutrophils Relative %: 86 %
Platelet Count: 277 10*3/uL (ref 150–400)
RBC: 3.14 MIL/uL — ABNORMAL LOW (ref 3.87–5.11)
RDW: 17.2 % — ABNORMAL HIGH (ref 11.5–15.5)
WBC Count: 5.3 10*3/uL (ref 4.0–10.5)
nRBC: 0 % (ref 0.0–0.2)

## 2021-06-30 MED ORDER — SODIUM CHLORIDE 0.9 % IV SOLN
10.0000 mg | Freq: Once | INTRAVENOUS | Status: AC
  Administered 2021-06-30: 10 mg via INTRAVENOUS
  Filled 2021-06-30: qty 10

## 2021-06-30 MED ORDER — SODIUM CHLORIDE 0.9 % IV SOLN
150.0000 mg | Freq: Once | INTRAVENOUS | Status: AC
  Administered 2021-06-30: 150 mg via INTRAVENOUS
  Filled 2021-06-30: qty 150

## 2021-06-30 MED ORDER — SODIUM CHLORIDE 0.9 % IV SOLN: Freq: Once | INTRAVENOUS | Status: AC

## 2021-06-30 MED ORDER — SODIUM CHLORIDE 0.9 % IV SOLN
60.0000 mg/m2 | Freq: Once | INTRAVENOUS | Status: AC
  Administered 2021-06-30: 100 mg via INTRAVENOUS
  Filled 2021-06-30: qty 10

## 2021-06-30 MED ORDER — SODIUM CHLORIDE 0.9% FLUSH
10.0000 mL | INTRAVENOUS | Status: DC | PRN
  Administered 2021-06-30: 10 mL

## 2021-06-30 MED ORDER — DIPHENOXYLATE-ATROPINE 2.5-0.025 MG PO TABS: 1.0000 | ORAL_TABLET | Freq: Four times a day (QID) | ORAL | 0 refills | Status: DC | PRN

## 2021-06-30 MED ORDER — SODIUM CHLORIDE 0.9 % IV SOLN
520.0000 mg | Freq: Once | INTRAVENOUS | Status: AC
  Administered 2021-06-30: 520 mg via INTRAVENOUS
  Filled 2021-06-30: qty 52

## 2021-06-30 MED ORDER — HEPARIN SOD (PORK) LOCK FLUSH 100 UNIT/ML IV SOLN
500.0000 [IU] | Freq: Once | INTRAVENOUS | Status: AC | PRN
  Administered 2021-06-30: 500 [IU]

## 2021-06-30 MED ORDER — SODIUM CHLORIDE 0.9 % IV SOLN
420.0000 mg | Freq: Once | INTRAVENOUS | Status: AC
  Administered 2021-06-30: 420 mg via INTRAVENOUS
  Filled 2021-06-30: qty 14

## 2021-06-30 MED ORDER — ACETAMINOPHEN 325 MG PO TABS
650.0000 mg | ORAL_TABLET | Freq: Once | ORAL | Status: AC
  Administered 2021-06-30: 650 mg via ORAL
  Filled 2021-06-30: qty 2

## 2021-06-30 MED ORDER — TRASTUZUMAB-DKST CHEMO 150 MG IV SOLR
6.0000 mg/kg | Freq: Once | INTRAVENOUS | Status: AC
  Administered 2021-06-30: 399 mg via INTRAVENOUS
  Filled 2021-06-30: qty 19

## 2021-06-30 MED ORDER — DIPHENHYDRAMINE HCL 25 MG PO CAPS
50.0000 mg | ORAL_CAPSULE | Freq: Once | ORAL | Status: AC
  Administered 2021-06-30: 50 mg via ORAL
  Filled 2021-06-30: qty 2

## 2021-06-30 MED ORDER — PALONOSETRON HCL INJECTION 0.25 MG/5ML
0.2500 mg | Freq: Once | INTRAVENOUS | Status: AC
  Administered 2021-06-30: 0.25 mg via INTRAVENOUS
  Filled 2021-06-30: qty 5

## 2021-06-30 NOTE — Assessment & Plan Note (Addendum)
[name] [name] is a 61 year old woman with stage Ib triple positive breast cancer undergoing neoadjuvant chemotherapy with Taxotere, carboplatin, Herceptin, Perjeta.  [name] [name] is tolerating treatment moderately well.  For the diarrhea in addition to Imodium I sent in a prescription for Lomotil in case she needs it.  I also placed orders for her post neoadjuvant MRI as well as request for follow-up for cycle 6 in 3 weeks.  I recommended continued healthy diet and exercise and we will see her back prior to her next treatment.

## 2021-06-30 NOTE — Patient Instructions (Signed)
[facility]ICAL [facility]  Discharge Instructions: Thank you for choosing [facility] to provide your oncology and hematology care.   If you have a lab appointment with the Cancer Center, please go directly to the Cancer Center and check in at the registration area.   Wear comfortable clothing and clothing appropriate for easy access to any Portacath or PICC line.   We strive to give you quality time with your provider. You may need to reschedule your appointment if you arrive late (15 or more minutes).  Arriving late affects you and other patients whose appointments are after yours.  Also, if you miss three or more appointments without notifying the office, you may be dismissed from the clinic at the provider's discretion.      For prescription refill requests, have your pharmacy contact our office and allow 72 hours for refills to be completed.    Today you received the following chemotherapy and/or immunotherapy agents Trastuzumab, pertuzumab, docetaxel, carboplatin   To help prevent nausea and vomiting after your treatment, we encourage you to take your nausea medication as directed.  BELOW ARE SYMPTOMS THAT SHOULD BE REPORTED IMMEDIATELY: *FEVER GREATER THAN 100.4 F (38 C) OR HIGHER *CHILLS OR SWEATING *NAUSEA AND VOMITING THAT IS NOT CONTROLLED WITH YOUR NAUSEA MEDICATION *UNUSUAL SHORTNESS OF BREATH *UNUSUAL BRUISING OR BLEEDING *URINARY PROBLEMS (pain or burning when urinating, or frequent urination) *BOWEL PROBLEMS (unusual diarrhea, constipation, pain near the anus) TENDERNESS IN MOUTH AND THROAT WITH OR WITHOUT PRESENCE OF ULCERS (sore throat, sores in mouth, or a toothache) UNUSUAL RASH, SWELLING OR PAIN  UNUSUAL VAGINAL DISCHARGE OR ITCHING   Items with * indicate a potential emergency and should be followed up as soon as possible or go to the Emergency Department if any problems should occur.  Please show the CHEMOTHERAPY ALERT CARD or  IMMUNOTHERAPY ALERT CARD at check-in to the Emergency Department and triage nurse.  Should you have questions after your visit or need to cancel or reschedule your appointment, please contact [facility]  Dept: [phone]  and follow the prompts.  Office hours are 8:00 a.m. to 4:30 p.m. Monday - Friday. Please note that voicemails left after 4:00 p.m. may not be returned until the following business day.  We are closed weekends and major holidays. You have access to a nurse at all times for urgent questions. Please call the main number to the clinic Dept: [phone] and follow the prompts.   For any non-urgent questions, you may also contact your provider using MyChart. We now offer e-Visits for anyone 18 and older to request care online for non-urgent symptoms. For details visit [email or web address].   Also download the MyChart app! Go to the app store, search "MyChart", open the app, select [facility], and log in with your MyChart username and password.  Due to Covid, a mask is required upon entering the hospital/clinic. If you do not have a mask, one will be given to you upon arrival. For doctor visits, patients may have 1 support person aged 18 or older with them. For treatment visits, patients cannot have anyone with them due to current Covid guidelines and our immunocompromised population.  

## 2021-06-30 NOTE — Progress Notes (Signed)
[facility] Follow up:    [name], [place] [address]   DIAGNOSIS:  Cancer Staging  Malignant neoplasm of upper-outer quadrant of right breast in female, estrogen receptor positive ([place]) Staging form: Breast, AJCC 8th Edition - Clinical stage from 03/17/2021: Stage IB (cT2, cN0, cM0, G2, ER+, PR+, HER2+) - Unsigned Stage prefix: Initial diagnosis Method of lymph node assessment: Clinical Histologic grading system: 3 grade system   SUMMARY OF ONCOLOGIC HISTORY: Oncology History  Malignant neoplasm of upper-outer quadrant of right breast in female, estrogen receptor positive ([facility])  03/16/2021 Initial Diagnosis   Malignant neoplasm of upper-outer quadrant of right breast in female, estrogen receptor positive ([facility])    04/07/2021 -  Chemotherapy   Patient is on Treatment Plan : BREAST  Docetaxel + Carboplatin + Trastuzumab + Pertuzumab  (TCHP) q21d        Genetic Testing   [facility] CustomNext Panel was Negative. Report date is 03/28/2021.  The CustomNext-Cancer+RNAinsight panel offered by [facility] includes sequencing and rearrangement analysis for the following 47 genes:  APC, ATM, AXIN2, BARD1, BMPR1A, BRCA1, BRCA2, BRIP1, CDH1, CDK4, CDKN2A, CHEK2, CTNNA1, DICER1, EPCAM, GREM1, HOXB13, KIT, MEN1, MLH1, MSH2, MSH3, MSH6, MUTYH, NBN, NF1, NTHL1, PALB2, PDGFRA, PMS2, POLD1, POLE, PTEN, RAD50, RAD51C, RAD51D, SDHA, SDHB, SDHC, SDHD, SMAD4, SMARCA4, STK11, TP53, TSC1, TSC2, and VHL.  RNA data is routinely analyzed for use in variant interpretation for all genes.     CURRENT THERAPY: TCHP   INTERVAL HISTORY: [name] [name] 61 y.o. female returns for evaluation prior to receiving her fifth cycle of Taxotere, carboplatin, Herceptin Perjeta.  She is doing moderately well today.  She notes that her biggest issue since her last treatment has been a burning diarrhea.  She wants to know if there is anything that she can take to help  ameliorate this discomfort.   Patient Active Problem List   Diagnosis Date Noted   Dysuria 06/08/2021   Port-A-Cath in place 05/18/2021   Chemotherapy induced diarrhea 04/27/2021   Rash and nonspecific skin eruption 04/27/2021   UTI (urinary tract infection) 04/27/2021   Bone pain due to granulocyte colony stimulating factor 04/27/2021   Transaminitis 04/27/2021   Genetic testing 03/31/2021   Malignant neoplasm of upper-outer quadrant of right breast in female, estrogen receptor positive ([facility]) 03/16/2021   History of cervical dysplasia 02/17/2021   Hot flashes due to menopause 09/16/2019   Typical atrial flutter ([facility]) 01/01/2019    is allergic to bee venom.  MEDICAL HISTORY: Past Medical History:  Diagnosis Date   Abnormal bleeding in menstrual cycle    Abnormal Pap smear of vagina    Breast cancer ([facility])    COVID 08/2019   Endometrial polyp    Hot flashes    Irregular heart beats    PONV (postoperative nausea and vomiting)    Typical atrial flutter ([facility]) 01/01/2019    SURGICAL HISTORY: Past Surgical History:  Procedure Laterality Date   BREAST BIOPSY Right 04/05/2021   BREAST IMPLANT REMOVAL Bilateral 2021   breast implants Right 2002   CHOLECYSTECTOMY     PORTACATH PLACEMENT N/A 04/06/2021   Procedure: INSERTION PORT-A-CATH;  Surgeon: Stark Klein, MD;  Location: [facility];  Service: General;  Laterality: N/A;   SHOULDER SURGERY     Rotator cuff repair    SOCIAL HISTORY: Social History   Socioeconomic History   Marital status: Married    Spouse name: Not on file   Number of  children: Not on file   Years of education: Not on file   Highest education level: Not on file  Occupational History   Not on file  Tobacco Use   Smoking status: Never   Smokeless tobacco: Never  Vaping Use   Vaping Use: Never used  Substance and Sexual Activity   Alcohol use: Not Currently    Alcohol/week: 14.0 standard drinks    Types: 14 Cans of beer per week   Drug use: Not  Currently   Sexual activity: Not on file  Other Topics Concern   Not on file  Social History Narrative   Not on file   Social Determinants of Health   Financial Resource Strain: Not on file  Food Insecurity: Not on file  Transportation Needs: Not on file  Physical Activity: Not on file  Stress: Not on file  Social Connections: Not on file  Intimate Partner Violence: Not on file    FAMILY HISTORY: Family History  Problem Relation Age of Onset   Heart attack Mother     Review of Systems  Constitutional:  Positive for fatigue. Negative for appetite change, chills, fever and unexpected weight change.  HENT:   Negative for hearing loss, lump/mass and trouble swallowing.   Eyes:  Negative for eye problems and icterus.  Respiratory:  Negative for chest tightness, cough and shortness of breath.   Cardiovascular:  Negative for chest pain, leg swelling and palpitations.  Gastrointestinal:  Positive for diarrhea. Negative for abdominal distention, abdominal pain, constipation, nausea and vomiting.  Endocrine: Negative for hot flashes.  Genitourinary:  Negative for difficulty urinating.   Musculoskeletal:  Negative for arthralgias.  Skin:  Negative for itching and rash.  Neurological:  Negative for dizziness, extremity weakness, headaches and numbness.  Hematological:  Negative for adenopathy. Does not bruise/bleed easily.  Psychiatric/Behavioral:  Negative for depression. The patient is not nervous/anxious.      PHYSICAL EXAMINATION  ECOG PERFORMANCE STATUS: 1 - Symptomatic but completely ambulatory  Vitals:   06/30/21 1055  BP: (!) 145/82  Pulse: 90  Resp: 18  Temp: (!) 97.3 F (36.3 C)  SpO2: 100%    Physical Exam Constitutional:      General: She is not in acute distress.    Appearance: Normal appearance. She is not toxic-appearing.  HENT:     Head: Normocephalic and atraumatic.  Eyes:     General: No scleral icterus. Cardiovascular:     Rate and Rhythm: Normal  rate and regular rhythm.     Pulses: Normal pulses.     Heart sounds: Normal heart sounds.  Pulmonary:     Effort: Pulmonary effort is normal.     Breath sounds: Normal breath sounds.  Abdominal:     General: Abdomen is flat. Bowel sounds are normal. There is no distension.     Palpations: Abdomen is soft.     Tenderness: There is no abdominal tenderness.  Musculoskeletal:        General: No swelling.     Cervical back: Neck supple.  Lymphadenopathy:     Cervical: No cervical adenopathy.  Skin:    General: Skin is warm and dry.     Findings: No rash.  Neurological:     General: No focal deficit present.     Mental Status: She is alert.  Psychiatric:        Mood and Affect: Mood normal.        Behavior: Behavior normal.    LABORATORY DATA:  CBC  Component Value Date/Time   WBC 5.3 06/30/2021 1044   WBC 10.8 (H) 05/10/2021 1720   RBC 3.14 (L) 06/30/2021 1044   HGB 10.2 (L) 06/30/2021 1044   HGB 12.9 12/03/2018 1624   HCT 30.7 (L) 06/30/2021 1044   HCT 38.5 12/03/2018 1624   PLT 277 06/30/2021 1044   PLT 255 12/03/2018 1624   MCV 97.8 06/30/2021 1044   MCV 92 12/03/2018 1624   MCH 32.5 06/30/2021 1044   MCHC 33.2 06/30/2021 1044   RDW 17.2 (H) 06/30/2021 1044   RDW 12.3 12/03/2018 1624   LYMPHSABS 0.4 (L) 06/30/2021 1044   MONOABS 0.3 06/30/2021 1044   EOSABS 0.0 06/30/2021 1044   BASOSABS 0.0 06/30/2021 1044    CMP     Component Value Date/Time   NA 141 06/30/2021 1044   NA 143 12/03/2018 1624   K 3.6 06/30/2021 1044   CL 108 06/30/2021 1044   CO2 23 06/30/2021 1044   GLUCOSE 175 (H) 06/30/2021 1044   BUN 18 06/30/2021 1044   BUN 17 12/03/2018 1624   CREATININE 0.77 06/30/2021 1044   CALCIUM 9.8 06/30/2021 1044   PROT 6.7 06/30/2021 1044   ALBUMIN 4.2 06/30/2021 1044   AST 19 06/30/2021 1044   ALT 36 06/30/2021 1044   ALKPHOS 81 06/30/2021 1044   BILITOT 0.4 06/30/2021 1044   GFRNONAA >60 06/30/2021 1044   GFRAA 97 12/03/2018 1624      ASSESSMENT and THERAPY PLAN:   Malignant neoplasm of upper-outer quadrant of right breast in female, estrogen receptor positive ([facility]) Roberta Angell is a 61 year old woman with stage Ib triple positive breast cancer undergoing neoadjuvant chemotherapy with Taxotere, carboplatin, Herceptin, Perjeta.  Varshini is tolerating treatment moderately well.  For the diarrhea in addition to Imodium I sent in a prescription for Lomotil in case she needs it.  I also placed orders for her post neoadjuvant MRI as well as request for follow-up for cycle 6 in 3 weeks.  I recommended continued healthy diet and exercise and we will see her back prior to her next treatment.   All questions were answered. The patient knows to call the clinic with any problems, questions or concerns. We can certainly see the patient much sooner if necessary. Total encounter time:20 minutes*in face-to-face visit time, chart review, lab review, care coordination, order entry, and documentation of the encounter time.    Wilber Bihari, NP 06/30/21 3:57 PM Medical Oncology and Hematology [facility] [place], [place] [address] Tel. [phone]    Fax. [phone]  *Total Encounter Time as defined by the Centers for Medicare and Medicaid Services includes, in addition to the face-to-face time of a patient visit (documented in the note above) non-face-to-face time: obtaining and reviewing outside history, ordering and reviewing medications, tests or procedures, care coordination (communications with other health care professionals or caregivers) and documentation in the medical record.

## 2021-07-01 ENCOUNTER — Encounter: Payer: Self-pay | Admitting: *Deleted

## 2021-07-02 ENCOUNTER — Other Ambulatory Visit: Payer: Self-pay

## 2021-07-02 ENCOUNTER — Inpatient Hospital Stay: Payer: No Typology Code available for payment source

## 2021-07-02 ENCOUNTER — Ambulatory Visit: Payer: No Typology Code available for payment source

## 2021-07-02 VITALS — BP 121/72 | HR 80 | Temp 97.7°F | Resp 18

## 2021-07-02 DIAGNOSIS — C50411 Malignant neoplasm of upper-outer quadrant of right female breast: Secondary | ICD-10-CM

## 2021-07-02 DIAGNOSIS — Z95828 Presence of other vascular implants and grafts: Secondary | ICD-10-CM

## 2021-07-02 DIAGNOSIS — Z5111 Encounter for antineoplastic chemotherapy: Secondary | ICD-10-CM | POA: Diagnosis not present

## 2021-07-02 MED ORDER — SODIUM CHLORIDE 0.9% FLUSH
10.0000 mL | Freq: Once | INTRAVENOUS | Status: AC
  Administered 2021-07-02: 10 mL via INTRAVENOUS

## 2021-07-02 MED ORDER — HEPARIN SOD (PORK) LOCK FLUSH 100 UNIT/ML IV SOLN
500.0000 [IU] | Freq: Once | INTRAVENOUS | Status: AC
  Administered 2021-07-02: 500 [IU] via INTRAVENOUS

## 2021-07-02 MED ORDER — SODIUM CHLORIDE 0.9 % IV SOLN: Freq: Once | INTRAVENOUS | Status: AC

## 2021-07-02 MED ORDER — PEGFILGRASTIM-CBQV 6 MG/0.6ML ~~LOC~~ SOSY
6.0000 mg | PREFILLED_SYRINGE | Freq: Once | SUBCUTANEOUS | Status: AC
  Administered 2021-07-02: 6 mg via SUBCUTANEOUS
  Filled 2021-07-02: qty 0.6

## 2021-07-02 NOTE — Patient Instructions (Signed)
Rehydration, Adult Rehydration is the replacement of body fluids, salts, and minerals (electrolytes) that are lost during dehydration. Dehydration is when there is not enough water or other fluids in the body. This happens when you lose more fluids than you take in. Common causes of dehydration include: Not drinking enough fluids. This can occur when you are ill or doing activities that require a lot of energy, especially in hot weather. Conditions that cause loss of water or other fluids, such as diarrhea, vomiting, sweating, or urinating a lot. Other illnesses, such as fever or infection. Certain medicines, such as those that remove excess fluid from the body (diuretics). Symptoms of mild or moderate dehydration may include thirst, dry lips and mouth, and dizziness. Symptoms of severe dehydration may include increased heart rate, confusion, fainting, and not urinating. For severe dehydration, you may need to get fluids through an IV at the hospital. For mild or moderate dehydration, you can usually rehydrate at home by drinking certain fluids as told by your health care provider. What are the risks? Generally, rehydration is safe. However, taking in too much fluid (overhydration) can be a problem. This is rare. Overhydration can cause an electrolyte imbalance, kidney failure, or a decrease in salt (sodium) levels in the body. Supplies needed You will need an oral rehydration solution (ORS) if your health care provider tells you to use one. This is a drink to treat dehydration. It can be found in pharmacies and retail stores. How to rehydrate Fluids Follow instructions from your health care provider for rehydration. The kind of fluid and the amount you should drink depend on your condition. In general, you should choose drinks that you prefer. If told by your health care provider, drink an ORS. Make an ORS by following instructions on the package. Start by drinking small amounts, about  cup (120  mL) every 5-10 minutes. Slowly increase how much you drink until you have taken the amount recommended by your health care provider. Drink enough clear fluids to keep your urine pale yellow. If you were told to drink an ORS, finish it first, then start slowly drinking other clear fluids. Drink fluids such as: Water. This includes sparkling water and flavored water. Drinking only water can lead to having too little sodium in your body (hyponatremia). Follow the advice of your health care provider. Water from ice chips you suck on. Fruit juice with water you add to it (diluted). Sports drinks. Hot or cold herbal teas. Broth-based soups. Milk or milk products. Food Follow instructions from your health care provider about what to eat while you rehydrate. Your health care provider may recommend that you slowly begin eating regular foods in small amounts. Eat foods that contain a healthy balance of electrolytes, such as bananas, oranges, potatoes, tomatoes, and spinach. Avoid foods that are greasy or contain a lot of sugar. In some cases, you may get nutrition through a feeding tube that is passed through your nose and into your stomach (nasogastric tube, or NG tube). This may be done if you have uncontrolled vomiting or diarrhea. Beverages to avoid  Certain beverages may make dehydration worse. While you rehydrate, avoid drinking alcohol. How to tell if you are recovering from dehydration You may be recovering from dehydration if: You are urinating more often than before you started rehydrating. Your urine is pale yellow. Your energy level improves. You vomit less frequently. You have diarrhea less frequently. Your appetite improves or returns to normal. You feel less dizzy or less light-headed.   Your skin tone and color start to look more normal. Follow these instructions at home: Take over-the-counter and prescription medicines only as told by your health care provider. Do not take sodium  tablets. Doing this can lead to having too much sodium in your body (hypernatremia). Contact a health care provider if: You continue to have symptoms of mild or moderate dehydration, such as: Thirst. Dry lips. Slightly dry mouth. Dizziness. Dark urine or less urine than normal. Muscle cramps. You continue to vomit or have diarrhea. Get help right away if you: Have symptoms of dehydration that get worse. Have a fever. Have a severe headache. Have been vomiting and the following happens: Your vomiting gets worse or does not go away. Your vomit includes blood or green matter (bile). You cannot eat or drink without vomiting. Have problems with urination or bowel movements, such as: Diarrhea that gets worse or does not go away. Blood in your stool (feces). This may cause stool to look black and tarry. Not urinating, or urinating only a small amount of very dark urine, within 6-8 hours. Have trouble breathing. Have symptoms that get worse with treatment. These symptoms may represent a serious problem that is an emergency. Do not wait to see if the symptoms will go away. Get medical help right away. Call your local emergency services (911 in the [place] Do not drive yourself to the hospital. Summary Rehydration is the replacement of body fluids and minerals (electrolytes) that are lost during dehydration. Follow instructions from your health care provider for rehydration. The kind of fluid and amount you should drink depend on your condition. Slowly increase how much you drink until you have taken the amount recommended by your health care provider. Contact your health care provider if you continue to show signs of mild or moderate dehydration. This information is not intended to replace advice given to you by your health care provider. Make sure you discuss any questions you have with your health care provider. Document Revised: 03/13/2019 Document Reviewed: 01/21/2019 Elsevier Patient  Education  [place].  Pegfilgrastim Injection What is this medication? PEGFILGRASTIM (PEG fil gra stim) lowers the risk of infection in people who are receiving chemotherapy. It works by Building control surveyor make more white blood cells, which protects your body from infection. It may also be used to help people who have been exposed to high doses of radiation. This medicine may be used for other purposes; ask your health care provider or pharmacist if you have questions. COMMON BRAND NAME(S): [employer], Neulasta, Nyvepria, Stimufend, UDENYCA, Ziextenzo What should I tell my care team before I take this medication? They need to know if you have any of these conditions: Kidney disease Latex allergy Ongoing radiation therapy Sickle cell disease Skin reactions to acrylic adhesives (On-Body Injector only) An unusual or allergic reaction to pegfilgrastim, filgrastim, other medications, foods, dyes, or preservatives Pregnant or trying to get pregnant Breast-feeding How should I use this medication? This medication is for injection under the skin. If you get this medication at home, you will be taught how to prepare and give the pre-filled syringe or how to use the On-body Injector. Refer to the patient Instructions for Use for detailed instructions. Use exactly as directed. Tell your care team immediately if you suspect that the On-body Injector may not have performed as intended or if you suspect the use of the On-body Injector resulted in a missed or partial dose. It is important that you put your used needles  and syringes in a special sharps container. Do not put them in a trash can. If you do not have a sharps container, call your pharmacist or care team to get one. Talk to your care team about the use of this medication in children. While this medication may be prescribed for selected conditions, precautions do apply. Overdosage: If you think you have taken too much of this  medicine contact a poison control center or emergency room at once. NOTE: This medicine is only for you. Do not share this medicine with others. What if I miss a dose? It is important not to miss your dose. Call your care team if you miss your dose. If you miss a dose due to an On-body Injector failure or leakage, a new dose should be administered as soon as possible using a single prefilled syringe for manual use. What may interact with this medication? Interactions have not been studied. This list may not describe all possible interactions. Give your health care provider a list of all the medicines, herbs, non-prescription drugs, or dietary supplements you use. Also tell them if you smoke, drink alcohol, or use illegal drugs. Some items may interact with your medicine. What should I watch for while using this medication? Your condition will be monitored carefully while you are receiving this medication. You may need blood work done while you are taking this medication. Talk to your care team about your risk of cancer. You may be more at risk for certain types of cancer if you take this medication. If you are going to need a MRI, CT scan, or other procedure, tell your care team that you are using this medication (On-Body Injector only). What side effects may I notice from receiving this medication? Side effects that you should report to your care team as soon as possible: Allergic reactions--skin rash, itching, hives, swelling of the face, lips, tongue, or throat Capillary leak syndrome--stomach or muscle pain, unusual weakness or fatigue, feeling faint or lightheaded, decrease in the amount of urine, swelling of the ankles, hands, or feet, trouble breathing High white blood cell level--fever, fatigue, trouble breathing, night sweats, change in vision, weight loss Inflammation of the aorta--fever, fatigue, back, chest, or stomach pain, severe headache Kidney injury (glomerulonephritis)--decrease  in the amount of urine, red or dark brown urine, foamy or bubbly urine, swelling of the ankles, hands, or feet Shortness of breath or trouble breathing Spleen injury--pain in upper left stomach or shoulder Unusual bruising or bleeding Side effects that usually do not require medical attention (report to your care team if they continue or are bothersome): Bone pain Pain in the hands or feet This list may not describe all possible side effects. Call your doctor for medical advice about side effects. You may report side effects to FDA at 1-800-FDA-1088. Where should I keep my medication? Keep out of the reach of children. If you are using this medication at home, you will be instructed on how to store it. Throw away any unused medication after the expiration date on the label. NOTE: This sheet is a summary. It may not cover all possible information. If you have questions about this medicine, talk to your doctor, pharmacist, or health care provider.  2023 Elsevier/Gold Standard (2020-12-11 00:00:00)

## 2021-07-07 ENCOUNTER — Telehealth: Payer: Self-pay

## 2021-07-07 NOTE — Telephone Encounter (Signed)
Returned patient's call regarding diarrhea. LVM for patient encouraging to take medication as prescribed and offered further evaluation in our [facility] by our PA, Milinda Cave. Patient left number to call office back with response.

## 2021-07-08 ENCOUNTER — Ambulatory Visit
Admission: RE | Admit: 2021-07-08 | Discharge: 2021-07-08 | Disposition: A | Discharge status: Home / Self Care | Payer: No Typology Code available for payment source | Source: Ambulatory Visit | Attending: Adult Health | Admitting: Adult Health

## 2021-07-08 ENCOUNTER — Telehealth: Payer: Self-pay

## 2021-07-08 DIAGNOSIS — Z170 Estrogen receptor positive status [ER+]: Secondary | ICD-10-CM

## 2021-07-08 IMAGING — MR MR BREAST BILAT WO/W CM
8 of 13 series · 31 of 48 positions shown · IV contrast (7 ml gadavist)
Comparison: Prior exams including the previous breast MRI due
[DATE]

CLINICAL DATA: Assess response to neoadjuvant chemotherapy. Patient
diagnosed with grade 2 invasive ductal carcinoma of the right breast
on [DATE]. She underwent 2 MRI guided core needle biopsies on
[DATE], 1 revealing grade 2 invasive ductal carcinoma barbell
shaped biopsy clip, the other revealing fibrocystic changes,
negative for carcinoma, cylinder shaped biopsy clip.

EXAM:
BILATERAL BREAST MRI WITH AND WITHOUT CONTRAST
TECHNIQUE: Multiplanar, multisequence MR images of both breasts were obtained
prior to and following the intravenous administration of 7 ml of
Gadavist

[Series 2: t2_tirm_tra ipat (a-p) · axial · 3.0mm · 0.70mm/px · 1 of 55 slices shown]
[im 1/55]
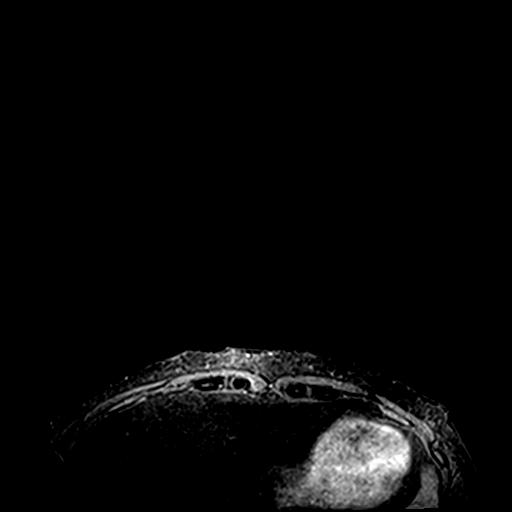

[Series 3: fl3d pre-cm no · axial · non-contrast · 1.2mm · 0.94mm/px · z∈[-74,+97]mm · 5 of 144 slices shown]
[im 1/144]
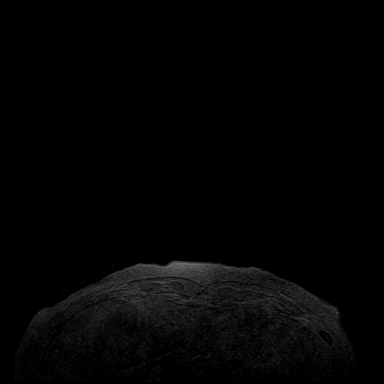
[im 36/144]
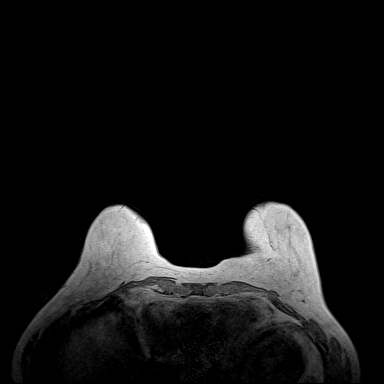
[im 72/144]
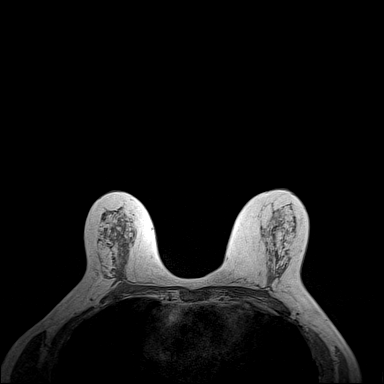
[im 108/144]
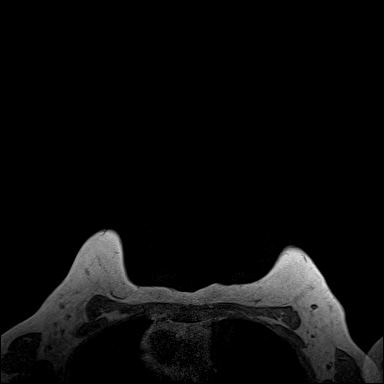
[im 144/144]
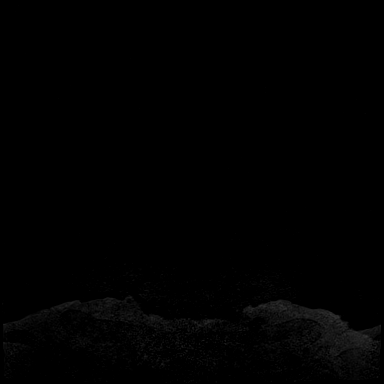

[Series 5: fl3d pre-cm · axial · non-contrast · 1.2mm · 0.94mm/px · z∈[-74,+97]mm · 5 of 144 slices shown]
[im 1/144]
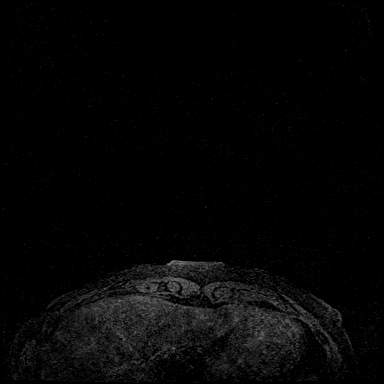
[im 36/144]
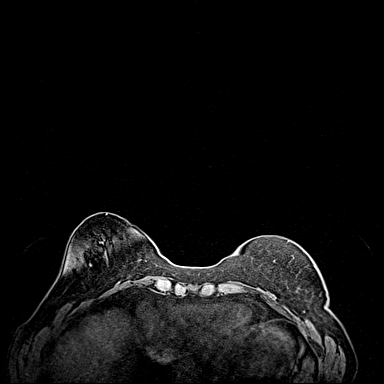
[im 72/144]
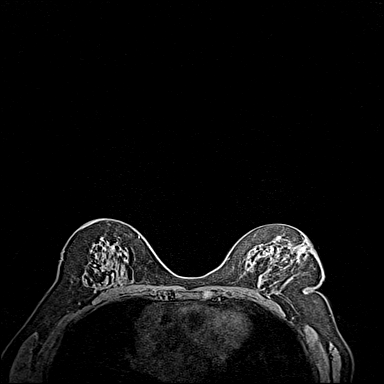
[im 108/144]
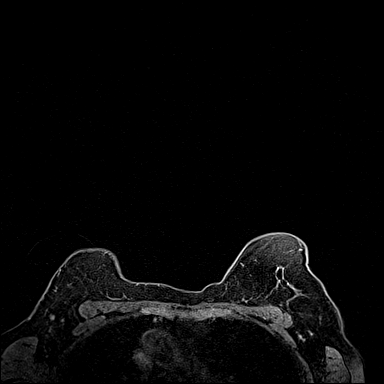
[im 144/144]
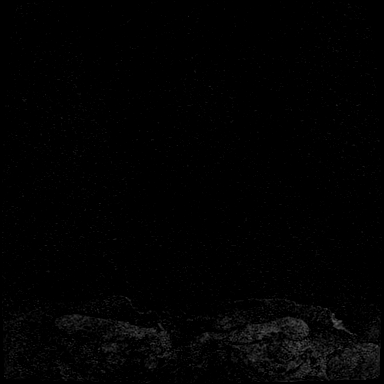

[Series 6: fl3d post immediate · axial · 1.2mm · 0.94mm/px · z∈[-74,+97]mm · 5 of 144 slices shown (1 of 3)]
[im 1/144]
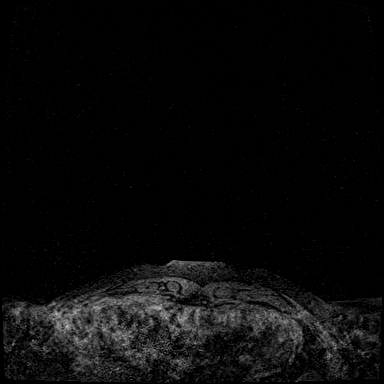
[im 36/144]
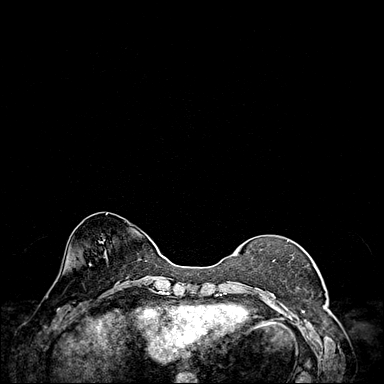
[im 72/144]
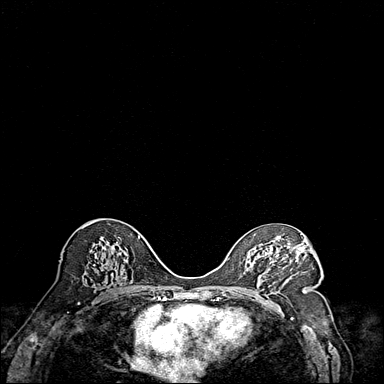
[im 108/144]
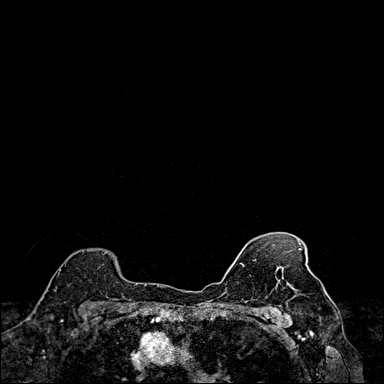
[im 144/144]
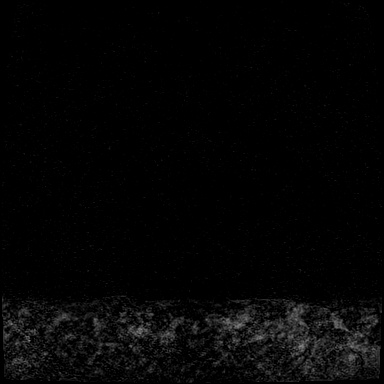

[Series 7: fl3d post immediate · axial · 1.2mm · 0.94mm/px · z∈[-74,+97]mm · 5 of 144 slices shown (2 of 3)]
[im 1/144]
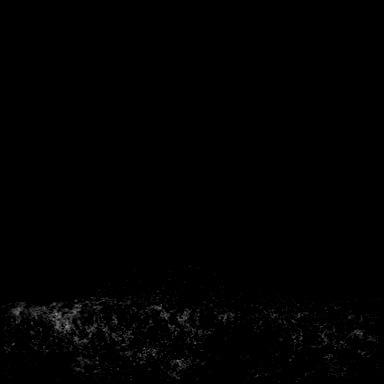
[im 36/144]
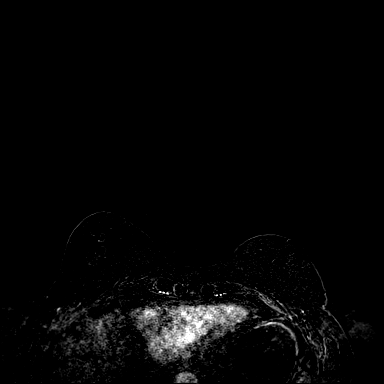
[im 72/144]
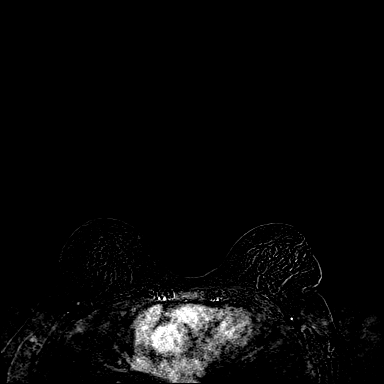
[im 108/144]
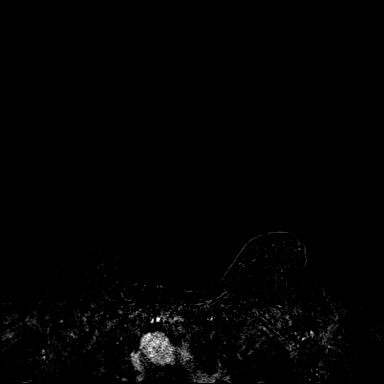
[im 144/144]
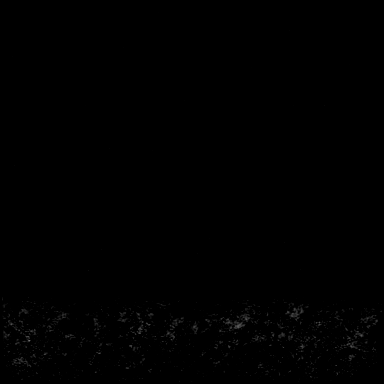

[Series 8: fl3d post immediate · axial · 172.8mm · 0.94mm/px · 1 of 1 slices shown (3 of 3)]
[im 1/1]
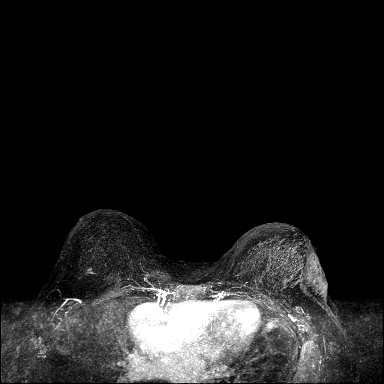

[Series 9: fl3d post 3min · axial · 1.2mm · 0.94mm/px · z∈[-74,+97]mm · 5 of 144 slices shown]
[im 1/144]
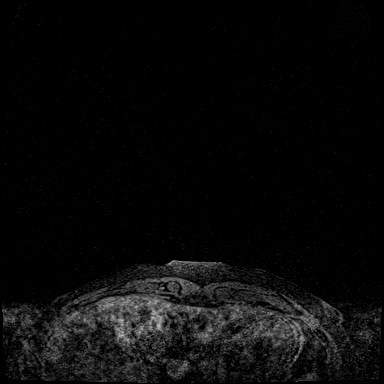
[im 36/144]
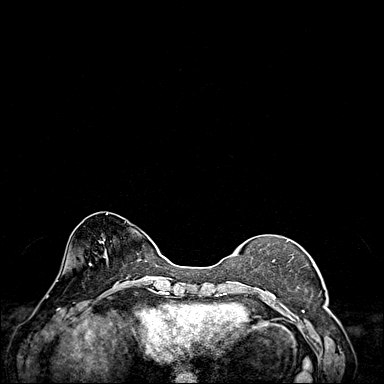
[im 72/144]
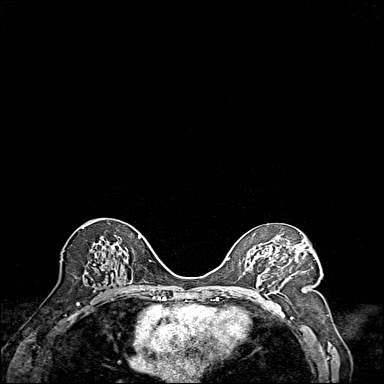
[im 108/144]
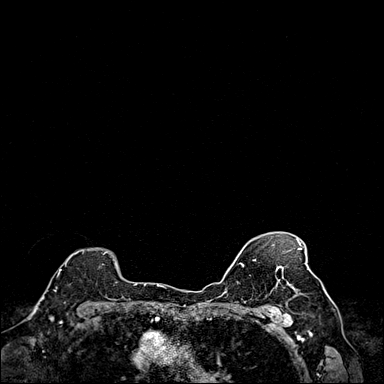
[im 144/144]
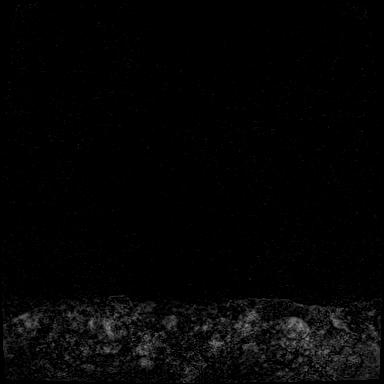

[Series 10: fl3d post 3min_sub · axial · 1.2mm · 0.94mm/px · z∈[-74,+28]mm · 4 of 144 slices shown]
[im 1/144]
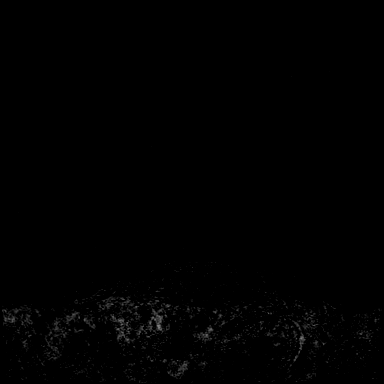
[im 29/144]
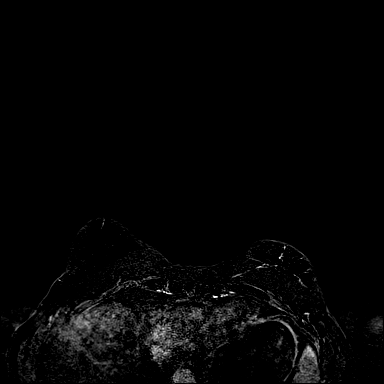
[im 58/144]
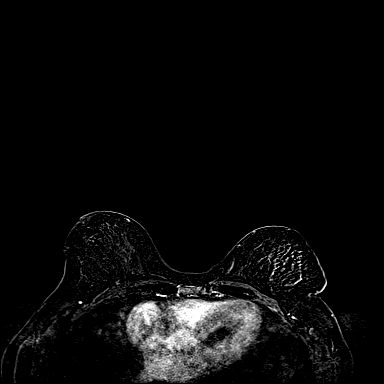
[im 86/144]
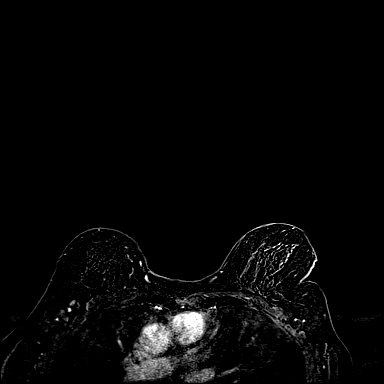

[31 of 48 positions shown; findings below may reference images not displayed]

Three-dimensional MR images were rendered by post-processing of the
original MR data on an independent workstation. The
three-dimensional MR images were interpreted, and findings are
reported in the following complete MRI report for this study. Three
dimensional images were evaluated at the independent interpreting
workstation using the DynaCAD thin client.
FINDINGS: Breast composition: c. Heterogeneous fibroglandular tissue.

Background parenchymal enhancement: Mild

Right breast: There is only a small area of residual enhancement,
lying along the inferior margin the index breast carcinoma, in the
lower outer right breast, posterior depth, measuring 8 x 6 x 8 mm.
Remainder of the previously identified 2.3 cm mass shows no residual
enhancement. There has been a decrease in overall size of the mass
on T1 weighted imaging, currently 1.9 x 1.6 cm in the axial plane,
previously 2.4 x 2.3 cm.

There is no abnormal enhancement associated with the barbell shaped
biopsy clip from the MRI guided core needle biopsy that demonstrated
grade 2 invasive ductal carcinoma.

There is no abnormal enhancement medial to the index lesion to
correspond to the previously biopsied area of fibrocystic change,
cylinder shaped clip.

There are no new masses.

Left breast: No mass or abnormal enhancement.

Lymph nodes: No abnormal appearing lymph nodes.

Ancillary findings:  None.
IMPRESSION: 1. Significant positive response to neoadjuvant chemotherapy. The
index breast carcinoma shown a reduction in size and a more
significant reduction in enhancement, enhancement now spanning 8 x 6
x 8 mm.
2. There are no other areas of abnormal enhancement right breast.
All of the previously seen enhancement adjacent to the index
carcinoma has resolved.
3. No evidence of left breast malignancy.
4. No evidence of metastatic lymphadenopathy.

RECOMMENDATION:
1. Treatment as planned for the known right breast carcinoma.

BI-RADS CATEGORY  6: Known biopsy-proven malignancy.

## 2021-07-08 MED ORDER — GADOBUTROL 1 MMOL/ML IV SOLN
7.0000 mL | Freq: Once | INTRAVENOUS | Status: AC | PRN
Start: 2021-07-08 — End: 2021-07-08
  Administered 2021-07-08: 7 mL via INTRAVENOUS

## 2021-07-08 NOTE — Telephone Encounter (Signed)
Pt called to report that she is having stools that are formed after taking Lomotil but her "ass is on fire." Patient using Desitin with no relief. Patient has concerns about upcoming chemo and the affect it has on her skin and her stools. Patient is requesting dose reduction.

## 2021-07-09 ENCOUNTER — Encounter: Payer: Self-pay | Admitting: *Deleted

## 2021-07-15 ENCOUNTER — Telehealth: Payer: Self-pay | Admitting: *Deleted

## 2021-07-15 NOTE — Telephone Encounter (Signed)
Pt left VM stating " I am just calling to report symptoms as requested by Dr Chryl Heck and wondering if further dose reductions need to be made for my final treatment next week "  " Today I developed very watery diarrhea and started the antidiarrhea  today - and I developed a rash on my chest 2 days ago but that is improving "  " So just let Dr Chryl Heck know since she wanted me to keep her updated"  No needs or concerns stated.  This message will be forwarded to MD for review.

## 2021-07-20 MED FILL — Dexamethasone Sodium Phosphate Inj 100 MG/10ML: INTRAMUSCULAR | Qty: 1 | Status: AC

## 2021-07-20 MED FILL — Fosaprepitant Dimeglumine For IV Infusion 150 MG (Base Eq): INTRAVENOUS | Qty: 5 | Status: AC

## 2021-07-21 ENCOUNTER — Inpatient Hospital Stay (HOSPITAL_BASED_OUTPATIENT_CLINIC_OR_DEPARTMENT_OTHER): Payer: No Typology Code available for payment source | Admitting: Physician Assistant

## 2021-07-21 ENCOUNTER — Inpatient Hospital Stay: Payer: No Typology Code available for payment source

## 2021-07-21 ENCOUNTER — Other Ambulatory Visit: Payer: Self-pay | Admitting: Hematology and Oncology

## 2021-07-21 ENCOUNTER — Other Ambulatory Visit: Payer: Self-pay

## 2021-07-21 VITALS — BP 154/76 | HR 101 | Temp 97.8°F | Resp 17 | Ht 62.0 in | Wt 145.3 lb

## 2021-07-21 DIAGNOSIS — C50411 Malignant neoplasm of upper-outer quadrant of right female breast: Secondary | ICD-10-CM | POA: Diagnosis not present

## 2021-07-21 DIAGNOSIS — Z5111 Encounter for antineoplastic chemotherapy: Secondary | ICD-10-CM | POA: Diagnosis not present

## 2021-07-21 DIAGNOSIS — R197 Diarrhea, unspecified: Secondary | ICD-10-CM

## 2021-07-21 DIAGNOSIS — Z170 Estrogen receptor positive status [ER+]: Secondary | ICD-10-CM | POA: Diagnosis not present

## 2021-07-21 DIAGNOSIS — Z95828 Presence of other vascular implants and grafts: Secondary | ICD-10-CM

## 2021-07-21 LAB — CBC WITH DIFFERENTIAL (CANCER CENTER ONLY)
Abs Immature Granulocytes: 0 10*3/uL (ref 0.00–0.07)
Basophils Absolute: 0 10*3/uL (ref 0.0–0.1)
Basophils Relative: 0 %
Eosinophils Absolute: 0 10*3/uL (ref 0.0–0.5)
Eosinophils Relative: 0 %
HCT: 31 % — ABNORMAL LOW (ref 36.0–46.0)
Hemoglobin: 10.4 g/dL — ABNORMAL LOW (ref 12.0–15.0)
Immature Granulocytes: 0 %
Lymphocytes Relative: 12 %
Lymphs Abs: 0.3 10*3/uL — ABNORMAL LOW (ref 0.7–4.0)
MCH: 33 pg (ref 26.0–34.0)
MCHC: 33.5 g/dL (ref 30.0–36.0)
MCV: 98.4 fL (ref 80.0–100.0)
Monocytes Absolute: 0.1 10*3/uL (ref 0.1–1.0)
Monocytes Relative: 4 %
Neutro Abs: 2.1 10*3/uL (ref 1.7–7.7)
Neutrophils Relative %: 84 %
Platelet Count: 269 10*3/uL (ref 150–400)
RBC: 3.15 MIL/uL — ABNORMAL LOW (ref 3.87–5.11)
RDW: 16.1 % — ABNORMAL HIGH (ref 11.5–15.5)
WBC Count: 2.5 10*3/uL — ABNORMAL LOW (ref 4.0–10.5)
nRBC: 0 % (ref 0.0–0.2)

## 2021-07-21 LAB — CMP (CANCER CENTER ONLY)
ALT: 25 U/L (ref 0–44)
AST: 16 U/L (ref 15–41)
Albumin: 4.1 g/dL (ref 3.5–5.0)
Alkaline Phosphatase: 73 U/L (ref 38–126)
Anion gap: 7 (ref 5–15)
BUN: 15 mg/dL (ref 8–23)
CO2: 25 mmol/L (ref 22–32)
Calcium: 9.4 mg/dL (ref 8.9–10.3)
Chloride: 110 mmol/L (ref 98–111)
Creatinine: 0.62 mg/dL (ref 0.44–1.00)
GFR, Estimated: >60 mL/min (ref >60)
Glucose, Bld: 155 mg/dL — ABNORMAL HIGH (ref 70–99)
Potassium: 3.6 mmol/L (ref 3.5–5.1)
Sodium: 142 mmol/L (ref 135–145)
Total Bilirubin: 0.2 mg/dL — ABNORMAL LOW (ref 0.3–1.2)
Total Protein: 6.3 g/dL — ABNORMAL LOW (ref 6.5–8.1)

## 2021-07-21 MED ORDER — ACETAMINOPHEN 325 MG PO TABS
650.0000 mg | ORAL_TABLET | Freq: Once | ORAL | Status: AC
  Administered 2021-07-21: 650 mg via ORAL
  Filled 2021-07-21: qty 2

## 2021-07-21 MED ORDER — FLUCONAZOLE 100 MG PO TABS: 100.0000 mg | ORAL_TABLET | Freq: Every day | ORAL | 0 refills | Status: DC

## 2021-07-21 MED ORDER — DIPHENHYDRAMINE HCL 25 MG PO CAPS
50.0000 mg | ORAL_CAPSULE | Freq: Once | ORAL | Status: AC
  Administered 2021-07-21: 50 mg via ORAL
  Filled 2021-07-21: qty 2

## 2021-07-21 MED ORDER — SODIUM CHLORIDE 0.9 % IV SOLN: Freq: Once | INTRAVENOUS | Status: AC

## 2021-07-21 MED ORDER — SODIUM CHLORIDE 0.9% FLUSH
10.0000 mL | INTRAVENOUS | Status: DC | PRN
  Administered 2021-07-21: 10 mL

## 2021-07-21 MED ORDER — HEPARIN SOD (PORK) LOCK FLUSH 100 UNIT/ML IV SOLN
500.0000 [IU] | Freq: Once | INTRAVENOUS | Status: AC | PRN
  Administered 2021-07-21: 500 [IU]

## 2021-07-21 MED ORDER — CIPROFLOXACIN HCL 500 MG PO TABS: 500.0000 mg | ORAL_TABLET | Freq: Two times a day (BID) | ORAL | 0 refills | Status: DC

## 2021-07-21 MED ORDER — SODIUM CHLORIDE 0.9 % IV SOLN
150.0000 mg | Freq: Once | INTRAVENOUS | Status: AC
  Administered 2021-07-21: 150 mg via INTRAVENOUS
  Filled 2021-07-21: qty 150

## 2021-07-21 MED ORDER — SODIUM CHLORIDE 0.9 % IV SOLN
420.0000 mg | Freq: Once | INTRAVENOUS | Status: AC
  Administered 2021-07-21: 420 mg via INTRAVENOUS
  Filled 2021-07-21: qty 14

## 2021-07-21 MED ORDER — PALONOSETRON HCL INJECTION 0.25 MG/5ML
0.2500 mg | Freq: Once | INTRAVENOUS | Status: AC
  Administered 2021-07-21: 0.25 mg via INTRAVENOUS
  Filled 2021-07-21: qty 5

## 2021-07-21 MED ORDER — SODIUM CHLORIDE 0.9 % IV SOLN
10.0000 mg | Freq: Once | INTRAVENOUS | Status: AC
  Administered 2021-07-21: 10 mg via INTRAVENOUS
  Filled 2021-07-21: qty 10

## 2021-07-21 MED ORDER — SODIUM CHLORIDE 0.9 % IV SOLN
45.0000 mg/m2 | Freq: Once | INTRAVENOUS | Status: AC
  Administered 2021-07-21: 80 mg via INTRAVENOUS
  Filled 2021-07-21: qty 8

## 2021-07-21 MED ORDER — SODIUM CHLORIDE 0.9 % IV SOLN
409.2000 mg | Freq: Once | INTRAVENOUS | Status: AC
  Administered 2021-07-21: 410 mg via INTRAVENOUS
  Filled 2021-07-21: qty 41

## 2021-07-21 MED ORDER — SODIUM CHLORIDE 0.9% FLUSH
10.0000 mL | Freq: Once | INTRAVENOUS | Status: AC
  Administered 2021-07-21: 10 mL

## 2021-07-21 MED ORDER — TRASTUZUMAB-DKST CHEMO 150 MG IV SOLR
6.0000 mg/kg | Freq: Once | INTRAVENOUS | Status: AC
  Administered 2021-07-21: 399 mg via INTRAVENOUS
  Filled 2021-07-21: qty 19

## 2021-07-21 NOTE — Progress Notes (Signed)
[facility] Follow up:    [name], [place] [address]   DIAGNOSIS:  Cancer Staging  Malignant neoplasm of upper-outer quadrant of right breast in female, estrogen receptor positive ([facility]) Staging form: Breast, AJCC 8th Edition - Clinical stage from 03/17/2021: Stage IB (cT2, cN0, cM0, G2, ER+, PR+, HER2+) - Unsigned Stage prefix: Initial diagnosis Method of lymph node assessment: Clinical Histologic grading system: 3 grade system   SUMMARY OF ONCOLOGIC HISTORY: Oncology History  Malignant neoplasm of upper-outer quadrant of right breast in female, estrogen receptor positive ([facility])  03/16/2021 Initial Diagnosis   Malignant neoplasm of upper-outer quadrant of right breast in female, estrogen receptor positive ([facility])   04/07/2021 -  Chemotherapy   Patient is on Treatment Plan : BREAST  Docetaxel + Carboplatin + Trastuzumab + Pertuzumab  (TCHP) q21d       Genetic Testing   [facility] CustomNext Panel was Negative. Report date is 03/28/2021.  The CustomNext-Cancer+RNAinsight panel offered by [facility] includes sequencing and rearrangement analysis for the following 47 genes:  APC, ATM, AXIN2, BARD1, BMPR1A, BRCA1, BRCA2, BRIP1, CDH1, CDK4, CDKN2A, CHEK2, CTNNA1, DICER1, EPCAM, GREM1, HOXB13, KIT, MEN1, MLH1, MSH2, MSH3, MSH6, MUTYH, NBN, NF1, NTHL1, PALB2, PDGFRA, PMS2, POLD1, POLE, PTEN, RAD50, RAD51C, RAD51D, SDHA, SDHB, SDHC, SDHD, SMAD4, SMARCA4, STK11, TP53, TSC1, TSC2, and VHL.  RNA data is routinely analyzed for use in variant interpretation for all genes.     CURRENT THERAPY: TCHP, due for cycle 6 today.   INTERVAL HISTORY: Tereasa Yilmaz 61 y.o. female returns for a follow up prior to Cycle 6 of TCHP. She is unaccompanied for this visit.   Ms. Pirani reports that she continues to struggle with diarrhea following GSCF injection lasting over a week. She reports up to 3-4 episodes of diarrhea per day. She is currently taking  Lomotil as needed with minimal relief. She has decreased her coffee intake as well. She is applying lidocaine cream to the skin involving rectus/anus due to burning sensation. She is otherwise feeling well. She reports her energy levels are stable and is able to complete her daily activites on her own. She denies nausea, vomiting or abdominal pain. She denies any breast pain and cannot feel a palpable mass. She reports some pain in her feet for the last few days but denies any numbness/tingling. She denies any interference with balance.She denies fevers, chills, shortness of breath, chest pain or cough. She has no other complaints.    Patient Active Problem List   Diagnosis Date Noted   Dysuria 06/08/2021   Port-A-Cath in place 05/18/2021   Chemotherapy induced diarrhea 04/27/2021   Rash and nonspecific skin eruption 04/27/2021   UTI (urinary tract infection) 04/27/2021   Bone pain due to granulocyte colony stimulating factor 04/27/2021   Transaminitis 04/27/2021   Genetic testing 03/31/2021   Malignant neoplasm of upper-outer quadrant of right breast in female, estrogen receptor positive ([facility]) 03/16/2021   History of cervical dysplasia 02/17/2021   Hot flashes due to menopause 09/16/2019   Typical atrial flutter ([facility]) 01/01/2019    is allergic to bee venom.  MEDICAL HISTORY: Past Medical History:  Diagnosis Date   Abnormal bleeding in menstrual cycle    Abnormal Pap smear of vagina    Breast cancer ([facility])    COVID 08/2019   Endometrial polyp    Hot flashes    Irregular heart beats    PONV (postoperative nausea and vomiting)    Typical  atrial flutter ([facility]) 01/01/2019    SURGICAL HISTORY: Past Surgical History:  Procedure Laterality Date   BREAST BIOPSY Right 04/05/2021   BREAST IMPLANT REMOVAL Bilateral 2021   breast implants Right 2002   CHOLECYSTECTOMY     PORTACATH PLACEMENT N/A 04/06/2021   Procedure: INSERTION PORT-A-CATH;  Surgeon: Stark Klein, MD;  Location: [facility];   Service: General;  Laterality: N/A;   SHOULDER SURGERY     Rotator cuff repair    SOCIAL HISTORY: Social History   Socioeconomic History   Marital status: Married    Spouse name: Not on file   Number of children: Not on file   Years of education: Not on file   Highest education level: Not on file  Occupational History   Not on file  Tobacco Use   Smoking status: Never   Smokeless tobacco: Never  Vaping Use   Vaping Use: Never used  Substance and Sexual Activity   Alcohol use: Not Currently    Alcohol/week: 14.0 standard drinks of alcohol    Types: 14 Cans of beer per week   Drug use: Not Currently   Sexual activity: Not on file  Other Topics Concern   Not on file  Social History Narrative   Not on file   Social Determinants of Health   Financial Resource Strain: Not on file  Food Insecurity: Not on file  Transportation Needs: Not on file  Physical Activity: Not on file  Stress: Not on file  Social Connections: Not on file  Intimate Partner Violence: Not on file    FAMILY HISTORY: Family History  Problem Relation Age of Onset   Heart attack Mother     Review of Systems  Constitutional:  Positive for fatigue. Negative for appetite change, chills, fever and unexpected weight change.  HENT:   Negative for hearing loss, lump/mass and trouble swallowing.   Eyes:  Negative for eye problems and icterus.  Respiratory:  Negative for chest tightness, cough and shortness of breath.   Cardiovascular:  Negative for chest pain, leg swelling and palpitations.  Gastrointestinal:  Positive for diarrhea. Negative for abdominal distention, abdominal pain, constipation, nausea and vomiting.  Endocrine: Negative for hot flashes.  Genitourinary:  Negative for difficulty urinating.   Musculoskeletal:  Negative for arthralgias.  Skin:  Negative for itching and rash.  Neurological:  Negative for dizziness, extremity weakness, headaches and numbness.  Hematological:  Negative for  adenopathy. Does not bruise/bleed easily.  Psychiatric/Behavioral:  Negative for depression. The patient is not nervous/anxious.       PHYSICAL EXAMINATION  ECOG PERFORMANCE STATUS: 1 - Symptomatic but completely ambulatory  Vitals:   07/21/21 1045  BP: (!) 154/76  Pulse: (!) 101  Resp: 17  Temp: 97.8 F (36.6 C)  SpO2: 98%    Physical Exam Constitutional:      General: She is not in acute distress.    Appearance: Normal appearance. She is not toxic-appearing.  HENT:     Head: Normocephalic and atraumatic.  Eyes:     General: No scleral icterus. Cardiovascular:     Rate and Rhythm: Normal rate and regular rhythm.     Heart sounds: Normal heart sounds.  Pulmonary:     Effort: Pulmonary effort is normal.     Breath sounds: Normal breath sounds.  Abdominal:     General: Abdomen is flat. Bowel sounds are normal. There is no distension.     Palpations: Abdomen is soft.     Tenderness: There is no abdominal  tenderness.  Musculoskeletal:        General: No swelling.  Lymphadenopathy:     Cervical: No cervical adenopathy.  Skin:    General: Skin is warm and dry.     Findings: No rash.  Neurological:     General: No focal deficit present.     Mental Status: She is alert.  Psychiatric:        Mood and Affect: Mood normal.        Behavior: Behavior normal.     LABORATORY DATA:  CBC    Component Value Date/Time   WBC 2.5 (L) 07/21/2021 1024   WBC 10.8 (H) 05/10/2021 1720   RBC 3.15 (L) 07/21/2021 1024   HGB 10.4 (L) 07/21/2021 1024   HGB 12.9 12/03/2018 1624   HCT 31.0 (L) 07/21/2021 1024   HCT 38.5 12/03/2018 1624   PLT 269 07/21/2021 1024   PLT 255 12/03/2018 1624   MCV 98.4 07/21/2021 1024   MCV 92 12/03/2018 1624   MCH 33.0 07/21/2021 1024   MCHC 33.5 07/21/2021 1024   RDW 16.1 (H) 07/21/2021 1024   RDW 12.3 12/03/2018 1624   LYMPHSABS 0.3 (L) 07/21/2021 1024   MONOABS 0.1 07/21/2021 1024   EOSABS 0.0 07/21/2021 1024   BASOSABS 0.0 07/21/2021 1024     CMP     Component Value Date/Time   NA 142 07/21/2021 1024   NA 143 12/03/2018 1624   K 3.6 07/21/2021 1024   CL 110 07/21/2021 1024   CO2 25 07/21/2021 1024   GLUCOSE 155 (H) 07/21/2021 1024   BUN 15 07/21/2021 1024   BUN 17 12/03/2018 1624   CREATININE 0.62 07/21/2021 1024   CALCIUM 9.4 07/21/2021 1024   PROT 6.3 (L) 07/21/2021 1024   ALBUMIN 4.1 07/21/2021 1024   AST 16 07/21/2021 1024   ALT 25 07/21/2021 1024   ALKPHOS 73 07/21/2021 1024   BILITOT 0.2 (L) 07/21/2021 1024   GFRNONAA >60 07/21/2021 1024   GFRAA 97 12/03/2018 1624     ASSESSMENT and THERAPY PLAN:   # Stage Ib Triple Positive Right Breast Cancer: -Currently receiving neoadjuvant chemotherapy with taxotere, carboplatin, herceptin and perjeta.  -Due to cycle 6 of chemotherapy today -Labs from today were reviewed and adequate for treatment.  -Discussed dose modification with Dr. Chryl Heck due to ongoing diarrhea and feet pain that I suspect could be early on neuropathy. Dose of taxotere was reduced from 60 to 45 mg/m2. Rest of the regimen will remain the same. -Patient is schedule for a follow up visit with Dr. Norman Clay on 07/26/2021 to discuss surgery.   # Diarrhea: --Chemotherapy induced --Currently taking Lomotil up to 4 times a day. Advised to add imodium as well --Stay hydrated and apply Anusol cream to rectum/anus to help with pain.   #Feet pain: --Concerned about neuropathy. Advised to monitor closely.   #Frequent UTIs: --No symptoms today. Patient takes cipro as needed. Sent refill today  #Rash: --Sent refill for Diflucan to take as needed. No symptoms today  All questions were answered. The patient knows to call the clinic with any problems, questions or concerns. We can certainly see the patient much sooner if necessary.  I have spent a total of 30 minutes minutes of face-to-face and non-face-to-face time, preparing to see the patient, performing a medically appropriate examination, counseling  and educating the patient, ordering medications/tests, documenting clinical information in the electronic health record,and care coordination.    Dede Query PA-C Dept of Hematology and [place]  [place] at [facility] Phone: [phone]

## 2021-07-21 NOTE — Progress Notes (Signed)
Per Murray Hodgkins, [place] okay to proceed with treatment today with echo from 04/01/21

## 2021-07-21 NOTE — Progress Notes (Signed)
The following biosimilar Kanjinti (trastuzumab-anns) has been selected for use in this patient per insurance.  Henreitta Leber, PharmD

## 2021-07-21 NOTE — Progress Notes (Signed)
Chemotherapy dose reduced because of ongoing diarrhea and neuropathy mentioned at our APP visit.  [name] [name]

## 2021-07-21 NOTE — Progress Notes (Signed)
Adding herceptin and perjeta for adjuvant and maintenance.

## 2021-07-21 NOTE — Patient Instructions (Signed)
[facility]  Discharge Instructions: Thank you for choosing [facility] to provide your oncology and hematology care.   If you have a lab appointment with the [facility], please go directly to the [facility] and check in at the registration area.   Wear comfortable clothing and clothing appropriate for easy access to any Portacath or PICC line.   We strive to give you quality time with your provider. You may need to reschedule your appointment if you arrive late (15 or more minutes).  Arriving late affects you and other patients whose appointments are after yours.  Also, if you miss three or more appointments without notifying the office, you may be dismissed from the clinic at the provider's discretion.      For prescription refill requests, have your pharmacy contact our office and allow 72 hours for refills to be completed.    Today you received the following chemotherapy and/or immunotherapy agents Trastuzumab, pertuzumab, docetaxel, carboplatin   To help prevent nausea and vomiting after your treatment, we encourage you to take your nausea medication as directed.  BELOW ARE SYMPTOMS THAT SHOULD BE REPORTED IMMEDIATELY: *FEVER GREATER THAN 100.4 F (38 C) OR HIGHER *CHILLS OR SWEATING *NAUSEA AND VOMITING THAT IS NOT CONTROLLED WITH YOUR NAUSEA MEDICATION *UNUSUAL SHORTNESS OF BREATH *UNUSUAL BRUISING OR BLEEDING *URINARY PROBLEMS (pain or burning when urinating, or frequent urination) *BOWEL PROBLEMS (unusual diarrhea, constipation, pain near the anus) TENDERNESS IN MOUTH AND THROAT WITH OR WITHOUT PRESENCE OF ULCERS (sore throat, sores in mouth, or a toothache) UNUSUAL RASH, SWELLING OR PAIN  UNUSUAL VAGINAL DISCHARGE OR ITCHING   Items with * indicate a potential emergency and should be followed up as soon as possible or go to the Emergency Department if any problems should occur.  Please show the CHEMOTHERAPY ALERT CARD or  IMMUNOTHERAPY ALERT CARD at check-in to the Emergency Department and triage nurse.  Should you have questions after your visit or need to cancel or reschedule your appointment, please contact [facility]  Dept: [phone]  and follow the prompts.  Office hours are 8:00 a.m. to 4:30 p.m. Monday - Friday. Please note that voicemails left after 4:00 p.m. may not be returned until the following business day.  We are closed weekends and major holidays. You have access to a nurse at all times for urgent questions. Please call the main number to the clinic Dept: [phone] and follow the prompts.   For any non-urgent questions, you may also contact your provider using MyChart. We now offer e-Visits for anyone 61 and older to request care online for non-urgent symptoms. For details visit [email or web address].   Also download the MyChart app! Go to the app store, search "MyChart", open the app, select [facility], and log in with your MyChart username and password.  Due to Covid, a mask is required upon entering the hospital/clinic. If you do not have a mask, one will be given to you upon arrival. For doctor visits, patients may have 1 support Nichalas Coin aged 85 or older with them. For treatment visits, patients cannot have anyone with them due to current Covid guidelines and our immunocompromised population.

## 2021-07-23 ENCOUNTER — Other Ambulatory Visit: Payer: Self-pay

## 2021-07-23 ENCOUNTER — Inpatient Hospital Stay: Payer: No Typology Code available for payment source

## 2021-07-23 VITALS — BP 128/76 | HR 65 | Temp 98.0°F | Resp 16

## 2021-07-23 DIAGNOSIS — Z5111 Encounter for antineoplastic chemotherapy: Secondary | ICD-10-CM | POA: Diagnosis not present

## 2021-07-23 DIAGNOSIS — Z95828 Presence of other vascular implants and grafts: Secondary | ICD-10-CM

## 2021-07-23 DIAGNOSIS — C50411 Malignant neoplasm of upper-outer quadrant of right female breast: Secondary | ICD-10-CM

## 2021-07-23 MED ORDER — PEGFILGRASTIM-CBQV 6 MG/0.6ML ~~LOC~~ SOSY
6.0000 mg | PREFILLED_SYRINGE | Freq: Once | SUBCUTANEOUS | Status: AC
  Administered 2021-07-23: 6 mg via SUBCUTANEOUS

## 2021-07-23 MED ORDER — SODIUM CHLORIDE 0.9 % IV SOLN: Freq: Once | INTRAVENOUS | Status: AC

## 2021-07-23 MED ORDER — HEPARIN SOD (PORK) LOCK FLUSH 100 UNIT/ML IV SOLN
500.0000 [IU] | Freq: Once | INTRAVENOUS | Status: AC | PRN
  Administered 2021-07-23: 500 [IU]

## 2021-07-23 MED ORDER — SODIUM CHLORIDE 0.9% FLUSH
10.0000 mL | Freq: Once | INTRAVENOUS | Status: AC | PRN
  Administered 2021-07-23: 10 mL

## 2021-07-23 NOTE — Patient Instructions (Signed)
Rehydration, Adult Rehydration is the replacement of body fluids, salts, and minerals (electrolytes) that are lost during dehydration. Dehydration is when there is not enough water or other fluids in the body. This happens when you lose more fluids than you take in. Common causes of dehydration include: Not drinking enough fluids. This can occur when you are ill or doing activities that require a lot of energy, especially in hot weather. Conditions that cause loss of water or other fluids, such as diarrhea, vomiting, sweating, or urinating a lot. Other illnesses, such as fever or infection. Certain medicines, such as those that remove excess fluid from the body (diuretics). Symptoms of mild or moderate dehydration may include thirst, dry lips and mouth, and dizziness. Symptoms of severe dehydration may include increased heart rate, confusion, fainting, and not urinating. For severe dehydration, you may need to get fluids through an IV at the hospital. For mild or moderate dehydration, you can usually rehydrate at home by drinking certain fluids as told by your health care provider. What are the risks? Generally, rehydration is safe. However, taking in too much fluid (overhydration) can be a problem. This is rare. Overhydration can cause an electrolyte imbalance, kidney failure, or a decrease in salt (sodium) levels in the body. Supplies needed You will need an oral rehydration solution (ORS) if your health care provider tells you to use one. This is a drink to treat dehydration. It can be found in pharmacies and retail stores. How to rehydrate Fluids Follow instructions from your health care provider for rehydration. The kind of fluid and the amount you should drink depend on your condition. In general, you should choose drinks that you prefer. If told by your health care provider, drink an ORS. Make an ORS by following instructions on the package. Start by drinking small amounts, about  cup (120  mL) every 5-10 minutes. Slowly increase how much you drink until you have taken the amount recommended by your health care provider. Drink enough clear fluids to keep your urine pale yellow. If you were told to drink an ORS, finish it first, then start slowly drinking other clear fluids. Drink fluids such as: Water. This includes sparkling water and flavored water. Drinking only water can lead to having too little sodium in your body (hyponatremia). Follow the advice of your health care provider. Water from ice chips you suck on. Fruit juice with water you add to it (diluted). Sports drinks. Hot or cold herbal teas. Broth-based soups. Milk or milk products. Food Follow instructions from your health care provider about what to eat while you rehydrate. Your health care provider may recommend that you slowly begin eating regular foods in small amounts. Eat foods that contain a healthy balance of electrolytes, such as bananas, oranges, potatoes, tomatoes, and spinach. Avoid foods that are greasy or contain a lot of sugar. In some cases, you may get nutrition through a feeding tube that is passed through your nose and into your stomach (nasogastric tube, or NG tube). This may be done if you have uncontrolled vomiting or diarrhea. Beverages to avoid  Certain beverages may make dehydration worse. While you rehydrate, avoid drinking alcohol. How to tell if you are recovering from dehydration You may be recovering from dehydration if: You are urinating more often than before you started rehydrating. Your urine is pale yellow. Your energy level improves. You vomit less frequently. You have diarrhea less frequently. Your appetite improves or returns to normal. You feel less dizzy or less light-headed.   Your skin tone and color start to look more normal. Follow these instructions at home: Take over-the-counter and prescription medicines only as told by your health care provider. Do not take sodium  tablets. Doing this can lead to having too much sodium in your body (hypernatremia). Contact a health care provider if: You continue to have symptoms of mild or moderate dehydration, such as: Thirst. Dry lips. Slightly dry mouth. Dizziness. Dark urine or less urine than normal. Muscle cramps. You continue to vomit or have diarrhea. Get help right away if you: Have symptoms of dehydration that get worse. Have a fever. Have a severe headache. Have been vomiting and the following happens: Your vomiting gets worse or does not go away. Your vomit includes blood or green matter (bile). You cannot eat or drink without vomiting. Have problems with urination or bowel movements, such as: Diarrhea that gets worse or does not go away. Blood in your stool (feces). This may cause stool to look black and tarry. Not urinating, or urinating only a small amount of very dark urine, within 6-8 hours. Have trouble breathing. Have symptoms that get worse with treatment. These symptoms may represent a serious problem that is an emergency. Do not wait to see if the symptoms will go away. Get medical help right away. Call your local emergency services (911 in the [place] Do not drive yourself to the hospital. Summary Rehydration is the replacement of body fluids and minerals (electrolytes) that are lost during dehydration. Follow instructions from your health care provider for rehydration. The kind of fluid and amount you should drink depend on your condition. Slowly increase how much you drink until you have taken the amount recommended by your health care provider. Contact your health care provider if you continue to show signs of mild or moderate dehydration. This information is not intended to replace advice given to you by your health care provider. Make sure you discuss any questions you have with your health care provider. Document Revised: 03/13/2019 Document Reviewed: 01/21/2019 Elsevier Patient  Education  2023 Elsevier Inc.  

## 2021-07-26 ENCOUNTER — Other Ambulatory Visit: Payer: Self-pay | Admitting: General Surgery

## 2021-07-26 DIAGNOSIS — Z170 Estrogen receptor positive status [ER+]: Secondary | ICD-10-CM

## 2021-08-03 ENCOUNTER — Other Ambulatory Visit: Payer: Self-pay | Admitting: General Surgery

## 2021-08-03 DIAGNOSIS — Z170 Estrogen receptor positive status [ER+]: Secondary | ICD-10-CM

## 2021-08-05 ENCOUNTER — Encounter: Payer: Self-pay | Admitting: *Deleted

## 2021-08-06 ENCOUNTER — Other Ambulatory Visit: Payer: Self-pay | Admitting: Hematology and Oncology

## 2021-08-06 ENCOUNTER — Other Ambulatory Visit: Payer: Self-pay | Admitting: *Deleted

## 2021-08-06 ENCOUNTER — Telehealth: Payer: Self-pay | Admitting: *Deleted

## 2021-08-06 DIAGNOSIS — Z170 Estrogen receptor positive status [ER+]: Secondary | ICD-10-CM

## 2021-08-06 NOTE — Telephone Encounter (Signed)
This RN scheduled pt for Echo per need for maintenance therapy. Informed pt of date and time as well as discussed the therapy. Pt was under the assumption  " I was done not having to come get any more treatment but go and have the surgery "  This RN explained that she is done with the neoadjuvant component of therapy consisting of chemo and antibody therapy.  Goal now is to just do the perjeta and trastuzmab .  Pt verbalized understanding of above as well as date,time and location for the Echo.  No other needs at this time.

## 2021-08-06 NOTE — Progress Notes (Signed)
Due for an echo. Ordered a repeat ECHO. If she is clinically asymptomatic, may consider proceeding with Herceptin and perjeta which is currently planned for next week followed by ECHO as soon as possible.

## 2021-08-09 ENCOUNTER — Encounter: Payer: Self-pay | Admitting: *Deleted

## 2021-08-09 ENCOUNTER — Ambulatory Visit (HOSPITAL_COMMUNITY)
Admission: RE | Admit: 2021-08-09 | Discharge: 2021-08-09 | Disposition: A | Discharge status: Home / Self Care | Payer: No Typology Code available for payment source | Source: Ambulatory Visit | Attending: Hematology and Oncology | Admitting: Hematology and Oncology

## 2021-08-09 DIAGNOSIS — C50411 Malignant neoplasm of upper-outer quadrant of right female breast: Secondary | ICD-10-CM

## 2021-08-09 DIAGNOSIS — Z170 Estrogen receptor positive status [ER+]: Secondary | ICD-10-CM

## 2021-08-09 DIAGNOSIS — I071 Rheumatic tricuspid insufficiency: Secondary | ICD-10-CM | POA: Insufficient documentation

## 2021-08-09 DIAGNOSIS — Z0189 Encounter for other specified special examinations: Secondary | ICD-10-CM | POA: Diagnosis not present

## 2021-08-09 LAB — ECHOCARDIOGRAM COMPLETE
AR max vel: 2.7 cm2
AV Peak grad: 5.8 mmHg
Ao pk vel: 1.2 m/s
Area-P 1/2: 4.29 cm2
Calc EF: 58.9 %
S' Lateral: 2.5 cm
Single Plane A2C EF: 61.2 %
Single Plane A4C EF: 57.7 %

## 2021-08-11 ENCOUNTER — Inpatient Hospital Stay: Payer: No Typology Code available for payment source

## 2021-08-11 ENCOUNTER — Other Ambulatory Visit: Payer: Self-pay | Admitting: *Deleted

## 2021-08-11 ENCOUNTER — Inpatient Hospital Stay: Payer: No Typology Code available for payment source | Admitting: Adult Health

## 2021-08-11 DIAGNOSIS — C50411 Malignant neoplasm of upper-outer quadrant of right female breast: Secondary | ICD-10-CM

## 2021-08-13 ENCOUNTER — Inpatient Hospital Stay (HOSPITAL_BASED_OUTPATIENT_CLINIC_OR_DEPARTMENT_OTHER): Payer: No Typology Code available for payment source | Admitting: Adult Health

## 2021-08-13 ENCOUNTER — Inpatient Hospital Stay: Payer: No Typology Code available for payment source | Attending: Hematology and Oncology

## 2021-08-13 ENCOUNTER — Inpatient Hospital Stay: Payer: No Typology Code available for payment source

## 2021-08-13 ENCOUNTER — Encounter: Payer: Self-pay | Admitting: Adult Health

## 2021-08-13 ENCOUNTER — Other Ambulatory Visit: Payer: Self-pay

## 2021-08-13 VITALS — BP 141/83 | HR 65 | Temp 97.7°F | Wt 145.9 lb

## 2021-08-13 DIAGNOSIS — Z5112 Encounter for antineoplastic immunotherapy: Secondary | ICD-10-CM | POA: Insufficient documentation

## 2021-08-13 DIAGNOSIS — Z170 Estrogen receptor positive status [ER+]: Secondary | ICD-10-CM

## 2021-08-13 DIAGNOSIS — C50411 Malignant neoplasm of upper-outer quadrant of right female breast: Secondary | ICD-10-CM | POA: Diagnosis not present

## 2021-08-13 DIAGNOSIS — Z95828 Presence of other vascular implants and grafts: Secondary | ICD-10-CM

## 2021-08-13 LAB — CBC WITH DIFFERENTIAL (CANCER CENTER ONLY)
Abs Immature Granulocytes: 0.01 10*3/uL (ref 0.00–0.07)
Basophils Absolute: 0 10*3/uL (ref 0.0–0.1)
Basophils Relative: 1 %
Eosinophils Absolute: 0.1 10*3/uL (ref 0.0–0.5)
Eosinophils Relative: 2 %
HCT: 30.5 % — ABNORMAL LOW (ref 36.0–46.0)
Hemoglobin: 10.1 g/dL — ABNORMAL LOW (ref 12.0–15.0)
Immature Granulocytes: 0 %
Lymphocytes Relative: 35 %
Lymphs Abs: 0.9 10*3/uL (ref 0.7–4.0)
MCH: 32.8 pg (ref 26.0–34.0)
MCHC: 33.1 g/dL (ref 30.0–36.0)
MCV: 99 fL (ref 80.0–100.0)
Monocytes Absolute: 0.4 10*3/uL (ref 0.1–1.0)
Monocytes Relative: 17 %
Neutro Abs: 1.1 10*3/uL — ABNORMAL LOW (ref 1.7–7.7)
Neutrophils Relative %: 45 %
Platelet Count: 223 10*3/uL (ref 150–400)
RBC: 3.08 MIL/uL — ABNORMAL LOW (ref 3.87–5.11)
RDW: 15.4 % (ref 11.5–15.5)
WBC Count: 2.5 10*3/uL — ABNORMAL LOW (ref 4.0–10.5)
nRBC: 0 % (ref 0.0–0.2)

## 2021-08-13 LAB — CMP (CANCER CENTER ONLY)
ALT: 24 U/L (ref 0–44)
AST: 22 U/L (ref 15–41)
Albumin: 3.7 g/dL (ref 3.5–5.0)
Alkaline Phosphatase: 64 U/L (ref 38–126)
Anion gap: 5 (ref 5–15)
BUN: 14 mg/dL (ref 8–23)
CO2: 27 mmol/L (ref 22–32)
Calcium: 8.8 mg/dL — ABNORMAL LOW (ref 8.9–10.3)
Chloride: 109 mmol/L (ref 98–111)
Creatinine: 0.7 mg/dL (ref 0.44–1.00)
GFR, Estimated: >60 mL/min (ref >60)
Glucose, Bld: 93 mg/dL (ref 70–99)
Potassium: 4 mmol/L (ref 3.5–5.1)
Sodium: 141 mmol/L (ref 135–145)
Total Bilirubin: 0.3 mg/dL (ref 0.3–1.2)
Total Protein: 5.6 g/dL — ABNORMAL LOW (ref 6.5–8.1)

## 2021-08-13 MED ORDER — SODIUM CHLORIDE 0.9 % IV SOLN: Freq: Once | INTRAVENOUS | Status: AC

## 2021-08-13 MED ORDER — HEPARIN SOD (PORK) LOCK FLUSH 100 UNIT/ML IV SOLN
500.0000 [IU] | Freq: Once | INTRAVENOUS | Status: AC | PRN
  Administered 2021-08-13: 500 [IU]

## 2021-08-13 MED ORDER — SODIUM CHLORIDE 0.9% FLUSH
10.0000 mL | INTRAVENOUS | Status: DC | PRN
  Administered 2021-08-13: 10 mL

## 2021-08-13 MED ORDER — TRASTUZUMAB-ANNS CHEMO 150 MG IV SOLR
6.0000 mg/kg | Freq: Once | INTRAVENOUS | Status: AC
  Administered 2021-08-13: 399 mg via INTRAVENOUS
  Filled 2021-08-13: qty 19

## 2021-08-13 MED ORDER — ACETAMINOPHEN 325 MG PO TABS
650.0000 mg | ORAL_TABLET | Freq: Once | ORAL | Status: AC
  Administered 2021-08-13: 650 mg via ORAL
  Filled 2021-08-13: qty 2

## 2021-08-13 MED ORDER — DIPHENHYDRAMINE HCL 25 MG PO CAPS
50.0000 mg | ORAL_CAPSULE | Freq: Once | ORAL | Status: AC
  Administered 2021-08-13: 50 mg via ORAL
  Filled 2021-08-13: qty 2

## 2021-08-13 MED ORDER — SODIUM CHLORIDE 0.9% FLUSH
10.0000 mL | Freq: Once | INTRAVENOUS | Status: AC
  Administered 2021-08-13: 10 mL

## 2021-08-13 NOTE — Patient Instructions (Signed)
[facility]  Discharge Instructions: Thank you for choosing [facility] to provide your oncology and hematology care.   If you have a lab appointment with the Cancer Center, please go directly to the Cancer Center and check in at the registration area.   Wear comfortable clothing and clothing appropriate for easy access to any Portacath or PICC line.   We strive to give you quality time with your provider. You may need to reschedule your appointment if you arrive late (15 or more minutes).  Arriving late affects you and other patients whose appointments are after yours.  Also, if you miss three or more appointments without notifying the office, you may be dismissed from the clinic at the provider's discretion.      For prescription refill requests, have your pharmacy contact our office and allow 72 hours for refills to be completed.    Today you received the following chemotherapy and/or immunotherapy agents: Trastuzumab      To help prevent nausea and vomiting after your treatment, we encourage you to take your nausea medication as directed.  BELOW ARE SYMPTOMS THAT SHOULD BE REPORTED IMMEDIATELY: *FEVER GREATER THAN 100.4 F (38 C) OR HIGHER *CHILLS OR SWEATING *NAUSEA AND VOMITING THAT IS NOT CONTROLLED WITH YOUR NAUSEA MEDICATION *UNUSUAL SHORTNESS OF BREATH *UNUSUAL BRUISING OR BLEEDING *URINARY PROBLEMS (pain or burning when urinating, or frequent urination) *BOWEL PROBLEMS (unusual diarrhea, constipation, pain near the anus) TENDERNESS IN MOUTH AND THROAT WITH OR WITHOUT PRESENCE OF ULCERS (sore throat, sores in mouth, or a toothache) UNUSUAL RASH, SWELLING OR PAIN  UNUSUAL VAGINAL DISCHARGE OR ITCHING   Items with * indicate a potential emergency and should be followed up as soon as possible or go to the Emergency Department if any problems should occur.  Please show the CHEMOTHERAPY ALERT CARD or IMMUNOTHERAPY ALERT CARD at check-in  to the Emergency Department and triage nurse.  Should you have questions after your visit or need to cancel or reschedule your appointment, please contact [facility]  Dept: [phone]  and follow the prompts.  Office hours are 8:00 a.m. to 4:30 p.m. Monday - Friday. Please note that voicemails left after 4:00 p.m. may not be returned until the following business day.  We are closed weekends and major holidays. You have access to a nurse at all times for urgent questions. Please call the main number to the clinic Dept: [phone] and follow the prompts.   For any non-urgent questions, you may also contact your provider using MyChart. We now offer e-Visits for anyone 18 and older to request care online for non-urgent symptoms. For details visit [email or web address].   Also download the MyChart app! Go to the app store, search "MyChart", open the app, select [facility], and log in with your MyChart username and password.  Masks are optional in the cancer centers. If you would like for your care team to wear a mask while they are taking care of you, please let them know. For doctor visits, patients may have with them one support Omega Slager who is at least 63 years old. At this time, visitors are not allowed in the infusion area. 

## 2021-08-13 NOTE — Assessment & Plan Note (Signed)
[name] [name] is here for follow-up of her stage Ib triple positive breast cancer status post neoadjuvant chemotherapy with surgery scheduled in August.  She is here today for adjuvant Herceptin.  Her most recent echo was normal.  I reviewed that she will continue on those every 3 months.  We discussed the role of her to immunotherapy in her treatment and that it will continue through March to May of 2024 depending on her surgical pathology results.  She is experiencing increased diarrhea and though this is improving she really does not want to receive anything that could potentiate her diarrhea especially prior to a planned trip she has next week with her family and prior to her surgery.  This is understandable and reasonable therefore I took the Perjeta out of her treatment plan for today.  [name] [name] will undergo breast cancer surgery on September 08, 2021 she has a follow-up with Dr. Chryl Heck on September 22, 2021.

## 2021-08-13 NOTE — Progress Notes (Signed)
[facility] Follow up:    [name], [place] [address]   DIAGNOSIS:  Cancer Staging  Malignant neoplasm of upper-outer quadrant of right breast in female, estrogen receptor positive ([place]) Staging form: Breast, AJCC 8th Edition - Clinical stage from 03/17/2021: Stage IB (cT2, cN0, cM0, G2, ER+, PR+, HER2+) - Unsigned Stage prefix: Initial diagnosis Method of lymph node assessment: Clinical Histologic grading system: 3 grade system   SUMMARY OF ONCOLOGIC HISTORY: Oncology History  Malignant neoplasm of upper-outer quadrant of right breast in female, estrogen receptor positive ([place])  03/16/2021 Initial Diagnosis   Malignant neoplasm of upper-outer quadrant of right breast in female, estrogen receptor positive ([place])   04/07/2021 - 07/23/2021 Chemotherapy   Patient is on Treatment Plan : BREAST  Docetaxel + Carboplatin + Trastuzumab + Pertuzumab  (TCHP) q21d       Genetic Testing   [facility] CustomNext Panel was Negative. Report date is 03/28/2021.  The CustomNext-Cancer+RNAinsight panel offered by [facility] includes sequencing and rearrangement analysis for the following 47 genes:  APC, ATM, AXIN2, BARD1, BMPR1A, BRCA1, BRCA2, BRIP1, CDH1, CDK4, CDKN2A, CHEK2, CTNNA1, DICER1, EPCAM, GREM1, HOXB13, KIT, MEN1, MLH1, MSH2, MSH3, MSH6, MUTYH, NBN, NF1, NTHL1, PALB2, PDGFRA, PMS2, POLD1, POLE, PTEN, RAD50, RAD51C, RAD51D, SDHA, SDHB, SDHC, SDHD, SMAD4, SMARCA4, STK11, TP53, TSC1, TSC2, and VHL.  RNA data is routinely analyzed for use in variant interpretation for all genes.   08/13/2021 -  Chemotherapy   Patient is on Treatment Plan : BREAST Trastuzumab  + Pertuzumab q21d x 13 cycles     09/08/2021 Surgery   Right breast lumpectomy:      CURRENT THERAPY: Herceptin/Perjeta  INTERVAL HISTORY: [name] [name] 61 y.o. female returns for follow-up and evaluation prior to receiving Herceptin Perjeta.  Her most recent echocardiogram was  completed on August 09, 2021 and showed a normal left ventricular ejection fraction of 60 to 65%.    She underwent bilateral breast MRI on 07/09/2021 that showed a significant response to neoadjuvant chemotherapy with index breast carcinoma shown a reduction in size and more significant reduction enhancement now spanning less than 1 cm.  There were no other areas of abnormal breast enhancement.  No evidence of left breast malignancy was noted and there was no evidence of metastatic lymphadenopathy as well.  She is doing well today.  She has been experiencing increased diarrhea that is burning and this has been difficult for her.  She is improved, but would really like to avoid any treatments that may increase her stool output.    Patient Active Problem List   Diagnosis Date Noted   Dysuria 06/08/2021   Port-A-Cath in place 05/18/2021   Chemotherapy induced diarrhea 04/27/2021   Rash and nonspecific skin eruption 04/27/2021   UTI (urinary tract infection) 04/27/2021   Bone pain due to granulocyte colony stimulating factor 04/27/2021   Transaminitis 04/27/2021   Genetic testing 03/31/2021   Malignant neoplasm of upper-outer quadrant of right breast in female, estrogen receptor positive ([place]) 03/16/2021   History of cervical dysplasia 02/17/2021   Hot flashes due to menopause 09/16/2019   Typical atrial flutter ([place]) 01/01/2019    is allergic to bee venom.  MEDICAL HISTORY: Past Medical History:  Diagnosis Date   Abnormal bleeding in menstrual cycle    Abnormal Pap smear of vagina    Breast cancer ([place])    COVID 08/2019   Endometrial polyp    Hot flashes    Irregular  heart beats    PONV (postoperative nausea and vomiting)    Typical atrial flutter ([place]) 01/01/2019    SURGICAL HISTORY: Past Surgical History:  Procedure Laterality Date   BREAST BIOPSY Right 04/05/2021   BREAST IMPLANT REMOVAL Bilateral 2021   breast implants Right 2002   CHOLECYSTECTOMY     PORTACATH PLACEMENT  N/A 04/06/2021   Procedure: INSERTION PORT-A-CATH;  Surgeon: Stark Klein, MD;  Location: [facility];  Service: General;  Laterality: N/A;   SHOULDER SURGERY     Rotator cuff repair    SOCIAL HISTORY: Social History   Socioeconomic History   Marital status: Married    Spouse name: Not on file   Number of children: Not on file   Years of education: Not on file   Highest education level: Not on file  Occupational History   Not on file  Tobacco Use   Smoking status: Never   Smokeless tobacco: Never  Vaping Use   Vaping Use: Never used  Substance and Sexual Activity   Alcohol use: Not Currently    Alcohol/week: 14.0 standard drinks of alcohol    Types: 14 Cans of beer per week   Drug use: Not Currently   Sexual activity: Not on file  Other Topics Concern   Not on file  Social History Narrative   Not on file   Social Determinants of Health   Financial Resource Strain: Not on file  Food Insecurity: Not on file  Transportation Needs: Not on file  Physical Activity: Not on file  Stress: Not on file  Social Connections: Not on file  Intimate Partner Violence: Not on file    FAMILY HISTORY: Family History  Problem Relation Age of Onset   Heart attack Mother     Review of Systems  Constitutional:  Positive for fatigue. Negative for appetite change, chills, fever and unexpected weight change.  HENT:   Negative for hearing loss, lump/mass and trouble swallowing.   Eyes:  Negative for eye problems and icterus.  Respiratory:  Negative for chest tightness, cough and shortness of breath.   Cardiovascular:  Negative for chest pain, leg swelling and palpitations.  Gastrointestinal:  Positive for diarrhea. Negative for abdominal distention, abdominal pain, constipation, nausea and vomiting.  Endocrine: Negative for hot flashes.  Genitourinary:  Negative for difficulty urinating.   Musculoskeletal:  Negative for arthralgias.  Skin:  Negative for itching and rash.  Neurological:   Negative for dizziness, extremity weakness, headaches and numbness.  Hematological:  Negative for adenopathy. Does not bruise/bleed easily.  Psychiatric/Behavioral:  Negative for depression. The patient is not nervous/anxious.       PHYSICAL EXAMINATION  ECOG PERFORMANCE STATUS: 1 - Symptomatic but completely ambulatory  Vitals:   08/13/21 0847  BP: (!) 141/83  Pulse: 65  Temp: 97.7 F (36.5 C)  SpO2: 100%    Physical Exam Constitutional:      General: She is not in acute distress.    Appearance: Normal appearance. She is not toxic-appearing.  HENT:     Head: Normocephalic and atraumatic.  Eyes:     General: No scleral icterus. Cardiovascular:     Rate and Rhythm: Normal rate and regular rhythm.     Pulses: Normal pulses.     Heart sounds: Normal heart sounds.  Pulmonary:     Effort: Pulmonary effort is normal.     Breath sounds: Normal breath sounds.  Abdominal:     General: Abdomen is flat. Bowel sounds are normal. There is no distension.  Palpations: Abdomen is soft.     Tenderness: There is no abdominal tenderness.  Musculoskeletal:        General: No swelling.     Cervical back: Neck supple.  Lymphadenopathy:     Cervical: No cervical adenopathy.  Skin:    General: Skin is warm and dry.     Findings: No rash.  Neurological:     General: No focal deficit present.     Mental Status: She is alert.  Psychiatric:        Mood and Affect: Mood normal.        Behavior: Behavior normal.     LABORATORY DATA:  CBC    Component Value Date/Time   WBC 2.5 (L) 08/13/2021 0832   WBC 10.8 (H) 05/10/2021 1720   RBC 3.08 (L) 08/13/2021 0832   HGB 10.1 (L) 08/13/2021 0832   HGB 12.9 12/03/2018 1624   HCT 30.5 (L) 08/13/2021 0832   HCT 38.5 12/03/2018 1624   PLT 223 08/13/2021 0832   PLT 255 12/03/2018 1624   MCV 99.0 08/13/2021 0832   MCV 92 12/03/2018 1624   MCH 32.8 08/13/2021 0832   MCHC 33.1 08/13/2021 0832   RDW 15.4 08/13/2021 0832   RDW 12.3  12/03/2018 1624   LYMPHSABS 0.9 08/13/2021 0832   MONOABS 0.4 08/13/2021 0832   EOSABS 0.1 08/13/2021 0832   BASOSABS 0.0 08/13/2021 0832    CMP     Component Value Date/Time   NA 141 08/13/2021 0832   NA 143 12/03/2018 1624   K 4.0 08/13/2021 0832   CL 109 08/13/2021 0832   CO2 27 08/13/2021 0832   GLUCOSE 93 08/13/2021 0832   BUN 14 08/13/2021 0832   BUN 17 12/03/2018 1624   CREATININE 0.70 08/13/2021 0832   CALCIUM 8.8 (L) 08/13/2021 0832   PROT 5.6 (L) 08/13/2021 0832   ALBUMIN 3.7 08/13/2021 0832   AST 22 08/13/2021 0832   ALT 24 08/13/2021 0832   ALKPHOS 64 08/13/2021 0832   BILITOT 0.3 08/13/2021 0832   GFRNONAA >60 08/13/2021 0832   GFRAA 97 12/03/2018 1624       ASSESSMENT and THERAPY PLAN:   Malignant neoplasm of upper-outer quadrant of right breast in female, estrogen receptor positive ([place]) [name] [name] is here for follow-up of her stage Ib triple positive breast cancer status post neoadjuvant chemotherapy with surgery scheduled in August.  She is here today for adjuvant Herceptin.  Her most recent echo was normal.  I reviewed that she will continue on those every 3 months.  We discussed the role of her to immunotherapy in her treatment and that it will continue through March to May of 2024 depending on her surgical pathology results.  She is experiencing increased diarrhea and though this is improving she really does not want to receive anything that could potentiate her diarrhea especially prior to a planned trip she has next week with her family and prior to her surgery.  This is understandable and reasonable therefore I took the Perjeta out of her treatment plan for today.  [name] [name] will undergo breast cancer surgery on September 08, 2021 she has a follow-up with Dr. Chryl Heck on September 22, 2021.    All questions were answered. The patient knows to call the clinic with any problems, questions or concerns. We can certainly see the patient much sooner if  necessary.  Total encounter time:30 minutes*in face-to-face visit time, chart review, lab review, care coordination, order entry, and documentation of the encounter time.  Wilber Bihari, NP 08/13/21 9:49 AM Medical Oncology and Hematology [facility] [place],  [address] Tel. [phone]    Fax. [phone]  *Total Encounter Time as defined by the Centers for Medicare and Medicaid Services includes, in addition to the face-to-face time of a patient visit (documented in the note above) non-face-to-face time: obtaining and reviewing outside history, ordering and reviewing medications, tests or procedures, care coordination (communications with other health care professionals or caregivers) and documentation in the medical record.

## 2021-08-16 ENCOUNTER — Other Ambulatory Visit: Payer: Self-pay

## 2021-08-19 ENCOUNTER — Other Ambulatory Visit: Payer: Self-pay

## 2021-08-26 ENCOUNTER — Encounter: Payer: Self-pay | Admitting: Adult Health

## 2021-08-26 ENCOUNTER — Encounter: Payer: Self-pay | Admitting: Hematology and Oncology

## 2021-08-31 NOTE — Pre-Procedure Instructions (Signed)
Surgical Instructions    Your procedure is scheduled on Wednesday, August 16th.  Report to [facility] Main Entrance "A" at 11:30 A.M., then check in with the Admitting office.  Call this number if you have problems the morning of surgery:  [phone]   If you have any questions prior to your surgery date call [phone]: Open Monday-Friday 8am-4pm    Remember:  Do not eat after midnight the night before your surgery  You may drink clear liquids until 10:30 a.m. the morning of your surgery.   Clear liquids allowed are: Water, Non-Citrus Juices (without pulp), Carbonated Beverages, Clear Tea, Black Coffee ONLY (NO MILK, CREAM OR POWDERED CREAMER of any kind), and Gatorade.  Patient Instructions  The day of surgery (if you do NOT have diabetes):  Drink ONE (1) Pre-Surgery Clear Ensure by 10:30 am the morning of surgery   This drink was given to you during your hospital  pre-op appointment visit. Nothing else to drink after completing the  Pre-Surgery Clear Ensure.         If you have questions, please contact your surgeon's office.     Take these medicines the morning of surgery with A SIP OF WATER: NONE    As of today, STOP taking any Aspirin (unless otherwise instructed by your surgeon) Aleve, Naproxen, Ibuprofen, Motrin, Advil, Goody's, BC's, all herbal medications, fish oil, and all vitamins.           Do not wear jewelry or makeup. Do not wear lotions, powders, perfumes or deodorant. Do not shave 48 hours prior to surgery.   Do not bring valuables to the hospital. Do not wear nail polish, gel polish, artificial nails, or any other type of covering on natural nails (fingers and toes) If you have artificial nails or gel coating that need to be removed by a nail salon, please have this removed prior to surgery. Artificial nails or gel coating may interfere with anesthesia's ability to adequately monitor your vital signs.  [facility] is not responsible for any belongings  or valuables.    Do NOT Smoke (Tobacco/Vaping)  24 hours prior to your procedure  If you use a CPAP at night, you may bring your mask for your overnight stay.   Contacts, glasses, hearing aids, dentures or partials may not be worn into surgery, please bring cases for these belongings   For patients admitted to the hospital, discharge time will be determined by your treatment team.   Patients discharged the day of surgery will not be allowed to drive home, and someone needs to stay with them for 24 hours.   SURGICAL WAITING ROOM VISITATION Patients having surgery or a procedure may have no more than 2 support people in the waiting area - these visitors may rotate.   Children under the age of 58 must have an adult with them who is not the patient. If the patient needs to stay at the hospital during part of their recovery, the visitor guidelines for inpatient rooms apply. Pre-op nurse will coordinate an appropriate time for 1 support person to accompany patient in pre-op.  This support person may not rotate.   Please refer to the [facility] website for the visitor guidelines for Inpatients (after your surgery is over and you are in a regular room).    Special instructions:    Oral Hygiene is also important to reduce your risk of infection.  Remember - BRUSH YOUR TEETH THE MORNING OF SURGERY WITH YOUR REGULAR TOOTHPASTE  [facility]- Preparing For Surgery  Before surgery, you can play an important role. Because skin is not sterile, your skin needs to be as free of germs as possible. You can reduce the number of germs on your skin by washing with CHG (chlorahexidine gluconate) Soap before surgery.  CHG is an antiseptic cleaner which kills germs and bonds with the skin to continue killing germs even after washing.     Please do not use if you have an allergy to CHG or antibacterial soaps. If your skin becomes reddened/irritated stop using the CHG.  Do not shave (including legs and  underarms) for at least 48 hours prior to first CHG shower. It is OK to shave your face.  Please follow these instructions carefully.     Shower the NIGHT BEFORE SURGERY and the MORNING OF SURGERY with CHG Soap.   If you chose to wash your hair, wash your hair first as usual with your normal shampoo. After you shampoo, rinse your hair and body thoroughly to remove the shampoo.  Then [employer] and genitals (private parts) with your normal soap and rinse thoroughly to remove soap.  After that Use CHG Soap as you would any other liquid soap. You can apply CHG directly to the skin and wash gently with a scrungie or a clean washcloth.   Apply the CHG Soap to your body ONLY FROM THE NECK DOWN.  Do not use on open wounds or open sores. Avoid contact with your eyes, ears, mouth and genitals (private parts). Wash Face and genitals (private parts)  with your normal soap.   Wash thoroughly, paying special attention to the area where your surgery will be performed.  Thoroughly rinse your body with warm water from the neck down.  DO NOT shower/wash with your normal soap after using and rinsing off the CHG Soap.  Pat yourself dry with a CLEAN TOWEL.  Wear CLEAN PAJAMAS to bed the night before surgery  Place CLEAN SHEETS on your bed the night before your surgery  DO NOT SLEEP WITH PETS.   Day of Surgery:  Take a shower with CHG soap. Wear Clean/Comfortable clothing the morning of surgery Do not apply any deodorants/lotions.   Remember to brush your teeth WITH YOUR REGULAR TOOTHPASTE.    If you received a COVID test during your pre-op visit, it is requested that you wear a mask when out in public, stay away from anyone that may not be feeling well, and notify your surgeon if you develop symptoms. If you have been in contact with anyone that has tested positive in the last 10 days, please notify your surgeon.    Please read over the following fact sheets that you were given.

## 2021-09-01 ENCOUNTER — Other Ambulatory Visit: Payer: Self-pay

## 2021-09-01 ENCOUNTER — Encounter (HOSPITAL_COMMUNITY): Payer: Self-pay

## 2021-09-01 ENCOUNTER — Encounter (HOSPITAL_COMMUNITY)
Admission: RE | Admit: 2021-09-01 | Discharge: 2021-09-01 | Disposition: A | Discharge status: Home / Self Care | Payer: No Typology Code available for payment source | Source: Ambulatory Visit | Attending: General Surgery | Admitting: General Surgery

## 2021-09-01 VITALS — BP 144/91 | HR 75 | Temp 98.3°F | Resp 17 | Ht 62.0 in | Wt 145.9 lb

## 2021-09-01 DIAGNOSIS — Z01818 Encounter for other preprocedural examination: Secondary | ICD-10-CM

## 2021-09-01 DIAGNOSIS — I483 Typical atrial flutter: Secondary | ICD-10-CM

## 2021-09-01 DIAGNOSIS — Z01812 Encounter for preprocedural laboratory examination: Secondary | ICD-10-CM | POA: Diagnosis present

## 2021-09-01 DIAGNOSIS — I4892 Unspecified atrial flutter: Secondary | ICD-10-CM | POA: Insufficient documentation

## 2021-09-01 DIAGNOSIS — Z9221 Personal history of antineoplastic chemotherapy: Secondary | ICD-10-CM | POA: Diagnosis not present

## 2021-09-01 LAB — CBC
HCT: 36.1 % (ref 36.0–46.0)
Hemoglobin: 11.9 g/dL — ABNORMAL LOW (ref 12.0–15.0)
MCH: 32.9 pg (ref 26.0–34.0)
MCHC: 33 g/dL (ref 30.0–36.0)
MCV: 99.7 fL (ref 80.0–100.0)
Platelets: 221 10*3/uL (ref 150–400)
RBC: 3.62 MIL/uL — ABNORMAL LOW (ref 3.87–5.11)
RDW: 14.2 % (ref 11.5–15.5)
WBC: 3.7 10*3/uL — ABNORMAL LOW (ref 4.0–10.5)
nRBC: 0 % (ref 0.0–0.2)

## 2021-09-01 LAB — BASIC METABOLIC PANEL
Anion gap: 6 (ref 5–15)
BUN: 16 mg/dL (ref 8–23)
CO2: 26 mmol/L (ref 22–32)
Calcium: 9.3 mg/dL (ref 8.9–10.3)
Chloride: 107 mmol/L (ref 98–111)
Creatinine, Ser: 0.84 mg/dL (ref 0.44–1.00)
GFR, Estimated: >60 mL/min (ref >60)
Glucose, Bld: 115 mg/dL — ABNORMAL HIGH (ref 70–99)
Potassium: 3.8 mmol/L (ref 3.5–5.1)
Sodium: 139 mmol/L (ref 135–145)

## 2021-09-01 NOTE — Progress Notes (Signed)
PCP - Dr. Zack Seal Cardiologist - denies  PPM/ICD - denies   Chest x-ray - 05/10/21 EKG - 05/10/21 Stress Test - denies ECHO - 08/09/21 Cardiac Cath - denies  Sleep Study - denies   DM- denies  ASA/Blood Thinner Instructions: n/a   ERAS Protcol - yes PRE-SURGERY Ensure given at PAT  COVID TEST- n/a   Anesthesia review: yes, per order (pt has severe PONV) pt states she has "had a scopolamine patch and that combined with the use of propofol helped prevent her nausea and vomiting"  Patient denies shortness of breath, fever, cough and chest pain at PAT appointment   All instructions explained to the patient, with a verbal understanding of the material. Patient agrees to go over the instructions while at home for a better understanding.  The opportunity to ask questions was provided.

## 2021-09-02 ENCOUNTER — Other Ambulatory Visit: Payer: Self-pay | Admitting: *Deleted

## 2021-09-02 DIAGNOSIS — Z170 Estrogen receptor positive status [ER+]: Secondary | ICD-10-CM

## 2021-09-02 NOTE — Progress Notes (Signed)
Anesthesia Chart Review:  Evaluated December 2020 by EP cardiologist Dr. Caryl Comes for palpitations.  Per note, "The patient has nonsustained atrial tachycardia comprising about 1-2% of her beats.  They are minimally bothersome.  We discussed the potential that they could contribute over time to the development of atrial fibrillation if perhaps they are coming from the pulmonary veins. For now, we will discontinue her aspirin.  We will discontinue her metoprolol.  She can take it as needed.  We discussed potential contributors including stress alcohol.  We will see her again as needed."  Preop labs reviewed, unremarkable.  EKG 05/10/2021: This rhythm.  Rate 92.  TTE 08/09/21:  1. Left ventricular ejection fraction, by estimation, is 60 to 65%. The  left ventricle has normal function. The left ventricle has no regional  wall motion abnormalities. Left ventricular diastolic parameters were  normal.   2. Right ventricular systolic function is normal. The right ventricular  size is normal.   3. The mitral valve is normal in structure. No evidence of mitral valve  regurgitation. No evidence of mitral stenosis.   4. The aortic valve is tricuspid. Aortic valve regurgitation is not  visualized. No aortic stenosis is present.   5. The inferior vena cava is normal in size with greater than 50%  respiratory variability, suggesting right atrial pressure of 3 mmHg.   Event monitor 12/03/18: The patient wore the monitor for 7 days 1 hour. Indication: Palpitations The minimum heart rate was 51 bpm, maximum heart rate was  178 bpm, and average heart rate was 80 bpm. The predominant underlying rhythm was Sinus Rhythm. Atrial Flutter occurred (1% burden), ranging from 71-178 bpm (avg of 120 bpm), the longest lasting 7 mins 48 secs with an average rate of 115 bpm.  Atrial Flutter was detected within +/- 45 seconds of symptomatic patient event(s).  Premature atrial complexes were rare occasional (1.9%,  [id number]). Premature complexes ventricular were rare (<1.0%, 517). No ventricular tachycardia, No Pause, No AV blocks was present.  33 patient triggered events noted: 21 of the patient triggered events were associated with atrial flutter.     Conclusion: This study is remarkable for symptomatic atrial flutter ( there was a total of 1% burden of atrial flutter).    Wynonia Musty [facility]/Anesthesiology Phone [phone] 09/02/2021 2:27 PM

## 2021-09-02 NOTE — Anesthesia Preprocedure Evaluation (Addendum)
Anesthesia Evaluation  Patient identified by MRN, date of birth, ID band Patient awake    Reviewed: Allergy & Precautions, NPO status , Patient's Chart, lab work & pertinent test results  History of Anesthesia Complications (+) PONV and history of anesthetic complications  Airway Mallampati: I  TM Distance: >3 FB Neck ROM: Full    Dental  (+) Dental Advisory Given, Teeth Intact   Pulmonary neg pulmonary ROS,    Pulmonary exam normal        Cardiovascular Normal cardiovascular exam+ dysrhythmias (atrial tachycardia)   Normal echo 08/09/21   Neuro/Psych negative neurological ROS     GI/Hepatic negative GI ROS, Neg liver ROS,   Endo/Other  negative endocrine ROS  Renal/GU negative Renal ROS  negative genitourinary   Musculoskeletal negative musculoskeletal ROS (+)   Abdominal   Peds  Hematology  (+) Blood dyscrasia, anemia ,   Anesthesia Other Findings R breast cancer  Reproductive/Obstetrics                             Anesthesia Physical Anesthesia Plan  ASA: 2  Anesthesia Plan: General   Post-op Pain Management: Regional block*, Tylenol PO (pre-op)* and Toradol IV (intra-op)*   Induction: Intravenous  PONV Risk Score and Plan: 3 and Ondansetron, Dexamethasone, Midazolam, Treatment may vary due to age or medical condition, Scopolamine patch - Pre-op and TIVA  Airway Management Planned: LMA  Additional Equipment: None  Intra-op Plan:   Post-operative Plan: Extubation in OR  Informed Consent: I have reviewed the patients History and Physical, chart, labs and discussed the procedure including the risks, benefits and alternatives for the proposed anesthesia with the patient or authorized representative who has indicated his/her understanding and acceptance.     Dental advisory given  Plan Discussed with:   Anesthesia Plan Comments: (PAT note by Karoline Caldwell, PA-C: Evaluated  December 2020 by EP cardiologist Dr. Caryl Comes for palpitations.  Per note, "The patient has nonsustained atrial tachycardia comprising about 1-2% of her beats. They are minimally bothersome. We discussed the potential that they could contribute over time to the development of atrial fibrillation if perhaps they are coming from the pulmonary veins. For now, we will discontinue her aspirin. We will discontinue her metoprolol. She can take it as needed. We discussed potential contributors including stress alcohol. We will see her again as needed."  Preop labs reviewed, unremarkable.  EKG 05/10/2021: This rhythm.  Rate 92.  TTE 08/09/21: 1. Left ventricular ejection fraction, by estimation, is 60 to 65%. The  left ventricle has normal function. The left ventricle has no regional  wall motion abnormalities. Left ventricular diastolic parameters were  normal.  2. Right ventricular systolic function is normal. The right ventricular  size is normal.  3. The mitral valve is normal in structure. No evidence of mitral valve  regurgitation. No evidence of mitral stenosis.  4. The aortic valve is tricuspid. Aortic valve regurgitation is not  visualized. No aortic stenosis is present.  5. The inferior vena cava is normal in size with greater than 50%  respiratory variability, suggesting right atrial pressure of 3 mmHg.   Event monitor 12/03/18: The patient wore the monitor for 7 days 1 hour. Indication: Palpitations The minimum heart rate was 51 bpm, maximum heart rate was 178 bpm, and average heart rate was 80 bpm. The predominant underlying rhythm was Sinus Rhythm. Atrial Flutter occurred (1% burden), ranging from 71-178 bpm (avg of 120 bpm), the  longest lasting 7 mins 48 secs with an average rate of 115 bpm. Atrial Flutter was detected within +/- 45 seconds of symptomatic patient event(s).  Premature atrial complexes were rare occasional (1.9%, [id number]). Premature complexes ventricular were rare  (<1.0%, 517). No ventricular tachycardia, No Pause, No AV blocks was present.  33 patient triggered events noted: 21 of the patient triggered events were associated with atrial flutter.   Conclusion: This study is remarkable for symptomatic atrial flutter ( there was a total of 1% burden of atrial flutter).  )       Anesthesia Quick Evaluation

## 2021-09-03 ENCOUNTER — Inpatient Hospital Stay: Payer: No Typology Code available for payment source | Attending: Hematology and Oncology

## 2021-09-03 ENCOUNTER — Inpatient Hospital Stay (HOSPITAL_BASED_OUTPATIENT_CLINIC_OR_DEPARTMENT_OTHER): Payer: No Typology Code available for payment source | Admitting: Hematology and Oncology

## 2021-09-03 ENCOUNTER — Other Ambulatory Visit: Payer: Self-pay

## 2021-09-03 ENCOUNTER — Inpatient Hospital Stay: Payer: No Typology Code available for payment source

## 2021-09-03 ENCOUNTER — Encounter: Payer: Self-pay | Admitting: Hematology and Oncology

## 2021-09-03 DIAGNOSIS — H00016 Hordeolum externum left eye, unspecified eyelid: Secondary | ICD-10-CM | POA: Insufficient documentation

## 2021-09-03 DIAGNOSIS — M79671 Pain in right foot: Secondary | ICD-10-CM | POA: Insufficient documentation

## 2021-09-03 DIAGNOSIS — M79672 Pain in left foot: Secondary | ICD-10-CM | POA: Insufficient documentation

## 2021-09-03 DIAGNOSIS — C50411 Malignant neoplasm of upper-outer quadrant of right female breast: Secondary | ICD-10-CM | POA: Insufficient documentation

## 2021-09-03 DIAGNOSIS — Z170 Estrogen receptor positive status [ER+]: Secondary | ICD-10-CM | POA: Insufficient documentation

## 2021-09-03 DIAGNOSIS — Z95828 Presence of other vascular implants and grafts: Secondary | ICD-10-CM

## 2021-09-03 DIAGNOSIS — G629 Polyneuropathy, unspecified: Secondary | ICD-10-CM

## 2021-09-03 DIAGNOSIS — Z5112 Encounter for antineoplastic immunotherapy: Secondary | ICD-10-CM | POA: Insufficient documentation

## 2021-09-03 LAB — CBC WITH DIFFERENTIAL (CANCER CENTER ONLY)
Abs Immature Granulocytes: 0.01 10*3/uL (ref 0.00–0.07)
Basophils Absolute: 0 10*3/uL (ref 0.0–0.1)
Basophils Relative: 0 %
Eosinophils Absolute: 0.1 10*3/uL (ref 0.0–0.5)
Eosinophils Relative: 4 %
HCT: 32 % — ABNORMAL LOW (ref 36.0–46.0)
Hemoglobin: 11 g/dL — ABNORMAL LOW (ref 12.0–15.0)
Immature Granulocytes: 0 %
Lymphocytes Relative: 36 %
Lymphs Abs: 1.2 10*3/uL (ref 0.7–4.0)
MCH: 33.2 pg (ref 26.0–34.0)
MCHC: 34.4 g/dL (ref 30.0–36.0)
MCV: 96.7 fL (ref 80.0–100.0)
Monocytes Absolute: 0.4 10*3/uL (ref 0.1–1.0)
Monocytes Relative: 11 %
Neutro Abs: 1.6 10*3/uL — ABNORMAL LOW (ref 1.7–7.7)
Neutrophils Relative %: 49 %
Platelet Count: 212 10*3/uL (ref 150–400)
RBC: 3.31 MIL/uL — ABNORMAL LOW (ref 3.87–5.11)
RDW: 14.1 % (ref 11.5–15.5)
WBC Count: 3.3 10*3/uL — ABNORMAL LOW (ref 4.0–10.5)
nRBC: 0 % (ref 0.0–0.2)

## 2021-09-03 LAB — CMP (CANCER CENTER ONLY)
ALT: 24 U/L (ref 0–44)
AST: 23 U/L (ref 15–41)
Albumin: 3.9 g/dL (ref 3.5–5.0)
Alkaline Phosphatase: 77 U/L (ref 38–126)
Anion gap: 5 (ref 5–15)
BUN: 19 mg/dL (ref 8–23)
CO2: 28 mmol/L (ref 22–32)
Calcium: 8.9 mg/dL (ref 8.9–10.3)
Chloride: 109 mmol/L (ref 98–111)
Creatinine: 0.81 mg/dL (ref 0.44–1.00)
GFR, Estimated: >60 mL/min (ref >60)
Glucose, Bld: 97 mg/dL (ref 70–99)
Potassium: 4 mmol/L (ref 3.5–5.1)
Sodium: 142 mmol/L (ref 135–145)
Total Bilirubin: 0.4 mg/dL (ref 0.3–1.2)
Total Protein: 5.9 g/dL — ABNORMAL LOW (ref 6.5–8.1)

## 2021-09-03 MED ORDER — SODIUM CHLORIDE 0.9% FLUSH
10.0000 mL | Freq: Once | INTRAVENOUS | Status: AC
  Administered 2021-09-03: 10 mL

## 2021-09-03 MED ORDER — TRASTUZUMAB-ANNS CHEMO 150 MG IV SOLR
6.0000 mg/kg | Freq: Once | INTRAVENOUS | Status: AC
  Administered 2021-09-03: 399 mg via INTRAVENOUS
  Filled 2021-09-03: qty 19

## 2021-09-03 MED ORDER — HEPARIN SOD (PORK) LOCK FLUSH 100 UNIT/ML IV SOLN
500.0000 [IU] | Freq: Once | INTRAVENOUS | Status: AC | PRN
  Administered 2021-09-03: 500 [IU]

## 2021-09-03 MED ORDER — ACETAMINOPHEN 325 MG PO TABS
650.0000 mg | ORAL_TABLET | Freq: Once | ORAL | Status: AC
  Administered 2021-09-03: 650 mg via ORAL

## 2021-09-03 MED ORDER — SODIUM CHLORIDE 0.9% FLUSH
10.0000 mL | INTRAVENOUS | Status: DC | PRN
  Administered 2021-09-03: 10 mL

## 2021-09-03 MED ORDER — DIPHENHYDRAMINE HCL 25 MG PO CAPS
ORAL_CAPSULE | ORAL | Status: AC
  Filled 2021-09-03: qty 2

## 2021-09-03 MED ORDER — SODIUM CHLORIDE 0.9 % IV SOLN: Freq: Once | INTRAVENOUS | Status: AC

## 2021-09-03 MED ORDER — DIPHENHYDRAMINE HCL 25 MG PO CAPS
50.0000 mg | ORAL_CAPSULE | Freq: Once | ORAL | Status: AC
  Administered 2021-09-03: 50 mg via ORAL

## 2021-09-03 MED ORDER — ACETAMINOPHEN 325 MG PO TABS
ORAL_TABLET | ORAL | Status: AC
  Filled 2021-09-03: qty 2

## 2021-09-03 NOTE — Patient Instructions (Signed)
[facility]  Discharge Instructions: Thank you for choosing [facility] to provide your oncology and hematology care.   If you have a lab appointment with the [facility], please go directly to the [facility] and check in at the registration area.   Wear comfortable clothing and clothing appropriate for easy access to any Portacath or PICC line.   We strive to give you quality time with your provider. You may need to reschedule your appointment if you arrive late (15 or more minutes).  Arriving late affects you and other patients whose appointments are after yours.  Also, if you miss three or more appointments without notifying the office, you may be dismissed from the clinic at the provider's discretion.      For prescription refill requests, have your pharmacy contact our office and allow 72 hours for refills to be completed.    Today you received the following chemotherapy and/or immunotherapy agents: kanjinti      To help prevent nausea and vomiting after your treatment, we encourage you to take your nausea medication as directed.  BELOW ARE SYMPTOMS THAT SHOULD BE REPORTED IMMEDIATELY: *FEVER GREATER THAN 100.4 F (38 C) OR HIGHER *CHILLS OR SWEATING *NAUSEA AND VOMITING THAT IS NOT CONTROLLED WITH YOUR NAUSEA MEDICATION *UNUSUAL SHORTNESS OF BREATH *UNUSUAL BRUISING OR BLEEDING *URINARY PROBLEMS (pain or burning when urinating, or frequent urination) *BOWEL PROBLEMS (unusual diarrhea, constipation, pain near the anus) TENDERNESS IN MOUTH AND THROAT WITH OR WITHOUT PRESENCE OF ULCERS (sore throat, sores in mouth, or a toothache) UNUSUAL RASH, SWELLING OR PAIN  UNUSUAL VAGINAL DISCHARGE OR ITCHING   Items with * indicate a potential emergency and should be followed up as soon as possible or go to the Emergency Department if any problems should occur.  Please show the CHEMOTHERAPY ALERT CARD or IMMUNOTHERAPY ALERT CARD at check-in to  the Emergency Department and triage nurse.  Should you have questions after your visit or need to cancel or reschedule your appointment, please contact [facility]  Dept: [phone]  and follow the prompts.  Office hours are 8:00 a.m. to 4:30 p.m. Monday - Friday. Please note that voicemails left after 4:00 p.m. may not be returned until the following business day.  We are closed weekends and major holidays. You have access to a nurse at all times for urgent questions. Please call the main number to the clinic Dept: [phone] and follow the prompts.   For any non-urgent questions, you may also contact your provider using MyChart. We now offer e-Visits for anyone 46 and older to request care online for non-urgent symptoms. For details visit [email or web address].   Also download the MyChart app! Go to the app store, search "MyChart", open the app, select [facility], and log in with your MyChart username and password.  Masks are optional in the cancer centers. If you would like for your care team to wear a mask while they are taking care of you, please let them know. You may have one support person who is at least 63 years old accompany you for your appointments.

## 2021-09-03 NOTE — Progress Notes (Signed)
[facility] Follow up:    [name], [place] [id number]   DIAGNOSIS:  Cancer Staging  Malignant neoplasm of upper-outer quadrant of right breast in female, estrogen receptor positive ([facility]) Staging form: Breast, AJCC 8th Edition - Clinical stage from 03/17/2021: Stage IB (cT2, cN0, cM0, G2, ER+, PR+, HER2+) - Unsigned Stage prefix: Initial diagnosis Method of lymph node assessment: Clinical Histologic grading system: 3 grade system   SUMMARY OF ONCOLOGIC HISTORY: Oncology History  Malignant neoplasm of upper-outer quadrant of right breast in female, estrogen receptor positive ([facility])  03/16/2021 Initial Diagnosis   Malignant neoplasm of upper-outer quadrant of right breast in female, estrogen receptor positive ([facility])   04/07/2021 - 07/23/2021 Chemotherapy   Patient is on Treatment Plan : BREAST  Docetaxel + Carboplatin + Trastuzumab + Pertuzumab  (TCHP) q21d       Genetic Testing   [facility] CustomNext Panel was Negative. Report date is 03/28/2021.  The CustomNext-Cancer+RNAinsight panel offered by [facility] includes sequencing and rearrangement analysis for the following 47 genes:  APC, ATM, AXIN2, BARD1, BMPR1A, BRCA1, BRCA2, BRIP1, CDH1, CDK4, CDKN2A, CHEK2, CTNNA1, DICER1, EPCAM, GREM1, HOXB13, KIT, MEN1, MLH1, MSH2, MSH3, MSH6, MUTYH, NBN, NF1, NTHL1, PALB2, PDGFRA, PMS2, POLD1, POLE, PTEN, RAD50, RAD51C, RAD51D, SDHA, SDHB, SDHC, SDHD, SMAD4, SMARCA4, STK11, TP53, TSC1, TSC2, and VHL.  RNA data is routinely analyzed for use in variant interpretation for all genes.   08/13/2021 -  Chemotherapy   Patient is on Treatment Plan : BREAST Trastuzumab  + Pertuzumab q21d x 13 cycles     09/08/2021 Surgery   Right breast lumpectomy:      CURRENT THERAPY: TCHP, completed 6 cycles, currently on Herceptin and Perjeta awaiting surgery.  INTERVAL HISTORY:  Ms. Bauza is here for follow-up.  Since last visit, she is now on Herceptin  maintenance. She completed chemo end of June, had a family vacation planned in July and hence she is scheduled for surgery next week.  She did not get pertuzumab in the adjuvant setting since she wanted a break from the diarrhea.  She otherwise has slowly been getting better, still a bit fatigued but able to do all her chores and more.  She has this recurrent stye in her left eye which has been painful her for her.  No diarrhea reported today.  She has noticed some severe pain in her legs which she describes as if she hiked for whole day at the end of the day.  No burning pain.  She wants to monitor the feet pain for now. No recent urinary tract infection symptoms. Rest of the pertinent 10 point ROS reviewed and negative.   Patient Active Problem List   Diagnosis Date Noted   Dysuria 06/08/2021   Port-A-Cath in place 05/18/2021   Chemotherapy induced diarrhea 04/27/2021   Rash and nonspecific skin eruption 04/27/2021   UTI (urinary tract infection) 04/27/2021   Bone pain due to granulocyte colony stimulating factor 04/27/2021   Transaminitis 04/27/2021   Genetic testing 03/31/2021   Malignant neoplasm of upper-outer quadrant of right breast in female, estrogen receptor positive ([facility]) 03/16/2021   History of cervical dysplasia 02/17/2021   Hot flashes due to menopause 09/16/2019   Typical atrial flutter ([facility]) 01/01/2019    is allergic to bee venom.  MEDICAL HISTORY: Past Medical History:  Diagnosis Date   Abnormal bleeding in menstrual cycle    Abnormal Pap smear of vagina    Breast cancer (  [facility])    right breast   COVID 08/2019   Endometrial polyp    Hot flashes    Irregular heart beats    PONV (postoperative nausea and vomiting)    Typical atrial flutter ([facility]) 01/01/2019    SURGICAL HISTORY: Past Surgical History:  Procedure Laterality Date   BREAST BIOPSY Right 04/05/2021   BREAST IMPLANT REMOVAL Bilateral 2021   breast implants Bilateral 2002   CHOLECYSTECTOMY  1993    PORTACATH PLACEMENT N/A 04/06/2021   Procedure: INSERTION PORT-A-CATH;  Surgeon: Stark Klein, MD;  Location: [facility];  Service: General;  Laterality: N/A;   SHOULDER SURGERY Right    Rotator cuff repair    SOCIAL HISTORY: Social History   Socioeconomic History   Marital status: Married    Spouse name: Not on file   Number of children: 2   Years of education: Not on file   Highest education level: Not on file  Occupational History   Not on file  Tobacco Use   Smoking status: Never   Smokeless tobacco: Never  Vaping Use   Vaping Use: Never used  Substance and Sexual Activity   Alcohol use: Yes    Alcohol/week: 14.0 standard drinks of alcohol    Types: 14 Standard drinks or equivalent per week    Comment: 2 white claws every night with dinner   Drug use: Not Currently   Sexual activity: Not on file  Other Topics Concern   Not on file  Social History Narrative   Not on file   Social Determinants of Health   Financial Resource Strain: Not on file  Food Insecurity: Not on file  Transportation Needs: Not on file  Physical Activity: Not on file  Stress: Not on file  Social Connections: Not on file  Intimate Partner Violence: Not on file    FAMILY HISTORY: Family History  Problem Relation Age of Onset   Heart attack Mother     Review of Systems  Constitutional:  Positive for fatigue. Negative for appetite change, chills, fever and unexpected weight change.  HENT:   Negative for hearing loss, lump/mass and trouble swallowing.   Eyes:  Negative for eye problems and icterus.  Respiratory:  Negative for chest tightness, cough and shortness of breath.   Cardiovascular:  Negative for chest pain, leg swelling and palpitations.  Gastrointestinal:  Positive for diarrhea. Negative for abdominal distention, abdominal pain, constipation, nausea and vomiting.  Endocrine: Negative for hot flashes.  Genitourinary:  Negative for difficulty urinating.   Musculoskeletal:  Negative  for arthralgias.  Skin:  Negative for itching and rash.  Neurological:  Negative for dizziness, extremity weakness, headaches and numbness.  Hematological:  Negative for adenopathy. Does not bruise/bleed easily.  Psychiatric/Behavioral:  Negative for depression. The patient is not nervous/anxious.       PHYSICAL EXAMINATION  ECOG PERFORMANCE STATUS: 1 - Symptomatic but completely ambulatory  There were no vitals filed for this visit.   Physical Exam Constitutional:      General: She is not in acute distress.    Appearance: Normal appearance. She is not toxic-appearing.  HENT:     Head: Normocephalic and atraumatic.  Eyes:     General: No scleral icterus. Cardiovascular:     Rate and Rhythm: Normal rate and regular rhythm.     Heart sounds: Normal heart sounds.  Pulmonary:     Effort: Pulmonary effort is normal.     Breath sounds: Normal breath sounds.  Abdominal:     General:  Abdomen is flat. Bowel sounds are normal. There is no distension.     Palpations: Abdomen is soft.     Tenderness: There is no abdominal tenderness.  Musculoskeletal:        General: No swelling.  Lymphadenopathy:     Cervical: No cervical adenopathy.  Skin:    General: Skin is warm and dry.     Findings: No rash.  Neurological:     General: No focal deficit present.     Mental Status: She is alert.  Psychiatric:        Mood and Affect: Mood normal.        Behavior: Behavior normal.     LABORATORY DATA:  CBC    Component Value Date/Time   WBC 3.3 (L) 09/03/2021 0925   WBC 3.7 (L) 09/01/2021 1106   RBC 3.31 (L) 09/03/2021 0925   HGB 11.0 (L) 09/03/2021 0925   HGB 12.9 12/03/2018 1624   HCT 32.0 (L) 09/03/2021 0925   HCT 38.5 12/03/2018 1624   PLT 212 09/03/2021 0925   PLT 255 12/03/2018 1624   MCV 96.7 09/03/2021 0925   MCV 92 12/03/2018 1624   MCH 33.2 09/03/2021 0925   MCHC 34.4 09/03/2021 0925   RDW 14.1 09/03/2021 0925   RDW 12.3 12/03/2018 1624   LYMPHSABS 1.2 09/03/2021  0925   MONOABS 0.4 09/03/2021 0925   EOSABS 0.1 09/03/2021 0925   BASOSABS 0.0 09/03/2021 0925    CMP     Component Value Date/Time   NA 142 09/03/2021 0925   NA 143 12/03/2018 1624   K 4.0 09/03/2021 0925   CL 109 09/03/2021 0925   CO2 28 09/03/2021 0925   GLUCOSE 97 09/03/2021 0925   BUN 19 09/03/2021 0925   BUN 17 12/03/2018 1624   CREATININE 0.81 09/03/2021 0925   CALCIUM 8.9 09/03/2021 0925   PROT 5.9 (L) 09/03/2021 0925   ALBUMIN 3.9 09/03/2021 0925   AST 23 09/03/2021 0925   ALT 24 09/03/2021 0925   ALKPHOS 77 09/03/2021 0925   BILITOT 0.4 09/03/2021 0925   GFRNONAA >60 09/03/2021 0925   GFRAA 97 12/03/2018 1624     ASSESSMENT and THERAPY PLAN:   # Stage Ib Triple Positive Right Breast Cancer: -Currently receiving neoadjuvant chemotherapy with taxotere, carboplatin, herceptin and perjeta.  -She completed 6 cycles of neoadjuvant TCHP -MRI done on 07/08/2021 with significant positive response to neoadjuvant chemotherapy, no evidence of metastatic lymphadenopathy.  She is scheduled for surgery on September 08, 2021.  She is currently on maintenance Herceptin and pertuzumab, once we assess the final pathology depending on her response, she may need to continue Herceptin with pertuzumab versus Kadcyla for adjuvant anti-HER2 therapy. She is agreeable to these treatment recommendations. She will return to clinic after surgery to review final pathology.  Okay to omit pertuzumab from treatment today.  #Feet pain: -- Concerned about neuropathy. -- Feet hurt like they are hiking all day, no burning. -- We will continue to monitor.  # She also had some questions regarding her insurance billing.  Will forward the information to [facility] our authorization person for further answers.  # Recurrent stye of the left eye, recommended warm compresses.  Total time spent: 30 minutes  All questions were answered. The patient knows to call the clinic with any problems, questions or  concerns. We can certainly see the patient much sooner if necessary.  I have spent a total of 30 minutes minutes of face-to-face and non-face-to-face time, preparing to see  the patient, performing a medically appropriate examination, counseling and educating the patient, ordering medications/tests, documenting clinical information in the electronic health record,and care coordination.

## 2021-09-07 ENCOUNTER — Ambulatory Visit
Admission: RE | Admit: 2021-09-07 | Discharge: 2021-09-07 | Disposition: A | Discharge status: Home / Self Care | Payer: No Typology Code available for payment source | Source: Ambulatory Visit | Attending: General Surgery | Admitting: General Surgery

## 2021-09-07 DIAGNOSIS — C50411 Malignant neoplasm of upper-outer quadrant of right female breast: Secondary | ICD-10-CM

## 2021-09-07 DIAGNOSIS — Z170 Estrogen receptor positive status [ER+]: Secondary | ICD-10-CM

## 2021-09-07 NOTE — H&P (Signed)
PROVIDER:  Georgianne Fick, MD Patient Care Team: None as PCP - General Georgianne Fick, MD as Consulting Provider (Surgical Oncology) Iruku, Flo Shanks, MD (Hematology and Oncology) Marye Round, MD (Radiation Oncology) Radene Knee, Gwynn Burly, MD (Obstetrics and Gynecology) Brain Hilts, MD (Internal Medicine)   MRN: [id number] DOB: [birth year]  Subjective     Chief Complaint: Follow-up (Discuss breast surgery )       History of Present Illness: [name] [name] is a 61 y.o. female who is seen today for follow up of right breast cancer.    Initial history:   Pt had a new diagnosis of right breast cancer 02/2021.  She had a palpable mass.  Diagnostic imaging confirmed this and showed a 2.2 cm mass at 9 o'clock.  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  There was an enlarged node, but biopsy of node was benign and concordant.     Of note, she ad issues with h/o saline implants that were removed in 2021.  She had had capsular contractures that Dr. Dessie Coma addressed by releasing this manually in clinic.  She had them removed to eliminate need for this.  She thinks there was something there at that point in 2021, but it was felt to be scar tissue.  It is definitely more prominent.     Menarche was age 43.  She is a G2P2 with first child at age 21.  She used hormonal contraception for around 8 years.  She is post menopausal since around age 28.     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     Interval history:     Pt has had neoadjuvant chemotherapy.  Overall patient tolerated chemo relatively well.  She states that the main issue for her was the shot to stimulate her bone marrow makes her feel weak and dizzy.  Prior to each dose of chemotherapy, her energy level is always good.  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  She was able to appreciate that the tumor shrunk and disappeared  after the second dose of chemotherapy.  She did have sloughing of her skin especially on her face.  Her skin has recovered.  She is still losing some hair.   She had follow up MRI which showed a significant improvement.       MRI 07/08/2021 IMPRESSION: 1. Significant positive response to neoadjuvant chemotherapy. The index breast carcinoma shown a reduction in size and a more significant reduction in enhancement, enhancement now spanning 8 x 6 x 8 mm. 2. There are no other areas of abnormal enhancement right breast. All of the previously seen enhancement adjacent to the index carcinoma has resolved. 3. No evidence of left breast malignancy. 4. No evidence of metastatic lymphadenopathy.   RECOMMENDATION: 1. Treatment as planned for the known right breast carcinoma.   BI-RADS CATEGORY  6: Known biopsy-proven malignancy.     CBC 6/28 with WBCs 2.5k and HCT 31.0; CMET gluc 155.   Review of Systems: A complete review of systems was obtained from the patient.  I have reviewed this information and discussed as appropriate with the patient.  See HPI as well for other ROS.   Review of Systems  All other systems reviewed and are negative.      Medical History: Past Medical History      Past Medical History:  Diagnosis Date   History of cancer  Patient Active Problem List  Diagnosis   Malignant neoplasm of upper-outer quadrant of right breast in female, estrogen receptor positive (CMS-HCC)   Hot flash, menopausal      Past Surgical History       Past Surgical History:  Procedure Laterality Date   breast implants        2002   CHOLECYSTECTOMY N/A      1993   JOINT REPLACEMENT            Allergies      Allergies  Allergen Reactions   Venom-Honey Bee Swelling              Current Outpatient Medications on File Prior to Visit  Medication Sig Dispense Refill   multivitamin with minerals tablet Take 1 tablet by mouth once daily        No current  facility-administered medications on file prior to visit.      Family History       Family History  Problem Relation Age of Onset   Stroke Mother          Social History       Tobacco Use  Smoking Status Never  Smokeless Tobacco Never      Social History  Social History         Socioeconomic History   Marital status: Married  Tobacco Use   Smoking status: Never   Smokeless tobacco: Never  Vaping Use   Vaping Use: Never used  Substance and Sexual Activity   Alcohol use: Yes      Comment: "10 glasses per week"   Drug use: Not Currently        Objective:      There were no vitals filed for this visit.  There is no height or weight on file to calculate BMI.   Head:   Normocephalic and atraumatic.  Eyes:    Conjunctivae are normal. Pupils are equal, round, and reactive to light. No scleral icterus.  Neck:   Normal range of motion. Neck supple. No tracheal deviation present. No thyromegaly present.  Resp:   No respiratory distress, normal effort. Breast: right breast sl larger than left.  No palpable mass. No LAD.  No skin dimpling.  No nipple retraction.  Left breast also benign.  Abd:      Abdomen is soft, non distended and non tender. No masses are palpable.  There is no rebound and no guarding.  Neurological: Alert and oriented to person, place, and time. Coordination normal.  Skin:    Skin is warm and dry. No rash noted. No diaphoretic. No erythema. No pallor.   Psychiatric: Normal mood and affect. Normal behavior. Judgment and thought content normal.          Assessment and Plan:     Diagnoses and all orders for this visit:   Malignant neoplasm of upper-outer quadrant of right breast in female, estrogen receptor positive (CMS-HCC)   Hot flash, menopausal -     venlafaxine (EFFEXOR-XR) 37.5 MG XR capsule; Take 1 capsule (37.5 mg total) by mouth once daily     Patient would like to proceed with breast conservation.  We will place 2 seeds on the right to  bracket these 2 biopsy sites that were positive for cancer.  Because she has had neoadjuvant chemotherapy, I would do 2 tracers for her sentinel node.  I have sent a message to her oncologist to see if we can take the port out at the time  of surgery.  She does feel very weak, so we are going to wait 4 weeks for her surgery rather than 2.   She tried gabapentin for her severe hot flashes, this was ineffective so we will try Effexor.  She states that she is unable to have any good night sleep.  She was previously on hormonal treatment of the hot flashes but has stopped this after her diagnosis of cancer.   The surgical procedure was described to the patient.  I discussed the incision type and location and that we would need radiology involved on with a wire or seed marker and/or sentinel node.       The risks and benefits of the procedure were described to the patient and she wishes to proceed.     We discussed the risks bleeding, infection, damage to other structures, need for further procedures/surgeries.  We discussed the risk of seroma.  The patient was advised if the area in the breast in cancer, we may need to go back to surgery for additional tissue to obtain negative margins or for a lymph node biopsy. The patient was advised that these are the most common complications, but that others can occur as well.  They were advised against taking aspirin or other anti-inflammatory agents/blood thinners the week before surgery.       No follow-ups on file. Georgianne Fick, MD

## 2021-09-08 ENCOUNTER — Ambulatory Visit
Admission: RE | Admit: 2021-09-08 | Discharge: 2021-09-08 | Disposition: A | Discharge status: Home / Self Care | Payer: No Typology Code available for payment source | Source: Ambulatory Visit | Attending: General Surgery | Admitting: General Surgery

## 2021-09-08 ENCOUNTER — Other Ambulatory Visit: Payer: Self-pay

## 2021-09-08 ENCOUNTER — Ambulatory Visit (HOSPITAL_COMMUNITY)
Admission: RE | Admit: 2021-09-08 | Discharge: 2021-09-08 | Disposition: A | Discharge status: Home / Self Care | Payer: No Typology Code available for payment source | Attending: General Surgery | Admitting: General Surgery

## 2021-09-08 ENCOUNTER — Encounter (HOSPITAL_COMMUNITY)
Admission: RE | Disposition: A | Discharge status: Home / Self Care | Payer: Self-pay | Source: Home / Self Care | Attending: General Surgery

## 2021-09-08 ENCOUNTER — Ambulatory Visit (HOSPITAL_BASED_OUTPATIENT_CLINIC_OR_DEPARTMENT_OTHER): Payer: No Typology Code available for payment source | Admitting: Anesthesiology

## 2021-09-08 ENCOUNTER — Ambulatory Visit (HOSPITAL_COMMUNITY)
Admission: RE | Admit: 2021-09-08 | Discharge: 2021-09-08 | Disposition: A | Discharge status: Home / Self Care | Payer: No Typology Code available for payment source | Source: Ambulatory Visit | Attending: General Surgery | Admitting: General Surgery

## 2021-09-08 ENCOUNTER — Encounter (HOSPITAL_COMMUNITY): Payer: Self-pay | Admitting: General Surgery

## 2021-09-08 ENCOUNTER — Ambulatory Visit (HOSPITAL_COMMUNITY): Payer: No Typology Code available for payment source | Admitting: Vascular Surgery

## 2021-09-08 DIAGNOSIS — C50411 Malignant neoplasm of upper-outer quadrant of right female breast: Secondary | ICD-10-CM

## 2021-09-08 DIAGNOSIS — Z9221 Personal history of antineoplastic chemotherapy: Secondary | ICD-10-CM | POA: Insufficient documentation

## 2021-09-08 DIAGNOSIS — Z853 Personal history of malignant neoplasm of breast: Secondary | ICD-10-CM | POA: Insufficient documentation

## 2021-09-08 DIAGNOSIS — L905 Scar conditions and fibrosis of skin: Secondary | ICD-10-CM | POA: Diagnosis not present

## 2021-09-08 DIAGNOSIS — I471 Supraventricular tachycardia: Secondary | ICD-10-CM | POA: Insufficient documentation

## 2021-09-08 DIAGNOSIS — N951 Menopausal and female climacteric states: Secondary | ICD-10-CM | POA: Diagnosis not present

## 2021-09-08 DIAGNOSIS — Z170 Estrogen receptor positive status [ER+]: Secondary | ICD-10-CM

## 2021-09-08 HISTORY — PX: BREAST LUMPECTOMY WITH RADIOACTIVE SEED AND SENTINEL LYMPH NODE BIOPSY: SHX6550

## 2021-09-08 HISTORY — PX: BREAST LUMPECTOMY: SHX2

## 2021-09-08 SURGERY — BREAST LUMPECTOMY WITH RADIOACTIVE SEED AND SENTINEL LYMPH NODE BIOPSY
Anesthesia: General | Site: Breast | Laterality: Right

## 2021-09-08 MED ORDER — DEXAMETHASONE SODIUM PHOSPHATE 10 MG/ML IJ SOLN
4.0000 mg | INTRAMUSCULAR | Status: AC
  Administered 2021-09-08: 4 mg via INTRAVENOUS

## 2021-09-08 MED ORDER — SCOPOLAMINE 1 MG/3DAYS TD PT72: 1.0000 | MEDICATED_PATCH | TRANSDERMAL | Status: DC

## 2021-09-08 MED ORDER — ONDANSETRON HCL 4 MG/2ML IJ SOLN
INTRAMUSCULAR | Status: DC | PRN
  Administered 2021-09-08: 4 mg via INTRAVENOUS

## 2021-09-08 MED ORDER — OXYCODONE HCL 5 MG PO TABS: 5.0000 mg | ORAL_TABLET | Freq: Once | ORAL | Status: DC | PRN

## 2021-09-08 MED ORDER — CHLORHEXIDINE GLUCONATE CLOTH 2 % EX PADS: 6.0000 | MEDICATED_PAD | Freq: Once | CUTANEOUS | Status: DC

## 2021-09-08 MED ORDER — KETOROLAC TROMETHAMINE 30 MG/ML IJ SOLN
INTRAMUSCULAR | Status: DC | PRN
  Administered 2021-09-08: 30 mg via INTRAVENOUS

## 2021-09-08 MED ORDER — MAGTRACE LYMPHATIC TRACER
INTRAMUSCULAR | Status: DC | PRN
  Administered 2021-09-08: 2 mL via INTRAMUSCULAR

## 2021-09-08 MED ORDER — PROPOFOL 10 MG/ML IV BOLUS
INTRAVENOUS | Status: DC | PRN
  Administered 2021-09-08: 200 mg via INTRAVENOUS
  Administered 2021-09-08: 30 mg via INTRAVENOUS
  Administered 2021-09-08: 20 mg via INTRAVENOUS

## 2021-09-08 MED ORDER — PROPOFOL 500 MG/50ML IV EMUL
INTRAVENOUS | Status: DC | PRN
  Administered 2021-09-08: 175 ug/kg/min via INTRAVENOUS
  Administered 2021-09-08: 190 ug/kg/min via INTRAVENOUS

## 2021-09-08 MED ORDER — FENTANYL CITRATE (PF) 250 MCG/5ML IJ SOLN
INTRAMUSCULAR | Status: DC | PRN
  Administered 2021-09-08 (×2): 25 ug via INTRAVENOUS
  Administered 2021-09-08: 50 ug via INTRAVENOUS
  Administered 2021-09-08: 25 ug via INTRAVENOUS
  Administered 2021-09-08: 75 ug via INTRAVENOUS
  Administered 2021-09-08 (×2): 25 ug via INTRAVENOUS

## 2021-09-08 MED ORDER — GABAPENTIN 300 MG PO CAPS
300.0000 mg | ORAL_CAPSULE | ORAL | Status: AC
  Administered 2021-09-08: 300 mg via ORAL
  Filled 2021-09-08: qty 1

## 2021-09-08 MED ORDER — CHLORHEXIDINE GLUCONATE 0.12 % MT SOLN
15.0000 mL | Freq: Once | OROMUCOSAL | Status: AC
  Administered 2021-09-08: 15 mL via OROMUCOSAL
  Filled 2021-09-08: qty 15

## 2021-09-08 MED ORDER — LIDOCAINE 2% (20 MG/ML) 5 ML SYRINGE
INTRAMUSCULAR | Status: AC
  Filled 2021-09-08: qty 5

## 2021-09-08 MED ORDER — FENTANYL CITRATE (PF) 250 MCG/5ML IJ SOLN
INTRAMUSCULAR | Status: AC
  Filled 2021-09-08: qty 5

## 2021-09-08 MED ORDER — FENTANYL CITRATE (PF) 100 MCG/2ML IJ SOLN: 50.0000 ug | Freq: Once | INTRAMUSCULAR | Status: AC

## 2021-09-08 MED ORDER — ONDANSETRON HCL 4 MG/2ML IJ SOLN
INTRAMUSCULAR | Status: AC
  Filled 2021-09-08: qty 2

## 2021-09-08 MED ORDER — ROCURONIUM BROMIDE 10 MG/ML (PF) SYRINGE
PREFILLED_SYRINGE | INTRAVENOUS | Status: AC
  Filled 2021-09-08: qty 10

## 2021-09-08 MED ORDER — ACETAMINOPHEN 500 MG PO TABS
1000.0000 mg | ORAL_TABLET | Freq: Once | ORAL | Status: DC
  Filled 2021-09-08: qty 2

## 2021-09-08 MED ORDER — MIDAZOLAM HCL 2 MG/2ML IJ SOLN: 2.0000 mg | Freq: Once | INTRAMUSCULAR | Status: AC

## 2021-09-08 MED ORDER — FENTANYL CITRATE (PF) 100 MCG/2ML IJ SOLN
INTRAMUSCULAR | Status: AC
  Administered 2021-09-08: 50 ug via INTRAVENOUS
  Filled 2021-09-08: qty 2

## 2021-09-08 MED ORDER — ONDANSETRON HCL 4 MG/2ML IJ SOLN
4.0000 mg | Freq: Once | INTRAMUSCULAR | Status: DC | PRN
Start: 2021-09-08 — End: 2021-09-08

## 2021-09-08 MED ORDER — OXYCODONE HCL 5 MG PO TABS: 5.0000 mg | ORAL_TABLET | Freq: Two times a day (BID) | ORAL | 0 refills | Status: DC | PRN

## 2021-09-08 MED ORDER — FENTANYL CITRATE (PF) 100 MCG/2ML IJ SOLN: 25.0000 ug | INTRAMUSCULAR | Status: DC | PRN

## 2021-09-08 MED ORDER — AMISULPRIDE (ANTIEMETIC) 5 MG/2ML IV SOLN
10.0000 mg | Freq: Once | INTRAVENOUS | Status: DC | PRN
Start: 2021-09-08 — End: 2021-09-08

## 2021-09-08 MED ORDER — SCOPOLAMINE 1 MG/3DAYS TD PT72
MEDICATED_PATCH | TRANSDERMAL | Status: AC
  Administered 2021-09-08: 1.5 mg via TRANSDERMAL
  Filled 2021-09-08: qty 1

## 2021-09-08 MED ORDER — LIDOCAINE 2% (20 MG/ML) 5 ML SYRINGE
INTRAMUSCULAR | Status: DC | PRN
  Administered 2021-09-08: 40 mg via INTRAVENOUS

## 2021-09-08 MED ORDER — LACTATED RINGERS IV SOLN: INTRAVENOUS | Status: DC

## 2021-09-08 MED ORDER — OXYCODONE HCL 5 MG/5ML PO SOLN: 5.0000 mg | Freq: Once | ORAL | Status: DC | PRN

## 2021-09-08 MED ORDER — DEXAMETHASONE SODIUM PHOSPHATE 10 MG/ML IJ SOLN
INTRAMUSCULAR | Status: AC
  Filled 2021-09-08: qty 1

## 2021-09-08 MED ORDER — PROPOFOL 10 MG/ML IV BOLUS
INTRAVENOUS | Status: AC
  Filled 2021-09-08: qty 20

## 2021-09-08 MED ORDER — CEFAZOLIN SODIUM-DEXTROSE 2-4 GM/100ML-% IV SOLN
2.0000 g | INTRAVENOUS | Status: AC
  Administered 2021-09-08: 2 g via INTRAVENOUS
  Filled 2021-09-08: qty 100

## 2021-09-08 MED ORDER — ACETAMINOPHEN 500 MG PO TABS
1000.0000 mg | ORAL_TABLET | ORAL | Status: AC
  Administered 2021-09-08: 1000 mg via ORAL

## 2021-09-08 MED ORDER — ORAL CARE MOUTH RINSE: 15.0000 mL | Freq: Once | OROMUCOSAL | Status: AC

## 2021-09-08 MED ORDER — KETOROLAC TROMETHAMINE 30 MG/ML IJ SOLN
INTRAMUSCULAR | Status: AC
  Filled 2021-09-08: qty 1

## 2021-09-08 MED ORDER — ROPIVACAINE HCL 5 MG/ML IJ SOLN
INTRAMUSCULAR | Status: DC | PRN
  Administered 2021-09-08: 30 mL via PERINEURAL

## 2021-09-08 MED ORDER — MIDAZOLAM HCL 2 MG/2ML IJ SOLN
INTRAMUSCULAR | Status: AC
  Administered 2021-09-08: 2 mg via INTRAVENOUS
  Filled 2021-09-08: qty 2

## 2021-09-08 MED ORDER — BUPIVACAINE HCL (PF) 0.25 % IJ SOLN
INTRAMUSCULAR | Status: AC
  Filled 2021-09-08: qty 30

## 2021-09-08 MED ORDER — 0.9 % SODIUM CHLORIDE (POUR BTL) OPTIME
TOPICAL | Status: DC | PRN
  Administered 2021-09-08: 1000 mL

## 2021-09-08 MED ORDER — TECHNETIUM TC 99M TILMANOCEPT KIT
1.0000 | PACK | Freq: Once | INTRAVENOUS | Status: AC | PRN
  Administered 2021-09-08: 1 via INTRADERMAL

## 2021-09-08 MED ORDER — ENSURE PRE-SURGERY PO LIQD: 296.0000 mL | Freq: Once | ORAL | Status: DC

## 2021-09-08 MED ORDER — LIDOCAINE-EPINEPHRINE 1 %-1:100000 IJ SOLN
INTRAMUSCULAR | Status: AC
  Filled 2021-09-08: qty 1

## 2021-09-08 MED ORDER — LIDOCAINE HCL 1 % IJ SOLN
INTRAMUSCULAR | Status: DC | PRN
  Administered 2021-09-08: 10 mL

## 2021-09-08 SURGICAL SUPPLY — 43 items
ADH SKN CLS APL DERMABOND .7 (GAUZE/BANDAGES/DRESSINGS) ×1
APL PRP STRL LF DISP 70% ISPRP (MISCELLANEOUS) ×1
BAG COUNTER SPONGE SURGICOUNT (BAG) ×3 IMPLANT
BAG SPNG CNTER NS LX DISP (BAG) ×1
BINDER BREAST LRG (GAUZE/BANDAGES/DRESSINGS) ×1 IMPLANT
BNDG COHESIVE 4X5 TAN STRL (GAUZE/BANDAGES/DRESSINGS) ×3 IMPLANT
CANISTER SUCT 3000ML PPV (MISCELLANEOUS) ×3 IMPLANT
CHLORAPREP W/TINT 26 (MISCELLANEOUS) ×3 IMPLANT
CLIP TI LARGE 6 (CLIP) ×3 IMPLANT
CLIP TI MEDIUM 24 (CLIP) ×4 IMPLANT
CLIP TI MEDIUM 6 (CLIP) ×3 IMPLANT
CNTNR URN SCR LID CUP LEK RST (MISCELLANEOUS) IMPLANT
CONT SPEC 4OZ STRL OR WHT (MISCELLANEOUS) ×16
COVER PROBE W GEL 5X96 (DRAPES) ×4 IMPLANT
COVER SURGICAL LIGHT HANDLE (MISCELLANEOUS) ×3 IMPLANT
DERMABOND ADVANCED (GAUZE/BANDAGES/DRESSINGS) ×1
DERMABOND ADVANCED .7 DNX12 (GAUZE/BANDAGES/DRESSINGS) ×2 IMPLANT
DEVICE DUBIN SPECIMEN MAMMOGRA (MISCELLANEOUS) ×1 IMPLANT
DRAPE CHEST BREAST 15X10 FENES (DRAPES) ×3 IMPLANT
DRSG PAD ABDOMINAL 8X10 ST (GAUZE/BANDAGES/DRESSINGS) ×2 IMPLANT
ELECT COATED BLADE 2.86 ST (ELECTRODE) ×3 IMPLANT
ELECT REM PT RETURN 9FT ADLT (ELECTROSURGICAL) ×2
ELECTRODE REM PT RTRN 9FT ADLT (ELECTROSURGICAL) ×2 IMPLANT
GAUZE SPONGE 4X4 12PLY STRL (GAUZE/BANDAGES/DRESSINGS) ×1 IMPLANT
GLOVE BIO SURGEON STRL SZ 6 (GLOVE) ×3 IMPLANT
GLOVE INDICATOR 6.5 STRL GRN (GLOVE) ×3 IMPLANT
GOWN STRL REUS W/ TWL LRG LVL3 (GOWN DISPOSABLE) ×2 IMPLANT
GOWN STRL REUS W/TWL 2XL LVL3 (GOWN DISPOSABLE) ×3 IMPLANT
GOWN STRL REUS W/TWL LRG LVL3 (GOWN DISPOSABLE) ×4
KIT BASIN OR (CUSTOM PROCEDURE TRAY) ×3 IMPLANT
KIT MARKER MARGIN INK (KITS) ×3 IMPLANT
LIGHT WAVEGUIDE WIDE FLAT (MISCELLANEOUS) ×1 IMPLANT
NDL HYPO 25GX1X1/2 BEV (NEEDLE) ×2 IMPLANT
NEEDLE HYPO 25GX1X1/2 BEV (NEEDLE) ×4 IMPLANT
NS IRRIG 1000ML POUR BTL (IV SOLUTION) ×3 IMPLANT
PACK GENERAL/GYN (CUSTOM PROCEDURE TRAY) ×3 IMPLANT
PACK UNIVERSAL I (CUSTOM PROCEDURE TRAY) ×3 IMPLANT
STOCKINETTE IMPERVIOUS 9X36 MD (GAUZE/BANDAGES/DRESSINGS) ×3 IMPLANT
STRIP CLOSURE SKIN 1/2X4 (GAUZE/BANDAGES/DRESSINGS) ×1 IMPLANT
SUT MNCRL AB 4-0 PS2 18 (SUTURE) ×3 IMPLANT
SUT VIC AB 3-0 SH 8-18 (SUTURE) ×3 IMPLANT
SYR CONTROL 10ML LL (SYRINGE) ×4 IMPLANT
TOWEL GREEN STERILE (TOWEL DISPOSABLE) ×3 IMPLANT

## 2021-09-08 NOTE — Op Note (Signed)
Right Breast Radioactive seed bracketed lumpectomy and sentinel lymph node biopsy  Indications: This patient presents with history of right breast cancer, s/p neoadjuvant chemotherapy  Pre-operative Diagnosis: right breast cancer, upper outer quadrant, cT2N0M0, grade 2 invasive ductal carcinoma, +/+/+  Post-operative Diagnosis: Same  Surgeon: Stark Klein   Assist:  Malachi Pro, PA-C  Anesthesia: General endotracheal anesthesia  ASA Class: 2  Procedure Details  The patient was seen in the Holding Room. The risks, benefits, complications, treatment options, and expected outcomes were discussed with the patient. The possibilities of bleeding, infection, the need for additional procedures, failure to diagnose a condition, and creating a complication requiring transfusion or operation were discussed with the patient. The patient concurred with the proposed plan, giving informed consent.  The site of surgery properly noted/marked. The patient was taken to Operating Room # 9, identified, and the procedure verified as Right Breast Seed bracketed Lumpectomy with sentinel lymph node biopsy. A Time Out was held and the above information confirmed.  The right arm, breast, and chest were prepped and draped in standard fashion. The lumpectomy was performed by creating a transverse lateral incision near the previously placed radioactive seed.  Dissection was carried down to around the point of maximum signal intensity. The cautery was used to perform the dissection.  Hemostasis was achieved with cautery. The edges of the cavity were marked with large clips, with one each medial, lateral, inferior and superior, and two clips posteriorly.   The specimen was inked with the margin marker paint kit.    Specimen radiography confirmed inclusion of the mammographic lesion, the clip, and the seed.  The seed was close to the margin, so additional margins were taken at the superior, medial, inferior, lateral, and  posterior positions.  The background signal in the breast was zero.  The wound was irrigated and closed with 3-0 vicryl in layers and 4-0 monocryl subcuticular suture.    Using a hand-held gamma probe as well as a sentimag probe, five axillary sentinel nodes were identified transcutaneously.  An oblique incision was created below the axillary hairline.  Dissection was carried through the clavipectoral fascia.  Five level 2 deep axillary sentinel nodes were removed.  The background count was 50 cps.  The wound was irrigated.  Hemostasis was achieved with cautery.  The axillary incision was closed with a 3-0 vicryl deep dermal interrupted sutures and a 4-0 monocryl subcuticular closure.    Sterile dressings were applied. At the end of the operation, all sponge, instrument, and needle counts were correct.  Findings: grossly clear surgical margins and no adenopathy, anterior margin is skin, posterior margin is pectoralis  Estimated Blood Loss:  min         Specimens: right breast tissue with seeds and five axillary sentinel lymph nodes. 1, 2, 4, and 5 were hot with both tracers.              Complications:  None; patient tolerated the procedure well.         Disposition: PACU - hemodynamically stable.         Condition: stable

## 2021-09-08 NOTE — Anesthesia Procedure Notes (Signed)
Procedure Name: LMA Insertion Date/Time: 09/08/2021 1:20 PM  Performed by: Dorann Lodge, CRNAPre-anesthesia Checklist: Patient identified, Emergency Drugs available, Suction available and Patient being monitored Patient Re-evaluated:Patient Re-evaluated prior to induction Oxygen Delivery Method: Circle System Utilized Preoxygenation: Pre-oxygenation with 100% oxygen Induction Type: IV induction Ventilation: Mask ventilation without difficulty LMA: LMA inserted LMA Size: 4.0 Number of attempts: 1 Airway Equipment and Method: Bite block Placement Confirmation: positive ETCO2 Tube secured with: Tape Dental Injury: Teeth and Oropharynx as per pre-operative assessment

## 2021-09-08 NOTE — Transfer of Care (Signed)
Immediate Anesthesia Transfer of Care Note  Patient: [name] [name]  Procedure(s) Performed: RIGHT BREAST SEED BRACKETED LUMPECTOMY AND SENTINEL LYMPH NODE BIOPSY (Right: Breast)  Patient Location: PACU  Anesthesia Type:GA combined with regional for post-op pain  Level of Consciousness: drowsy, patient cooperative and responds to stimulation  Airway & Oxygen Therapy: Patient Spontanous Breathing  Post-op Assessment: Report given to RN and Post -op Vital signs reviewed and stable  Post vital signs: Reviewed and stable  Last Vitals:  Vitals Value Taken Time  BP 146/95 09/08/21 1519  Temp    Pulse 84 09/08/21 1522  Resp 13 09/08/21 1522  SpO2 94 % 09/08/21 1522  Vitals shown include unvalidated device data.  Last Pain:  Vitals:   09/08/21 1156  TempSrc:   PainSc: 0-No pain         Complications: No notable events documented.

## 2021-09-08 NOTE — Anesthesia Postprocedure Evaluation (Signed)
Anesthesia Post Note  Patient: Tymira Horkey  Procedure(s) Performed: RIGHT BREAST SEED BRACKETED LUMPECTOMY AND SENTINEL LYMPH NODE BIOPSY (Right: Breast)     Patient location during evaluation: PACU Anesthesia Type: General Level of consciousness: awake and alert Pain management: pain level controlled Vital Signs Assessment: post-procedure vital signs reviewed and stable Respiratory status: spontaneous breathing, nonlabored ventilation and respiratory function stable Cardiovascular status: blood pressure returned to baseline and stable Postop Assessment: no apparent nausea or vomiting Anesthetic complications: no   No notable events documented.  Last Vitals:  Vitals:   09/08/21 1535 09/08/21 1550  BP: (!) 142/86 (!) 140/95  Pulse: 80 75  Resp: 20 15  Temp:  36.5 C  SpO2: 94% 97%    Last Pain:  Vitals:   09/08/21 1550  TempSrc:   PainSc: 0-No pain                 Lidia Collum

## 2021-09-08 NOTE — Discharge Instructions (Addendum)
[facility] Office Phone Number [phone]  BREAST BIOPSY/ PARTIAL MASTECTOMY: POST OP INSTRUCTIONS  Always review your discharge instruction sheet given to you by the facility where your surgery was performed.  IF YOU HAVE DISABILITY OR FAMILY LEAVE FORMS, YOU MUST BRING THEM TO THE OFFICE FOR PROCESSING.  DO NOT GIVE THEM TO YOUR DOCTOR.  Take 2 tylenol (acetominophen) three times a day for 3 days.  If you still have pain, add ibuprofen with food in between if able to take this (if you have kidney issues or stomach issues, do not take ibuprofen).  If both of those are not enough, add the narcotic pain pill.  If you find you are needing a lot of this overnight after surgery, call the next morning for a refill.    Prescriptions will not be filled after 5pm or on week-ends. Take your usually prescribed medications unless otherwise directed You should eat very light the first 24 hours after surgery, such as soup, crackers, pudding, etc.  Resume your normal diet the day after surgery. Most patients will experience some swelling and bruising in the breast.  Ice packs and a good support bra will help.  Swelling and bruising can take several days to resolve.  It is common to experience some constipation if taking pain medication after surgery.  Increasing fluid intake and taking a stool softener will usually help or prevent this problem from occurring.  A mild laxative (Milk of Magnesia or Miralax) should be taken according to package directions if there are no bowel movements after 48 hours. Unless discharge instructions indicate otherwise, you may remove your bandages 48 hours after surgery, and you may shower at that time.  You may have steri-strips (small skin tapes) in place directly over the incision.  These strips should be left on the skin at least for for 7-10 days.    ACTIVITIES:  You may resume regular daily activities (gradually increasing) beginning the next day.  Wearing a  good support bra or sports bra (or the breast binder) minimizes pain and swelling.  You may have sexual intercourse when it is comfortable. No heavy lifting for 1-2 weeks (not over around 10 pounds).  You may drive when you no longer are taking prescription pain medication, you can comfortably wear a seatbelt, and you can safely maneuver your car and apply brakes. RETURN TO WORK:  __________3-14 days depending on job. _______________ You should see your doctor in the office for a follow-up appointment approximately two weeks after your surgery.  Your doctor's nurse will typically make your follow-up appointment when she calls you with your pathology report.  Expect your pathology report 3-4 business days after your surgery.  You may call to check if you do not hear from us after three days.   WHEN TO CALL YOUR DOCTOR: Fever over 101.0 Nausea and/or vomiting. Extreme swelling or bruising. Continued bleeding from incision. Increased pain, redness, or drainage from the incision.  The clinic staff is available to answer your questions during regular business hours.  Please don't hesitate to call and ask to speak to one of the nurses for clinical concerns.  If you have a medical emergency, go to the nearest emergency room or call 911.  A surgeon from [facility] is always on call at the hospital.  For further questions, please visit [email or web address]   

## 2021-09-08 NOTE — Interval H&P Note (Signed)
History and Physical Interval Note:  09/08/2021 12:24 PM  [name] [name]  has presented today for surgery, with the diagnosis of RIGHT BREAST CANCER.  The various methods of treatment have been discussed with the patient and family. After consideration of risks, benefits and other options for treatment, the patient has consented to  Procedure(s) with comments: RIGHT BREAST SEED BRACKETED LUMPECTOMY AND SENTINEL LYMPH NODE BIOPSY (Right) - GEN & PEC BLOCK as a surgical intervention.  [name] history has been reviewed, patient examined, no change in status, stable for surgery.  I have reviewed the patient's chart and labs.  Questions were answered to the patient's satisfaction.     Stark Klein

## 2021-09-08 NOTE — Anesthesia Procedure Notes (Signed)
Anesthesia Regional Block: Pectoralis block   Pre-Anesthetic Checklist: , timeout performed,  Correct Patient, Correct Site, Correct Laterality,  Correct Procedure, Correct Position, site marked,  Risks and benefits discussed,  Surgical consent,  Pre-op evaluation,  At surgeon's request and post-op pain management  Laterality: Right  Prep: chloraprep       Needles:  Injection technique: Single-shot  Needle Type: Echogenic Stimulator Needle     Needle Length: 10cm  Needle Gauge: 20     Additional Needles:   Procedures:,,,, ultrasound used (permanent image in chart),,    Narrative:  Start time: 09/08/2021 12:40 PM End time: 09/08/2021 12:43 PM Injection made incrementally with aspirations every 5 mL.  Performed by: Personally  Anesthesiologist: Lidia Collum, MD

## 2021-09-09 ENCOUNTER — Encounter (HOSPITAL_COMMUNITY): Payer: Self-pay | Admitting: General Surgery

## 2021-09-10 LAB — SURGICAL PATHOLOGY

## 2021-09-14 ENCOUNTER — Encounter: Payer: Self-pay | Admitting: *Deleted

## 2021-09-14 DIAGNOSIS — C50411 Malignant neoplasm of upper-outer quadrant of right female breast: Secondary | ICD-10-CM

## 2021-09-15 ENCOUNTER — Telehealth: Payer: Self-pay | Admitting: Radiation Oncology

## 2021-09-15 NOTE — Telephone Encounter (Signed)
8/23 @ 9:55 am Left voicemail for patient to call our office.

## 2021-09-20 ENCOUNTER — Encounter: Payer: Self-pay | Admitting: *Deleted

## 2021-09-22 ENCOUNTER — Ambulatory Visit: Payer: No Typology Code available for payment source | Admitting: Hematology and Oncology

## 2021-09-24 ENCOUNTER — Inpatient Hospital Stay: Payer: No Typology Code available for payment source

## 2021-09-24 ENCOUNTER — Other Ambulatory Visit: Payer: Self-pay | Admitting: Hematology and Oncology

## 2021-09-24 ENCOUNTER — Other Ambulatory Visit: Payer: Self-pay

## 2021-09-24 ENCOUNTER — Inpatient Hospital Stay: Payer: No Typology Code available for payment source | Admitting: Adult Health

## 2021-09-24 DIAGNOSIS — Z170 Estrogen receptor positive status [ER+]: Secondary | ICD-10-CM

## 2021-09-27 ENCOUNTER — Encounter: Payer: Self-pay | Admitting: Hematology and Oncology

## 2021-09-27 ENCOUNTER — Encounter: Payer: Self-pay | Admitting: Adult Health

## 2021-09-28 ENCOUNTER — Encounter: Payer: Self-pay | Admitting: Hematology and Oncology

## 2021-09-28 ENCOUNTER — Inpatient Hospital Stay: Payer: No Typology Code available for payment source

## 2021-09-28 ENCOUNTER — Other Ambulatory Visit: Payer: Self-pay

## 2021-09-28 ENCOUNTER — Inpatient Hospital Stay
Payer: No Typology Code available for payment source | Attending: Hematology and Oncology | Admitting: Hematology and Oncology

## 2021-09-28 VITALS — BP 156/88 | HR 72 | Resp 17

## 2021-09-28 VITALS — BP 156/97 | HR 86 | Temp 97.7°F | Resp 16 | Ht 62.0 in | Wt 148.8 lb

## 2021-09-28 DIAGNOSIS — Z170 Estrogen receptor positive status [ER+]: Secondary | ICD-10-CM

## 2021-09-28 DIAGNOSIS — Z5111 Encounter for antineoplastic chemotherapy: Secondary | ICD-10-CM | POA: Insufficient documentation

## 2021-09-28 DIAGNOSIS — C50411 Malignant neoplasm of upper-outer quadrant of right female breast: Secondary | ICD-10-CM | POA: Diagnosis not present

## 2021-09-28 DIAGNOSIS — Z5112 Encounter for antineoplastic immunotherapy: Secondary | ICD-10-CM | POA: Diagnosis present

## 2021-09-28 DIAGNOSIS — Z95828 Presence of other vascular implants and grafts: Secondary | ICD-10-CM

## 2021-09-28 LAB — CMP (CANCER CENTER ONLY)
ALT: 11 U/L (ref 0–44)
AST: 15 U/L (ref 15–41)
Albumin: 4 g/dL (ref 3.5–5.0)
Alkaline Phosphatase: 68 U/L (ref 38–126)
Anion gap: 6 (ref 5–15)
BUN: 20 mg/dL (ref 8–23)
CO2: 28 mmol/L (ref 22–32)
Calcium: 9.5 mg/dL (ref 8.9–10.3)
Chloride: 106 mmol/L (ref 98–111)
Creatinine: 0.79 mg/dL (ref 0.44–1.00)
GFR, Estimated: >60 mL/min (ref >60)
Glucose, Bld: 96 mg/dL (ref 70–99)
Potassium: 4.2 mmol/L (ref 3.5–5.1)
Sodium: 140 mmol/L (ref 135–145)
Total Bilirubin: 0.3 mg/dL (ref 0.3–1.2)
Total Protein: 6.4 g/dL — ABNORMAL LOW (ref 6.5–8.1)

## 2021-09-28 LAB — CBC WITH DIFFERENTIAL (CANCER CENTER ONLY)
Abs Immature Granulocytes: 0.01 10*3/uL (ref 0.00–0.07)
Basophils Absolute: 0 10*3/uL (ref 0.0–0.1)
Basophils Relative: 1 %
Eosinophils Absolute: 0.2 10*3/uL (ref 0.0–0.5)
Eosinophils Relative: 5 %
HCT: 33.8 % — ABNORMAL LOW (ref 36.0–46.0)
Hemoglobin: 11.5 g/dL — ABNORMAL LOW (ref 12.0–15.0)
Immature Granulocytes: 0 %
Lymphocytes Relative: 29 %
Lymphs Abs: 1.1 10*3/uL (ref 0.7–4.0)
MCH: 32.4 pg (ref 26.0–34.0)
MCHC: 34 g/dL (ref 30.0–36.0)
MCV: 95.2 fL (ref 80.0–100.0)
Monocytes Absolute: 0.5 10*3/uL (ref 0.1–1.0)
Monocytes Relative: 12 %
Neutro Abs: 2 10*3/uL (ref 1.7–7.7)
Neutrophils Relative %: 53 %
Platelet Count: 238 10*3/uL (ref 150–400)
RBC: 3.55 MIL/uL — ABNORMAL LOW (ref 3.87–5.11)
RDW: 12.5 % (ref 11.5–15.5)
WBC Count: 3.8 10*3/uL — ABNORMAL LOW (ref 4.0–10.5)
nRBC: 0 % (ref 0.0–0.2)

## 2021-09-28 MED ORDER — ACETAMINOPHEN 325 MG PO TABS
650.0000 mg | ORAL_TABLET | Freq: Once | ORAL | Status: AC
  Administered 2021-09-28: 650 mg via ORAL
  Filled 2021-09-28: qty 2

## 2021-09-28 MED ORDER — SODIUM CHLORIDE 0.9 % IV SOLN
420.0000 mg | Freq: Once | INTRAVENOUS | Status: AC
  Administered 2021-09-28: 420 mg via INTRAVENOUS
  Filled 2021-09-28: qty 14

## 2021-09-28 MED ORDER — TRASTUZUMAB-ANNS CHEMO 150 MG IV SOLR
6.0000 mg/kg | Freq: Once | INTRAVENOUS | Status: AC
  Administered 2021-09-28: 399 mg via INTRAVENOUS
  Filled 2021-09-28: qty 19

## 2021-09-28 MED ORDER — SODIUM CHLORIDE 0.9 % IV SOLN: Freq: Once | INTRAVENOUS | Status: AC

## 2021-09-28 MED ORDER — DOXYCYCLINE HYCLATE 100 MG PO TABS: 100.0000 mg | ORAL_TABLET | Freq: Two times a day (BID) | ORAL | 0 refills | Status: DC

## 2021-09-28 MED ORDER — SODIUM CHLORIDE 0.9% FLUSH
10.0000 mL | INTRAVENOUS | Status: DC | PRN
  Administered 2021-09-28: 10 mL

## 2021-09-28 MED ORDER — HEPARIN SOD (PORK) LOCK FLUSH 100 UNIT/ML IV SOLN
500.0000 [IU] | Freq: Once | INTRAVENOUS | Status: AC | PRN
  Administered 2021-09-28: 500 [IU]

## 2021-09-28 MED ORDER — TRASTUZUMAB-DKST CHEMO 150 MG IV SOLR: 6.0000 mg/kg | Freq: Once | INTRAVENOUS | Status: DC

## 2021-09-28 MED ORDER — DIPHENHYDRAMINE HCL 25 MG PO CAPS
50.0000 mg | ORAL_CAPSULE | Freq: Once | ORAL | Status: AC
  Administered 2021-09-28: 50 mg via ORAL
  Filled 2021-09-28: qty 2

## 2021-09-28 MED ORDER — SODIUM CHLORIDE 0.9% FLUSH
10.0000 mL | Freq: Once | INTRAVENOUS | Status: AC
  Administered 2021-09-28: 10 mL

## 2021-09-28 NOTE — Addendum Note (Signed)
Addended by: Tora Kindred on: 09/28/2021 04:09 PM   Modules accepted: Orders

## 2021-09-28 NOTE — Progress Notes (Signed)
The following biosimilar Kanjinti (trastuzumab-anns) has been selected for use in this patient.   Ammi Hutt D. Ketura Sirek, PharmD  

## 2021-09-28 NOTE — Progress Notes (Signed)
[facility] Follow up:    [name] [name], [place] [id number]   DIAGNOSIS:  Cancer Staging  Malignant neoplasm of upper-outer quadrant of right breast in female, estrogen receptor positive ([place]) Staging form: Breast, AJCC 8th Edition - Clinical stage from 03/17/2021: Stage IB (cT2, cN0, cM0, G2, ER+, PR+, HER2+) - Unsigned Stage prefix: Initial diagnosis Method of lymph node assessment: Clinical Histologic grading system: 3 grade system   SUMMARY OF ONCOLOGIC HISTORY: Oncology History  Malignant neoplasm of upper-outer quadrant of right breast in female, estrogen receptor positive ([place])  03/16/2021 Initial Diagnosis   Malignant neoplasm of upper-outer quadrant of right breast in female, estrogen receptor positive ([place])   04/07/2021 - 07/23/2021 Chemotherapy   Patient is on Treatment Plan : BREAST  Docetaxel + Carboplatin + Trastuzumab + Pertuzumab  (TCHP) q21d       Genetic Testing   [facility] CustomNext Panel was Negative. Report date is 03/28/2021.  The CustomNext-Cancer+RNAinsight panel offered by [facility] includes sequencing and rearrangement analysis for the following 47 genes:  APC, ATM, AXIN2, BARD1, BMPR1A, BRCA1, BRCA2, BRIP1, CDH1, CDK4, CDKN2A, CHEK2, CTNNA1, DICER1, EPCAM, GREM1, HOXB13, KIT, MEN1, MLH1, MSH2, MSH3, MSH6, MUTYH, NBN, NF1, NTHL1, PALB2, PDGFRA, PMS2, POLD1, POLE, PTEN, RAD50, RAD51C, RAD51D, SDHA, SDHB, SDHC, SDHD, SMAD4, SMARCA4, STK11, TP53, TSC1, TSC2, and VHL.  RNA data is routinely analyzed for use in variant interpretation for all genes.   08/13/2021 - 09/03/2021 Chemotherapy   Patient is on Treatment Plan : BREAST Trastuzumab  + Pertuzumab q21d x 13 cycles     08/13/2021 -  Chemotherapy   Patient is on Treatment Plan : BREAST Trastuzumab  + Pertuzumab q21d x 13 cycles     09/08/2021 Surgery   Right breast lumpectomy:      CURRENT THERAPY: TCHP, completed 6 cycles, currently on Herceptin and  Perjeta awaiting surgery.  INTERVAL HISTORY:  [name] [name] is here for follow-up.   She is now status post surgery.  Last Friday, she started feeling a bit unwell, felt an episode of warmth but no fevers or chills.  She says she may also have overused her arm, had some lawnmowing as well as some other physical activity at home.  Yesterday she started noticing some fluid dripping down her breast surgical wound which was serosanguineous.  She did not notice any foul odor to it but then she has limitations since she had COVID, lost a lot of her olfactory sensation.  She otherwise feels well.   Rest of the pertinent 10 point ROS reviewed and negative.   Patient Active Problem List   Diagnosis Date Noted   Dysuria 06/08/2021   Port-A-Cath in place 05/18/2021   Chemotherapy induced diarrhea 04/27/2021   Rash and nonspecific skin eruption 04/27/2021   UTI (urinary tract infection) 04/27/2021   Bone pain due to granulocyte colony stimulating factor 04/27/2021   Transaminitis 04/27/2021   Genetic testing 03/31/2021   Malignant neoplasm of upper-outer quadrant of right breast in female, estrogen receptor positive ([place]) 03/16/2021   History of cervical dysplasia 02/17/2021   Hot flashes due to menopause 09/16/2019   Typical atrial flutter ([place]) 01/01/2019    is allergic to bee venom.  MEDICAL HISTORY: Past Medical History:  Diagnosis Date   Abnormal bleeding in menstrual cycle    Abnormal Pap smear of vagina    Breast cancer ([place])    right breast   COVID 08/2019   Endometrial polyp  Hot flashes    Irregular heart beats    PONV (postoperative nausea and vomiting)    Typical atrial flutter ([place]) 01/01/2019    SURGICAL HISTORY: Past Surgical History:  Procedure Laterality Date   BREAST BIOPSY Right 04/05/2021   BREAST IMPLANT REMOVAL Bilateral 2021   breast implants Bilateral 2002   BREAST LUMPECTOMY WITH RADIOACTIVE SEED AND SENTINEL LYMPH NODE BIOPSY Right 09/08/2021   Procedure:  RIGHT BREAST SEED BRACKETED LUMPECTOMY AND SENTINEL LYMPH NODE BIOPSY;  Surgeon: Stark Klein, MD;  Location: [facility];  Service: General;  Laterality: Right;  [place] N/A 04/06/2021   Procedure: INSERTION PORT-A-CATH;  Surgeon: Stark Klein, MD;  Location: [facility];  Service: General;  Laterality: N/A;   SHOULDER SURGERY Right    Rotator cuff repair    SOCIAL HISTORY: Social History   Socioeconomic History   Marital status: Married    Spouse name: Not on file   Number of children: 2   Years of education: Not on file   Highest education level: Not on file  Occupational History   Not on file  Tobacco Use   Smoking status: Never   Smokeless tobacco: Never  Vaping Use   Vaping Use: Never used  Substance and Sexual Activity   Alcohol use: Yes    Alcohol/week: 14.0 standard drinks of alcohol    Types: 14 Standard drinks or equivalent per week    Comment: 2 white claws every night with dinner   Drug use: Not Currently   Sexual activity: Not on file  Other Topics Concern   Not on file  Social History Narrative   Not on file   Social Determinants of Health   Financial Resource Strain: Not on file  Food Insecurity: Not on file  Transportation Needs: Not on file  Physical Activity: Not on file  Stress: Not on file  Social Connections: Not on file  Intimate Partner Violence: Not on file    FAMILY HISTORY: Family History  Problem Relation Age of Onset   Heart attack Mother     Review of Systems  Constitutional:  Positive for fatigue. Negative for appetite change, chills, fever and unexpected weight change.  HENT:   Negative for hearing loss, lump/mass and trouble swallowing.   Eyes:  Negative for eye problems and icterus.  Respiratory:  Negative for chest tightness, cough and shortness of breath.   Cardiovascular:  Negative for chest pain, leg swelling and palpitations.  Gastrointestinal:  Positive for diarrhea. Negative  for abdominal distention, abdominal pain, constipation, nausea and vomiting.  Endocrine: Negative for hot flashes.  Genitourinary:  Negative for difficulty urinating.   Musculoskeletal:  Negative for arthralgias.  Skin:  Negative for itching and rash.  Neurological:  Negative for dizziness, extremity weakness, headaches and numbness.  Hematological:  Negative for adenopathy. Does not bruise/bleed easily.  Psychiatric/Behavioral:  Negative for depression. The patient is not nervous/anxious.       PHYSICAL EXAMINATION  ECOG PERFORMANCE STATUS: 1 - Symptomatic but completely ambulatory  Vitals:   09/28/21 0953  BP: (!) 156/97  Pulse: 86  Resp: 16  Temp: 97.7 F (36.5 C)  SpO2: 100%     Physical Exam Constitutional:      General: She is not in acute distress.    Appearance: Normal appearance. She is not toxic-appearing.  HENT:     Head: Normocephalic and atraumatic.  Eyes:     General: No scleral icterus. Cardiovascular:  Rate and Rhythm: Normal rate and regular rhythm.     Heart sounds: Normal heart sounds.  Pulmonary:     Effort: Pulmonary effort is normal.     Breath sounds: Normal breath sounds.  Abdominal:     General: Abdomen is flat. Bowel sounds are normal. There is no distension.     Palpations: Abdomen is soft.     Tenderness: There is no abdominal tenderness.  Musculoskeletal:        General: No swelling.  Lymphadenopathy:     Cervical: No cervical adenopathy.  Skin:    General: Skin is warm and dry.     Findings: No rash.  Neurological:     General: No focal deficit present.     Mental Status: She is alert.  Psychiatric:        Mood and Affect: Mood normal.        Behavior: Behavior normal.     LABORATORY DATA:  CBC    Component Value Date/Time   WBC 3.8 (L) 09/28/2021 0945   WBC 3.7 (L) 09/01/2021 1106   RBC 3.55 (L) 09/28/2021 0945   HGB 11.5 (L) 09/28/2021 0945   HGB 12.9 12/03/2018 1624   HCT 33.8 (L) 09/28/2021 0945   HCT 38.5  12/03/2018 1624   PLT 238 09/28/2021 0945   PLT 255 12/03/2018 1624   MCV 95.2 09/28/2021 0945   MCV 92 12/03/2018 1624   MCH 32.4 09/28/2021 0945   MCHC 34.0 09/28/2021 0945   RDW 12.5 09/28/2021 0945   RDW 12.3 12/03/2018 1624   LYMPHSABS 1.1 09/28/2021 0945   MONOABS 0.5 09/28/2021 0945   EOSABS 0.2 09/28/2021 0945   BASOSABS 0.0 09/28/2021 0945    CMP     Component Value Date/Time   NA 140 09/28/2021 0945   NA 143 12/03/2018 1624   K 4.2 09/28/2021 0945   CL 106 09/28/2021 0945   CO2 28 09/28/2021 0945   GLUCOSE 96 09/28/2021 0945   BUN 20 09/28/2021 0945   BUN 17 12/03/2018 1624   CREATININE 0.79 09/28/2021 0945   CALCIUM 9.5 09/28/2021 0945   PROT 6.4 (L) 09/28/2021 0945   ALBUMIN 4.0 09/28/2021 0945   AST 15 09/28/2021 0945   ALT 11 09/28/2021 0945   ALKPHOS 68 09/28/2021 0945   BILITOT 0.3 09/28/2021 0945   GFRNONAA >60 09/28/2021 0945   GFRAA 97 12/03/2018 1624     ASSESSMENT and THERAPY PLAN:   # Stage Ib Triple Positive Right Breast Cancer: -Currently receiving neoadjuvant chemotherapy with taxotere, carboplatin, herceptin and perjeta.  -She completed 6 cycles of neoadjuvant TCHP -MRI done on 07/08/2021 with significant positive response to neoadjuvant chemotherapy, no evidence of metastatic lymphadenopathy.   She is status post right partial mastectomy on August 16.  Pathology showed complete pathologic response.  We have today discussed about continuing Herceptin and pertuzumab for the rest of the year to complete total 1 year of maintenance. She should have an echocardiogram every 3 months, last echocardiogram done in July. She does appear to have some postop seroma with leakage from the edge of the breast wound.  The fluid is serosanguineous, no purulence or foul smell noted.  However since she reported an episode of feeling warm, I have given her antibiotics for 1 week.  She will also benefit some drainage.  We have tried to contact Dr. Marlowe Aschoff office  for this. I have recommended that she try to limit activities with her right arm for now.  Total time  spent: 30 minutes including history, physical exam, review of records, counseling and coordination of care  All questions were answered. The patient knows to call the clinic with any problems, questions or concerns. We can certainly see the patient much sooner if necessary.  I have spent a total of 30 minutes minutes of face-to-face and non-face-to-face time, preparing to see the patient, performing a medically appropriate examination, counseling and educating the patient, ordering medications/tests, documenting clinical information in the electronic health record,and care coordination.

## 2021-09-28 NOTE — Patient Instructions (Signed)
[facility] MEDICAL ONCOLOGY  Discharge Instructions: Thank you for choosing [facility] to provide your oncology and hematology care.   If you have a lab appointment with the Cancer Center, please go directly to the Cancer Center and check in at the registration area.   Wear comfortable clothing and clothing appropriate for easy access to any Portacath or PICC line.   We strive to give you quality time with your provider. You may need to reschedule your appointment if you arrive late (15 or more minutes).  Arriving late affects you and other patients whose appointments are after yours.  Also, if you miss three or more appointments without notifying the office, you may be dismissed from the clinic at the provider's discretion.      For prescription refill requests, have your pharmacy contact our office and allow 72 hours for refills to be completed.    Today you received the following chemotherapy and/or immunotherapy agents: trastuzumab, pertuzumab      To help prevent nausea and vomiting after your treatment, we encourage you to take your nausea medication as directed.  BELOW ARE SYMPTOMS THAT SHOULD BE REPORTED IMMEDIATELY: *FEVER GREATER THAN 100.4 F (38 C) OR HIGHER *CHILLS OR SWEATING *NAUSEA AND VOMITING THAT IS NOT CONTROLLED WITH YOUR NAUSEA MEDICATION *UNUSUAL SHORTNESS OF BREATH *UNUSUAL BRUISING OR BLEEDING *URINARY PROBLEMS (pain or burning when urinating, or frequent urination) *BOWEL PROBLEMS (unusual diarrhea, constipation, pain near the anus) TENDERNESS IN MOUTH AND THROAT WITH OR WITHOUT PRESENCE OF ULCERS (sore throat, sores in mouth, or a toothache) UNUSUAL RASH, SWELLING OR PAIN  UNUSUAL VAGINAL DISCHARGE OR ITCHING   Items with * indicate a potential emergency and should be followed up as soon as possible or go to the Emergency Department if any problems should occur.  Please show the CHEMOTHERAPY ALERT CARD or IMMUNOTHERAPY ALERT CARD  at check-in to the Emergency Department and triage nurse.  Should you have questions after your visit or need to cancel or reschedule your appointment, please contact [facility]  Dept: [phone]  and follow the prompts.  Office hours are 8:00 a.m. to 4:30 p.m. Monday - Friday. Please note that voicemails left after 4:00 p.m. may not be returned until the following business day.  We are closed weekends and major holidays. You have access to a nurse at all times for urgent questions. Please call the main number to the clinic Dept: [phone] and follow the prompts.   For any non-urgent questions, you may also contact your provider using MyChart. We now offer e-Visits for anyone 18 and older to request care online for non-urgent symptoms. For details visit [email or web address].   Also download the MyChart app! Go to the app store, search "MyChart", open the app, select , and log in with your MyChart username and password.  Masks are optional in the cancer centers. If you would like for your care team to wear a mask while they are taking care of you, please let them know. You may have one support person who is at least 63 years old accompany you for your appointments. 

## 2021-10-04 NOTE — Progress Notes (Signed)
Radiation Oncology         [phone]) [phone] ________________________________  Name: [name] [name]        MRN: [id number]  Date of Service: 10/07/2021 DOB: 01-19-1961  CC:[name], MD  Benay Pike, MD     REFERRING PHYSICIAN: Benay Pike, MD   DIAGNOSIS: The encounter diagnosis was Malignant neoplasm of upper-outer quadrant of right breast in female, estrogen receptor positive ([place]).   HISTORY OF PRESENT ILLNESS: [name] [name] is a 61 y.o. female originally seen in the multidisciplinary breast clinic for a new diagnosis of right breast cancer. The patient was noted to have a palpable mass in the right breast. Of note she had previous saline implants removed about a year ago. The palpable area by diagnostic imaging meausred 2.2 cm in the 9:00 position of the right breast and one abnormal appearing axillary node. She underwent biopsies. Her node was benign and concordant. 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   She began chemotherapy on 04/07/2021 through 07/21/2021 and since continues on targeted HER2 therapy.  Initially her MRI on 04/01/2021 showed a 2.3 cm area in the right breast consistent with her known cancer and non-mass enhancement altogether measuring 3.2 cm with 2 small foci of enhancement anterior and medial to the inferior aspect of the dominant mass.  No abnormal appearing lymph nodes were noted in either axilla but biopsy related changes were seen in the lateral right breast.  Her post treatment MRI on 07/08/2021 showed positive response to therapy with a significant reduction in size but the enhancement was still seen measuring 8 mm.  No other abnormalities were seen in the left breast or in any of the lymphatics.  She underwent right lumpectomy with sentinel lymph node biopsy on 09/08/2021 and final pathology shows scar with organized hemorrhage in the segment of the breast with no identifiable residual  disease.  Additional margins were all submitted showing negative findings for malignancy.  5 sentinel lymph nodes were submitted and all were negative for disease as well.  She is seen today to discuss treatment recommendations for adjuvant radiotherapy.   PREVIOUS RADIATION THERAPY: No   PAST MEDICAL HISTORY:  Past Medical History:  Diagnosis Date   Abnormal bleeding in menstrual cycle    Abnormal Pap smear of vagina    Breast cancer (HCC)    right breast   COVID 08/2019   Endometrial polyp    Hot flashes    Irregular heart beats    PONV (postoperative nausea and vomiting)    Typical atrial flutter ([place]) 01/01/2019       PAST SURGICAL HISTORY: Past Surgical History:  Procedure Laterality Date   BREAST BIOPSY Right 04/05/2021   BREAST IMPLANT REMOVAL Bilateral 2021   breast implants Bilateral 2002   BREAST LUMPECTOMY WITH RADIOACTIVE SEED AND SENTINEL LYMPH NODE BIOPSY Right 09/08/2021   Procedure: RIGHT BREAST SEED BRACKETED LUMPECTOMY AND SENTINEL LYMPH NODE BIOPSY;  Surgeon: Stark Klein, MD;  Location: [facility];  Service: General;  Laterality: Right;  [place] N/A 04/06/2021   Procedure: INSERTION PORT-A-CATH;  Surgeon: Stark Klein, MD;  Location: [place];  Service: General;  Laterality: N/A;   SHOULDER SURGERY Right    Rotator cuff repair     FAMILY HISTORY:  Family History  Problem Relation Age of Onset   Heart attack Mother  SOCIAL HISTORY:  reports that she has never smoked. She has never used smokeless tobacco. She reports current alcohol use of about 14.0 standard drinks of alcohol per week. She reports that she does not currently use drugs. The patient is married and lives in [place].  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  ALLERGIES: Bee venom   MEDICATIONS:  Current Outpatient Medications  Medication Sig Dispense Refill   acetaminophen (TYLENOL) 500 MG tablet Take 1,000 mg by mouth  every 6 (six) hours as needed for moderate pain.     Cholecalciferol (VITAMIN D3) 125 MCG (5000 UT) CAPS Take 10,000 Units by mouth daily.     ciprofloxacin (CIPRO) 500 MG tablet Take 1 tablet (500 mg total) by mouth 2 (two) times daily. (Patient not taking: Reported on 08/13/2021) 14 tablet 0   diphenoxylate-atropine (LOMOTIL) 2.5-0.025 MG tablet Take 1 tablet by mouth 4 (four) times daily as needed for diarrhea or loose stools. (Patient not taking: Reported on 08/13/2021) 30 tablet 0   doxycycline (VIBRA-TABS) 100 MG tablet Take 1 tablet (100 mg total) by mouth 2 (two) times daily. 14 tablet 0   fluconazole (DIFLUCAN) 100 MG tablet Take 1 tablet (100 mg total) by mouth daily. Take 2 tablets with first dose then 1 tab daily (Patient not taking: Reported on 08/13/2021) 8 tablet 0   lidocaine-prilocaine (EMLA) cream Apply 1 Application topically as needed (port access).     Multiple Vitamin (MULTIVITAMIN WITH MINERALS) TABS tablet Take 1 tablet by mouth daily.     Nutritional Supplements (FRUIT & VEGETABLE DAILY) CAPS Take 1 capsule by mouth daily.     oxyCODONE (OXY IR/ROXICODONE) 5 MG immediate release tablet Take 1 tablet (5 mg total) by mouth every 12 (twelve) hours as needed for severe pain. 20 tablet 0   Probiotic Product (PROBIOTIC DAILY PO) Take 1 tablet by mouth daily.      No current facility-administered medications for this visit.   Facility-Administered Medications Ordered in Other Visits  Medication Dose Route Frequency Provider Last Rate Last Admin   acetaminophen (TYLENOL) 325 MG tablet            diphenhydrAMINE (BENADRYL) 25 mg capsule              REVIEW OF SYSTEMS: On review of systems, the patient reports that she is doing okay but has had a harder healing course than expected due to seroma requiring aspiration. She was seen earlier this week and being referred to PT but has not heard from them.      PHYSICAL EXAM:  Wt Readings from Last 3 Encounters:  09/28/21 148 lb 12.8  oz (67.5 kg)  09/08/21 144 lb (65.3 kg)  09/01/21 145 lb 14.4 oz (66.2 kg)   Temp Readings from Last 3 Encounters:  09/28/21 97.7 F (36.5 C) (Temporal)  09/08/21 97.7 F (36.5 C)  09/01/21 98.3 F (36.8 C) (Oral)   BP Readings from Last 3 Encounters:  09/28/21 (!) 156/88  09/28/21 (!) 156/97  09/08/21 (!) 140/95   Pulse Readings from Last 3 Encounters:  09/28/21 72  09/28/21 86  09/08/21 75    In general this is a well appearing caucasian female in no acute distress. She's alert and oriented x4 and appropriate throughout the examination. Cardiopulmonary assessment is negative for acute distress and she exhibits normal effort. The right breast shows a well-healed surgical incision site, similar findings were also seen in the axilla.  The breast site however has fluctuance consistent  with seroma. Both incisions are intact without separation, bleeding, or erythema.    ECOG = 1  0 - Asymptomatic (Fully active, able to carry on all predisease activities without restriction)  1 - Symptomatic but completely ambulatory (Restricted in physically strenuous activity but ambulatory and able to carry out work of a light or sedentary nature. For example, light housework, office work)  2 - Symptomatic, <50% in bed during the day (Ambulatory and capable of all self care but unable to carry out any work activities. Up and about more than 50% of waking hours)  3 - Symptomatic, >50% in bed, but not bedbound (Capable of only limited self-care, confined to bed or chair 50% or more of waking hours)  4 - Bedbound (Completely disabled. Cannot carry on any self-care. Totally confined to bed or chair)  5 - Death   Eustace Pen MM, Creech RH, Tormey DC, et al. [phone]). "Toxicity and response criteria of the [facility]". [place] Oncol. 5 (6): 649-55    LABORATORY DATA:  Lab Results  Component Value Date   WBC 3.8 (L) 09/28/2021   HGB 11.5 (L) 09/28/2021   HCT 33.8 (L)  09/28/2021   MCV 95.2 09/28/2021   PLT 238 09/28/2021   Lab Results  Component Value Date   NA 140 09/28/2021   K 4.2 09/28/2021   CL 106 09/28/2021   CO2 28 09/28/2021   Lab Results  Component Value Date   ALT 11 09/28/2021   AST 15 09/28/2021   ALKPHOS 68 09/28/2021   BILITOT 0.3 09/28/2021      RADIOGRAPHY: MM Breast Surgical Specimen  Result Date: 09/08/2021 CLINICAL DATA:  Post lumpectomy specimen radiograph EXAM: SPECIMEN RADIOGRAPH OF THE RIGHT BREAST COMPARISON:  Previous exam(s). FINDINGS: Status post excision of the right breast. Both radioactive seeds and biopsy marker clips are present and completely intact and were marked for pathology. IMPRESSION: Specimen radiograph of the right breast. Electronically Signed   By: Audie Pinto M.D.   On: 09/08/2021 14:09  NM Sentinel Node Inj-No Rpt (Breast)  Result Date: 09/08/2021 Sulfur Colloid was injected by the Nuclear Medicine Technologist for sentinel lymph node localization.   MM RT RADIO SEED EA ADD LESION LOC MAMMO  Result Date: 09/07/2021 CLINICAL DATA:  61 year old female for radioactive seed localization/bracketing of RIGHT breast cancer. EXAM: MAMMOGRAPHIC GUIDED RADIOACTIVE SEED LOCALIZATION OF THE RIGHT BREAST X 2 COMPARISON:  Previous exam(s). FINDINGS: Patient presents for radioactive seed localization prior to RIGHT lumpectomy. I met with the patient and we discussed the procedures of seed localization including benefits and alternatives. We discussed the high likelihood of a successful procedure. We discussed the risks of the procedure including infection, bleeding, tissue injury and further surgery. We discussed the low dose of radioactivity involved in the procedure. Informed, written consent was given. The usual time-out protocol was performed immediately prior to the procedures. MAMMOGRAPHIC GUIDED RADIOACTIVE SEED LOCALIZATION OF THE RIGHT BREAST (RIBBON clip): Using mammographic guidance, sterile technique,  1% lidocaine and an I-125 radioactive seed, the RIBBON biopsy clip was localized using a LATERAL approach. The follow-up mammogram images confirm the seed in the expected location and were marked for Dr. Barry Dienes. Follow-up survey of the patient confirms presence of the radioactive seed. Order number of I-125 seed:  [id number]. Total activity:  4.431 millicuries.  Reference Date: 07/30/2021. MAMMOGRAPHIC GUIDED RADIOACTIVE SEED LOCALIZATION OF THE RIGHT BREAST (BARBELL clip): Using mammographic guidance, sterile technique, 1% lidocaine and an I-125 radioactive seed, the BARBELL biopsy  clip was localized using a LATERAL approach. The follow-up mammogram images confirm the seed in the expected location (3 mm INFERIOR to the BARBELL clip) and were marked for Dr. Barry Dienes. Follow-up survey of the patient confirms presence of the radioactive seed. Order number of I-125 seed:  [id number]. Total activity:  0.865 millicuries.  Reference Date: 07/30/2021. The patient tolerated the procedure well and was released from the Breast Center. She was given instructions regarding seed removal. IMPRESSION: Two separate radioactive seed localizations of the RIGHT breast. The radioactive seed associated with the BARBELL clip lies 3 mm INFERIOR to the BARBELL clip. No apparent complications. Electronically Signed   By: Margarette Canada M.D.   On: 09/07/2021 14:40  MM RT RADIOACTIVE SEED LOC MAMMO GUIDE  Result Date: 09/07/2021 CLINICAL DATA:  61 year old female for radioactive seed localization/bracketing of RIGHT breast cancer. EXAM: MAMMOGRAPHIC GUIDED RADIOACTIVE SEED LOCALIZATION OF THE RIGHT BREAST X 2 COMPARISON:  Previous exam(s). FINDINGS: Patient presents for radioactive seed localization prior to RIGHT lumpectomy. I met with the patient and we discussed the procedures of seed localization including benefits and alternatives. We discussed the high likelihood of a successful procedure. We discussed the risks of the procedure including  infection, bleeding, tissue injury and further surgery. We discussed the low dose of radioactivity involved in the procedure. Informed, written consent was given. The usual time-out protocol was performed immediately prior to the procedures. MAMMOGRAPHIC GUIDED RADIOACTIVE SEED LOCALIZATION OF THE RIGHT BREAST (RIBBON clip): Using mammographic guidance, sterile technique, 1% lidocaine and an I-125 radioactive seed, the RIBBON biopsy clip was localized using a LATERAL approach. The follow-up mammogram images confirm the seed in the expected location and were marked for Dr. Barry Dienes. Follow-up survey of the patient confirms presence of the radioactive seed. Order number of I-125 seed:  [id number]. Total activity:  2.841 millicuries.  Reference Date: 07/30/2021. MAMMOGRAPHIC GUIDED RADIOACTIVE SEED LOCALIZATION OF THE RIGHT BREAST (BARBELL clip): Using mammographic guidance, sterile technique, 1% lidocaine and an I-125 radioactive seed, the BARBELL biopsy clip was localized using a LATERAL approach. The follow-up mammogram images confirm the seed in the expected location (3 mm INFERIOR to the BARBELL clip) and were marked for Dr. Barry Dienes. Follow-up survey of the patient confirms presence of the radioactive seed. Order number of I-125 seed:  [id number]. Total activity:  2.536 millicuries.  Reference Date: 07/30/2021. The patient tolerated the procedure well and was released from the Breast Center. She was given instructions regarding seed removal. IMPRESSION: Two separate radioactive seed localizations of the RIGHT breast. The radioactive seed associated with the BARBELL clip lies 3 mm INFERIOR to the BARBELL clip. No apparent complications. Electronically Signed   By: Margarette Canada M.D.   On: 09/07/2021 14:40      IMPRESSION/PLAN: 1. Stage IB, cT2N0M0, grade 2, triple positive invasive ductal carcinoma of the right breast. Dr. Lisbeth Renshaw has reviewed the final pathology findings and today I reviewed her course and the nature  of early stage breast disease.  She has done well since systemic chemotherapy and continues anti-HER2 treatment.  She is also done well since surgery and is now ready for  adjuvant external radiotherapy to the breast  to reduce risks of local recurrence followed by antiestrogen therapy. We discussed the risks, benefits, short, and long term effects of radiotherapy, as well as the curative intent, and the patient is interested in proceeding.  I reviewed the delivery and logistics of radiotherapy and a that Dr. Lisbeth Renshaw recommends 4 weeks of radiotherapy to the right  breast.  Written consent is obtained and placed in the chart, a copy was provided to the patient. She will be rescheduled for simulation due to ongoing seroma.  2 Seroma. We will refer her to PT for urgent evaluation. She may benefit from a compression bra and foam insert and we will reschedule simulation to next week. She knows to call if her symptoms have not improved prior to next week's simulation appointment which would delay planning.     In a visit lasting 45 minutes, greater than 50% of the time was spent face to face reviewing her case, as well as in preparation of, discussing, and coordinating the patient's care.      Carola Rhine, [facility]    **Disclaimer: This note was dictated with voice recognition software. Similar sounding words can inadvertently be transcribed and this note may contain transcription errors which may not have been corrected upon publication of note.**

## 2021-10-07 ENCOUNTER — Ambulatory Visit: Payer: No Typology Code available for payment source | Admitting: Radiation Oncology

## 2021-10-07 ENCOUNTER — Encounter: Payer: Self-pay | Admitting: Hematology and Oncology

## 2021-10-07 ENCOUNTER — Encounter: Payer: Self-pay | Admitting: Adult Health

## 2021-10-07 ENCOUNTER — Ambulatory Visit
Admission: RE | Admit: 2021-10-07 | Discharge: 2021-10-07 | Disposition: A | Discharge status: Home / Self Care | Payer: No Typology Code available for payment source | Source: Ambulatory Visit | Attending: Radiation Oncology | Admitting: Radiation Oncology

## 2021-10-07 ENCOUNTER — Encounter: Payer: Self-pay | Admitting: Radiation Oncology

## 2021-10-07 VITALS — BP 150/78 | HR 86 | Temp 97.9°F | Resp 17 | Ht 62.0 in | Wt 146.1 lb

## 2021-10-07 DIAGNOSIS — Z8616 Personal history of COVID-19: Secondary | ICD-10-CM | POA: Diagnosis not present

## 2021-10-07 DIAGNOSIS — R232 Flushing: Secondary | ICD-10-CM | POA: Diagnosis not present

## 2021-10-07 DIAGNOSIS — C50411 Malignant neoplasm of upper-outer quadrant of right female breast: Secondary | ICD-10-CM | POA: Diagnosis present

## 2021-10-07 DIAGNOSIS — M96843 Postprocedural seroma of a musculoskeletal structure following other procedure: Secondary | ICD-10-CM | POA: Insufficient documentation

## 2021-10-07 DIAGNOSIS — Z79899 Other long term (current) drug therapy: Secondary | ICD-10-CM | POA: Diagnosis not present

## 2021-10-07 DIAGNOSIS — Z170 Estrogen receptor positive status [ER+]: Secondary | ICD-10-CM | POA: Insufficient documentation

## 2021-10-07 NOTE — Telephone Encounter (Signed)
No entry 

## 2021-10-07 NOTE — Progress Notes (Addendum)
Consult appointment. I verified patient's identity and began nursing interview. Patient reports cording of the RT arm w/o pain, but is otherwise doing well. Seroma healing nicely and ABX's are helping.  Meaningful use complete. Postmenopausal- NO chance of pregnancy.  BP (!) 150/78 (BP Location: Left Arm, Patient Position: Sitting, Cuff Size: Normal)   Pulse 86   Temp 97.9 F (36.6 C) (Oral)   Resp 17   Ht '5\' 2"'$  (1.575 m)   Wt 146 lb 2 oz (66.3 kg)   LMP 09/09/2019   SpO2 99%   BMI 26.73 kg/m

## 2021-10-08 ENCOUNTER — Other Ambulatory Visit: Payer: Self-pay

## 2021-10-08 ENCOUNTER — Ambulatory Visit: Payer: No Typology Code available for payment source | Attending: Radiation Oncology | Admitting: Rehabilitation

## 2021-10-08 ENCOUNTER — Encounter: Payer: Self-pay | Admitting: Rehabilitation

## 2021-10-08 DIAGNOSIS — I890 Lymphedema, not elsewhere classified: Secondary | ICD-10-CM | POA: Diagnosis not present

## 2021-10-08 DIAGNOSIS — R600 Localized edema: Secondary | ICD-10-CM | POA: Diagnosis not present

## 2021-10-08 DIAGNOSIS — C50411 Malignant neoplasm of upper-outer quadrant of right female breast: Secondary | ICD-10-CM | POA: Diagnosis present

## 2021-10-08 DIAGNOSIS — M79601 Pain in right arm: Secondary | ICD-10-CM | POA: Diagnosis not present

## 2021-10-08 DIAGNOSIS — M25611 Stiffness of right shoulder, not elsewhere classified: Secondary | ICD-10-CM | POA: Diagnosis not present

## 2021-10-08 DIAGNOSIS — Z170 Estrogen receptor positive status [ER+]: Secondary | ICD-10-CM | POA: Diagnosis not present

## 2021-10-08 NOTE — Therapy (Signed)
OUTPATIENT PHYSICAL THERAPY ONCOLOGY EVALUATION  Patient Name: [name] [name] MRN: [id number] DOB:07-13-1960, 61 y.o., female Today's Date: 10/08/2021   PT End of Session - 10/08/21 0956     Visit Number 1    Number of Visits 9    Date for PT Re-Evaluation 11/12/21    PT Start Time 0905    PT Stop Time 0955    PT Time Calculation (min) 50 min    Activity Tolerance Patient tolerated treatment well    Behavior During Therapy [facility] for tasks assessed/performed             Past Medical History:  Diagnosis Date   Abnormal bleeding in menstrual cycle    Abnormal Pap smear of vagina    Breast cancer ([place])    right breast   COVID 08/2019   Endometrial polyp    Hot flashes    Irregular heart beats    PONV (postoperative nausea and vomiting)    Typical atrial flutter ([place]) 01/01/2019   Past Surgical History:  Procedure Laterality Date   BREAST BIOPSY Right 04/05/2021   BREAST IMPLANT REMOVAL Bilateral 2021   breast implants Bilateral 2002   BREAST LUMPECTOMY WITH RADIOACTIVE SEED AND SENTINEL LYMPH NODE BIOPSY Right 09/08/2021   Procedure: RIGHT BREAST SEED BRACKETED LUMPECTOMY AND SENTINEL LYMPH NODE BIOPSY;  Surgeon: Stark Klein, MD;  Location: [facility];  Service: General;  Laterality: Right;  [place] N/A 04/06/2021   Procedure: INSERTION PORT-A-CATH;  Surgeon: Stark Klein, MD;  Location: [place];  Service: General;  Laterality: N/A;   SHOULDER SURGERY Right    Rotator cuff repair   Patient Active Problem List   Diagnosis Date Noted   Dysuria 06/08/2021   Port-A-Cath in place 05/18/2021   Chemotherapy induced diarrhea 04/27/2021   Rash and nonspecific skin eruption 04/27/2021   UTI (urinary tract infection) 04/27/2021   Bone pain due to granulocyte colony stimulating factor 04/27/2021   Transaminitis 04/27/2021   Genetic testing 03/31/2021   Malignant neoplasm of upper-outer quadrant of right breast in female,  estrogen receptor positive ([place]) 03/16/2021   History of cervical dysplasia 02/17/2021   Hot flashes due to menopause 09/16/2019   Typical atrial flutter ([place]) 01/01/2019    PCP: Zack Seal, MD  REFERRING PROVIDER: Shona Simpson PA-C  REFERRING DIAG: Rt breast cancer     THERAPY DIAG:  Malignant neoplasm of upper-outer quadrant of right breast in female, estrogen receptor positive ([place])  Stiffness of right shoulder, not elsewhere classified  Pain in right arm  Localized edema  ONSET DATE: 02/2021  Rationale for Evaluation and Treatment Rehabilitation  SUBJECTIVE  SUBJECTIVE STATEMENT: I have this seroma that they have drained twice now with 2 antibiotics.  They have delayed the radiation SIM until next week.    PERTINENT HISTORY:  Patient was diagnosed on 03/04/2021 with right grade II invasive ductal carcinoma breast cancer. It measures 2.2 cm and is located in the upper outer quadrant. It is triple positive with a Ki67 of 25%.  She has a history of breast implants and had a right rotator cuff repair in 2019. Neoadjuvant chemotherapy. Rt lumpectomy and SLNB on 09/08/21 with 5 negative nodes removed.  Drainage noted from incision 09/28/21 with infection. Will be having radiation.    PAIN:  Are you having pain? Yes - It is just uncomfortable  PRECAUTIONS: Rt lymphedema risk  WEIGHT BEARING RESTRICTIONS No  FALLS:  Has patient fallen in last 6 months? 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   PRIOR LEVEL OF FUNCTION: Independent  PATIENT GOALS get the cording and seroma gone   OBJECTIVE UPPER EXTREMITY  AROM/PROM:   A/PROM RIGHT  03/17/2021   10/08/21  Shoulder extension 58   Shoulder flexion 144 130 - cording evident  Shoulder abduction 165 115 - cording evident   Shoulder internal rotation 82   Shoulder external rotation 78                           (Blank rows = not tested)   A/PROM LEFT  03/17/2021 10/08/21  Shoulder extension 53   Shoulder flexion 144   Shoulder abduction 161   Shoulder internal rotation 77   Shoulder external rotation 90                           (Blank rows = not tested)    CERVICAL AROM: All within normal limits    LYMPHEDEMA ASSESSMENTS:    LANDMARK RIGHT  03/17/2021 10/08/21  10 cm proximal to olecranon process 28.9 29  Olecranon process 24._0 cm proximal to ulnar styloid process 23.1 23.1  Just proximal to ulnar styloid process 15.6 15.6  Across hand at thumb web space 18.2 18.3  At base of 2nd digit 6 6  (Blank rows = not tested)   [facility] LEFT  03/17/2021 10/08/21  10 cm proximal to olecranon process 29.2 29  Olecranon process 24.8 24.8  10 cm proximal to ulnar styloid process 22.3 21.3  Just proximal to ulnar styloid process 15.7 15.5  Across hand at thumb web space 18.2 18.3  At base of 2nd digit 6 6  (Blank rows = not tested)    COGNITION:  Overall cognitive status: Within functional limits for tasks assessed   PALPATION: Increased scar tissue under axillary incision, seroma anterior breast, cording from axilla to mid forearm - 2 in antecubital fossa  OBSERVATIONS / OTHER ASSESSMENTS: cording noted in the Rt axilla into forearm , well healed incisions, brusing at breast  POSTURE: WNL  QUICK DASH SURVEY: 34% from 0%   TODAY'S TREATMENT  Date:  HEP established-see below  Cording release and PROM Rt UE using cross hands gentle pull, and cocoa butter massage along length Updated HEP with performance - declined new pictures Gave bra prescription - pt is wearing binder   PATIENT EDUCATION:  Education details: POC, cording,  HEP Person educated: Patient Education method: Explanation, Demonstration, Tactile cues, Verbal cues, and Handouts Education comprehension: verbalized understanding, returned demonstration, verbal cues required, and needs further education   HOME EXERCISE PROGRAM: Post -op performed supine for flexion and chest stretch and standing median nerve flossing  ASSESSMENT:  CLINICAL IMPRESSION: Patient is a 61 y.o. female who was seen today for physical therapy evaluation and treatment for her Rt UE cording.  Pt was doing fine postoperatively but developed a seroma and infection that has been drained x 2 and now new cording.  .    OBJECTIVE IMPAIRMENTS decreased activity tolerance, decreased knowledge of use of DME, decreased ROM, decreased strength, and increased edema.   ACTIVITY LIMITATIONS carrying and lifting  PARTICIPATION LIMITATIONS: cleaning, laundry, occupation, and yard work  PERSONAL FACTORS 1 comorbidity: SLNB  are also affecting patient's functional outcome.   REHAB POTENTIAL: Excellent  CLINICAL DECISION MAKING: Stable/uncomplicated  EVALUATION COMPLEXITY: Low  GOALS: Goals reviewed with patient? Yes   LONG TERM GOALS: [place] date: 11/05/2021    Pt will return to baseline AROM Baseline:  Goal status: INITIAL  2.  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  Pt will obtain compression bra for seroma and radiation Baseline:  Goal status: INITIAL  PLAN: PT FREQUENCY: 2x/week  PT DURATION: 4 weeks  PLANNED INTERVENTIONS: Therapeutic exercises, Patient/Family education, Self Care, DME instructions, Manual therapy, and Re-evaluation  PLAN FOR NEXT SESSION: Rt UE PROM, cording work, pulleys   Stark Bray, PT 10/08/2021, 9:57 AM

## 2021-10-09 ENCOUNTER — Other Ambulatory Visit: Payer: Self-pay

## 2021-10-12 ENCOUNTER — Ambulatory Visit: Payer: No Typology Code available for payment source

## 2021-10-12 DIAGNOSIS — M25611 Stiffness of right shoulder, not elsewhere classified: Secondary | ICD-10-CM

## 2021-10-12 DIAGNOSIS — M79601 Pain in right arm: Secondary | ICD-10-CM

## 2021-10-12 DIAGNOSIS — C50411 Malignant neoplasm of upper-outer quadrant of right female breast: Secondary | ICD-10-CM

## 2021-10-12 DIAGNOSIS — R600 Localized edema: Secondary | ICD-10-CM

## 2021-10-12 NOTE — Therapy (Signed)
OUTPATIENT PHYSICAL THERAPY ONCOLOGY TREATMENT NOTE  Patient Name: [name] [name] MRN: [id number] DOB:January 16, 1961, 61 y.o., female Today's Date: 10/12/2021   PT End of Session - 10/12/21 0902     Visit Number 2    Number of Visits 9    Date for PT Re-Evaluation 11/12/21    PT Start Time 0859    PT Stop Time 0959    PT Time Calculation (min) 60 min    Activity Tolerance Patient tolerated treatment well    Behavior During Therapy [facility] for tasks assessed/performed             Past Medical History:  Diagnosis Date   Abnormal bleeding in menstrual cycle    Abnormal Pap smear of vagina    Breast cancer ([place])    right breast   COVID 08/2019   Endometrial polyp    Hot flashes    Irregular heart beats    PONV (postoperative nausea and vomiting)    Typical atrial flutter ([place]) 01/01/2019   Past Surgical History:  Procedure Laterality Date   BREAST BIOPSY Right 04/05/2021   BREAST IMPLANT REMOVAL Bilateral 2021   breast implants Bilateral 2002   BREAST LUMPECTOMY WITH RADIOACTIVE SEED AND SENTINEL LYMPH NODE BIOPSY Right 09/08/2021   Procedure: RIGHT BREAST SEED BRACKETED LUMPECTOMY AND SENTINEL LYMPH NODE BIOPSY;  Surgeon: Stark Klein, MD;  Location: [place];  Service: General;  Laterality: Right;  [place] N/A 04/06/2021   Procedure: INSERTION PORT-A-CATH;  Surgeon: Stark Klein, MD;  Location: [place];  Service: General;  Laterality: N/A;   SHOULDER SURGERY Right    Rotator cuff repair   Patient Active Problem List   Diagnosis Date Noted   Dysuria 06/08/2021   Port-A-Cath in place 05/18/2021   Chemotherapy induced diarrhea 04/27/2021   Rash and nonspecific skin eruption 04/27/2021   UTI (urinary tract infection) 04/27/2021   Bone pain due to granulocyte colony stimulating factor 04/27/2021   Transaminitis 04/27/2021   Genetic testing 03/31/2021   Malignant neoplasm of upper-outer quadrant of right breast in female,  estrogen receptor positive ([place]) 03/16/2021   History of cervical dysplasia 02/17/2021   Hot flashes due to menopause 09/16/2019   Typical atrial flutter ([place]) 01/01/2019    PCP: Zack Seal, MD  REFERRING PROVIDER: Shona Simpson PA-C  REFERRING DIAG: Rt breast cancer     THERAPY DIAG:  Malignant neoplasm of upper-outer quadrant of right breast in female, estrogen receptor positive ([place])  Stiffness of right shoulder, not elsewhere classified  Pain in right arm  Localized edema  ONSET DATE: 02/2021  Rationale for Evaluation and Treatment Rehabilitation  SUBJECTIVE  SUBJECTIVE STATEMENT: I've been using the compression bra and that does seem to help, especially with the foam. It really flattened the incision, I was impressed.   PERTINENT HISTORY:  Patient was diagnosed on 03/04/2021 with right grade II invasive ductal carcinoma breast cancer. It measures 2.2 cm and is located in the upper outer quadrant. It is triple positive with a Ki67 of 25%.  She has a history of breast implants and had a right rotator cuff repair in 2019. Neoadjuvant chemotherapy. Rt lumpectomy and SLNB on 09/08/21 with 5 negative nodes removed.  Drainage noted from incision 09/28/21 with infection. Will be having radiation.    PAIN:  Are you having pain? Yes - It is just uncomfortable  PRECAUTIONS: Rt lymphedema risk  WEIGHT BEARING RESTRICTIONS No  FALLS:  Has patient fallen in last 6 months? 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   PRIOR LEVEL OF FUNCTION: Independent  PATIENT GOALS get the cording and seroma gone   OBJECTIVE UPPER  EXTREMITY AROM/PROM:   A/PROM RIGHT  03/17/2021   10/08/21  Shoulder extension 58   Shoulder flexion 144 130 - cording evident  Shoulder abduction 165 115 - cording evident   Shoulder internal rotation 82   Shoulder external rotation 78                           (Blank rows = not tested)   A/PROM LEFT  03/17/2021 10/08/21  Shoulder extension 53   Shoulder flexion 144   Shoulder abduction 161   Shoulder internal rotation 77   Shoulder external rotation 90                           (Blank rows = not tested)    CERVICAL AROM: All within normal limits    LYMPHEDEMA ASSESSMENTS:    LANDMARK RIGHT  03/17/2021 10/08/21  10 cm proximal to olecranon process 28.9 29  Olecranon process 24._0 cm proximal to ulnar styloid process 23.1 23.1  Just proximal to ulnar styloid process 15.6 15.6  Across hand at thumb web space 18.2 18.3  At base of 2nd digit 6 6  (Blank rows = not tested)   [facility] LEFT  03/17/2021 10/08/21  10 cm proximal to olecranon process 29.2 29  Olecranon process 24.8 24.8  10 cm proximal to ulnar styloid process 22.3 21.3  Just proximal to ulnar styloid process 15.7 15.5  Across hand at thumb web space 18.2 18.3  At base of 2nd digit 6 6  (Blank rows = not tested)    COGNITION:  Overall cognitive status: Within functional limits for tasks assessed   PALPATION: Increased scar tissue under axillary incision, seroma anterior breast, cording from axilla to mid forearm - 2 in antecubital fossa  OBSERVATIONS / OTHER ASSESSMENTS: cording noted in the Rt axilla into forearm , well healed incisions, brusing at breast  POSTURE: WNL  QUICK DASH SURVEY: 34% from 0%   TODAY'S TREATMENT  Date 10/12/21:  Therapeutic Exercises  Pulleys into flexion and abduction returning therapist demo and tactile cues to decrease Rt scapular compensation   Roll yellow up wall into flexion x10 returning therapist demo  Manual Therapy MFR: In Supine to Rt axilla and upper arm in area  of  cording, multiple pops felt by pt and therapist throughout session P/ROM: In Supine to Rt shoulder into flexion, abduction and D2 to pts available end motion with scapular depression throughout  Date 10/08/21:  HEP established-see below  Cording release and PROM Rt UE using cross hands gentle pull, and cocoa butter massage along length Updated HEP with performance - declined new pictures Gave bra prescription - pt is wearing binder   PATIENT EDUCATION:  Education details: POC, cording, HEP Person educated: Patient Education method: Explanation, Demonstration, Tactile cues, Verbal cues, and Handouts Education comprehension: verbalized understanding, returned demonstration, verbal cues required, and needs further education   HOME EXERCISE PROGRAM: Post -op performed supine for flexion and chest stretch and standing median nerve flossing  ASSESSMENT:  CLINICAL IMPRESSION: Began AA/ROM with instruction on how to decrease Rt scapular depression throughout motions. Then continued manual therapy working to decrease Rt UE cording and improve her Rt shoulder ROM. Multiple pops felt during session by pt and therapist at varying areas of the cording in her UE.    OBJECTIVE IMPAIRMENTS decreased activity tolerance, decreased knowledge of use of DME, decreased ROM, decreased strength, and increased edema.   ACTIVITY LIMITATIONS carrying and lifting  PARTICIPATION LIMITATIONS: cleaning, laundry, occupation, and yard work  PERSONAL FACTORS 1 comorbidity: SLNB  are also affecting patient's functional outcome.   REHAB POTENTIAL: Excellent  CLINICAL DECISION MAKING: Stable/uncomplicated  EVALUATION COMPLEXITY: Low  GOALS: Goals reviewed with patient? Yes   LONG TERM GOALS: Target date: 11/05/2021    Pt will return to baseline AROM Baseline:  Goal status: INITIAL  2.  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  Pt will obtain compression bra for  seroma and radiation Baseline:  Goal status: INITIAL  PLAN: PT FREQUENCY: 2x/week  PT DURATION: 4 weeks  PLANNED INTERVENTIONS: Therapeutic exercises, Patient/Family education, Self Care, DME instructions, Manual therapy, and Re-evaluation  PLAN FOR NEXT SESSION: Cont Rt UE PROM, cording work, pulleys and ball roll up wall   Otelia Limes, PTA 10/12/2021, 10:06 AM

## 2021-10-14 ENCOUNTER — Encounter: Payer: Self-pay | Admitting: Rehabilitation

## 2021-10-14 ENCOUNTER — Encounter: Payer: Self-pay | Admitting: *Deleted

## 2021-10-14 ENCOUNTER — Ambulatory Visit: Payer: No Typology Code available for payment source | Admitting: Rehabilitation

## 2021-10-14 DIAGNOSIS — M25611 Stiffness of right shoulder, not elsewhere classified: Secondary | ICD-10-CM

## 2021-10-14 DIAGNOSIS — M79601 Pain in right arm: Secondary | ICD-10-CM

## 2021-10-14 DIAGNOSIS — C50411 Malignant neoplasm of upper-outer quadrant of right female breast: Secondary | ICD-10-CM | POA: Diagnosis not present

## 2021-10-14 DIAGNOSIS — R293 Abnormal posture: Secondary | ICD-10-CM

## 2021-10-14 DIAGNOSIS — R600 Localized edema: Secondary | ICD-10-CM

## 2021-10-14 NOTE — Therapy (Signed)
OUTPATIENT PHYSICAL THERAPY ONCOLOGY TREATMENT NOTE  Patient Name: [name] [name] MRN: [id number] DOB:Sep 24, 1960, 61 y.o., female Today's Date: 10/14/2021   PT End of Session - 10/14/21 1728     Visit Number 3    Number of Visits 9    Date for PT Re-Evaluation 11/12/21    PT Start Time 1400    PT Stop Time 2774    PT Time Calculation (min) 54 min    Activity Tolerance Patient tolerated treatment well    Behavior During Therapy [facility] for tasks assessed/performed              Past Medical History:  Diagnosis Date   Abnormal bleeding in menstrual cycle    Abnormal Pap smear of vagina    Breast cancer ([place])    right breast   COVID 08/2019   Endometrial polyp    Hot flashes    Irregular heart beats    PONV (postoperative nausea and vomiting)    Typical atrial flutter ([place]) 01/01/2019   Past Surgical History:  Procedure Laterality Date   BREAST BIOPSY Right 04/05/2021   BREAST IMPLANT REMOVAL Bilateral 2021   breast implants Bilateral 2002   BREAST LUMPECTOMY WITH RADIOACTIVE SEED AND SENTINEL LYMPH NODE BIOPSY Right 09/08/2021   Procedure: RIGHT BREAST SEED BRACKETED LUMPECTOMY AND SENTINEL LYMPH NODE BIOPSY;  Surgeon: Stark Klein, MD;  Location: [place];  Service: General;  Laterality: Right;  [place] N/A 04/06/2021   Procedure: INSERTION PORT-A-CATH;  Surgeon: Stark Klein, MD;  Location: [place];  Service: General;  Laterality: N/A;   SHOULDER SURGERY Right    Rotator cuff repair   Patient Active Problem List   Diagnosis Date Noted   Dysuria 06/08/2021   Port-A-Cath in place 05/18/2021   Chemotherapy induced diarrhea 04/27/2021   Rash and nonspecific skin eruption 04/27/2021   UTI (urinary tract infection) 04/27/2021   Bone pain due to granulocyte colony stimulating factor 04/27/2021   Transaminitis 04/27/2021   Genetic testing 03/31/2021   Malignant neoplasm of upper-outer quadrant of right breast in female,  estrogen receptor positive ([place]) 03/16/2021   History of cervical dysplasia 02/17/2021   Hot flashes due to menopause 09/16/2019   Typical atrial flutter ([place]) 01/01/2019    PCP: Zack Seal, MD  REFERRING PROVIDER: Shona Simpson PA-C  REFERRING DIAG: Rt breast cancer     THERAPY DIAG:  Malignant neoplasm of upper-outer quadrant of right breast in female, estrogen receptor positive ([place])  Stiffness of right shoulder, not elsewhere classified  Pain in right arm  Localized edema  Abnormal posture  ONSET DATE: 02/2021  Rationale for Evaluation and Treatment Rehabilitation  SUBJECTIVE  SUBJECTIVE STATEMENT: It is feeling much better.  I got a bruise on the forearm from popping the cords but it is fine.   PERTINENT HISTORY:  Patient was diagnosed on 03/04/2021 with right grade II invasive ductal carcinoma breast cancer. It measures 2.2 cm and is located in the upper outer quadrant. It is triple positive with a Ki67 of 25%.  She has a history of breast implants and had a right rotator cuff repair in 2019. Neoadjuvant chemotherapy. Rt lumpectomy and SLNB on 09/08/21 with 5 negative nodes removed.  Drainage noted from incision 09/28/21 with infection. Will be having radiation.    PAIN:  Are you having pain? Yes - It is just uncomfortable  PRECAUTIONS: Rt lymphedema risk  WEIGHT BEARING RESTRICTIONS No  FALLS:  Has patient fallen in last 6 months? 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   PRIOR LEVEL OF FUNCTION: Independent  PATIENT GOALS get the cording and seroma gone   OBJECTIVE UPPER EXTREMITY AROM/PROM:    A/PROM RIGHT  03/17/2021   10/08/21 10/14/21  Shoulder extension 58    Shoulder flexion 144 130 - cording evident 145  Shoulder abduction 165 115 - cording evident  133  Shoulder internal rotation 82    Shoulder external rotation 78                            (Blank rows = not tested)   A/PROM LEFT  03/17/2021 10/08/21  Shoulder extension 53   Shoulder flexion 144   Shoulder abduction 161   Shoulder internal rotation 77   Shoulder external rotation 90                           (Blank rows = not tested)    CERVICAL AROM: All within normal limits    LYMPHEDEMA ASSESSMENTS:    LANDMARK RIGHT  03/17/2021 10/08/21  10 cm proximal to olecranon process 28.9 29  Olecranon process 24.'7 25  10 ' cm proximal to ulnar styloid process 23.1 23.1  Just proximal to ulnar styloid process 15.6 15.6  Across hand at thumb web space 18.2 18.3  At base of 2nd digit 6 6  (Blank rows = not tested)   [facility] LEFT  03/17/2021 10/08/21  10 cm proximal to olecranon process 29.2 29  Olecranon process 24.8 24.8  10 cm proximal to ulnar styloid process 22.3 21.3  Just proximal to ulnar styloid process 15.7 15.5  Across hand at thumb web space 18.2 18.3  At base of 2nd digit 6 6  (Blank rows = not tested)    COGNITION: Overall cognitive status: Within functional limits for tasks assessed   PALPATION: Increased scar tissue under axillary incision, seroma anterior breast, cording from axilla to mid forearm - 2 in antecubital fossa  OBSERVATIONS / OTHER ASSESSMENTS: cording noted in the Rt axilla into forearm , well healed incisions, brusing at breast  POSTURE: WNL  QUICK DASH SURVEY: 34% from 0%   TODAY'S TREATMENT  Date 10/14/21:  Therapeutic Exercises Pulleys into flexion and abduction x 67mn returning therapist demo and tactile cues to decrease Rt scapular compensation  Roll yellow up wall into flexion and abduction x10 returning therapist demo  Manual Therapy MFR: In Supine to Rt axilla and  upper  arm in area of cording P/ROM: In Supine to Rt shoulder into flexion, abduction and D2 to pts available end motion with scapular depression throughout  Date 10/12/21:  Therapeutic Exercises  Pulleys into flexion and abduction returning therapist demo and tactile cues to decrease Rt scapular compensation   Roll yellow up wall into flexion x10 returning therapist demo  Manual Therapy MFR: In Supine to Rt axilla and upper arm in area of cording, multiple pops felt by pt and therapist throughout session P/ROM: In Supine to Rt shoulder into flexion, abduction and D2 to pts available end motion with scapular depression throughout  Date 10/08/21:  HEP established-see below  Cording release and PROM Rt UE using cross hands gentle pull, and cocoa butter massage along length Updated HEP with performance - declined new pictures Gave bra prescription - pt is wearing binder   PATIENT EDUCATION:  Education details: POC, cording, HEP Person educated: Patient Education method: Consulting civil engineer, Demonstration, Tactile cues, Verbal cues, and Handouts Education comprehension: verbalized understanding, returned demonstration, verbal cues required, and needs further education  HOME EXERCISE PROGRAM: Post -op performed supine for flexion and chest stretch and standing median nerve flossing  ASSESSMENT:  CLINICAL IMPRESSION: Pt has returned flexion ROM to baseline but is still limited into abduction.  Cording is still very evident and visible but not as taut.    OBJECTIVE IMPAIRMENTS decreased activity tolerance, decreased knowledge of use of DME, decreased ROM, decreased strength, and increased edema.   ACTIVITY LIMITATIONS carrying and lifting  PARTICIPATION LIMITATIONS: cleaning, laundry, occupation, and yard work  PERSONAL FACTORS 1 comorbidity: SLNB  are also affecting patient's functional outcome.   REHAB POTENTIAL: Excellent  CLINICAL DECISION MAKING: Stable/uncomplicated  EVALUATION COMPLEXITY:  Low  GOALS: Goals reviewed with patient? Yes   LONG TERM GOALS: Target date: 11/05/2021    Pt will return to baseline AROM Baseline:  Goal status: INITIAL  2.  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  Pt will obtain compression bra for seroma and radiation Baseline:  Goal status: INITIAL  PLAN: PT FREQUENCY: 2x/week  PT DURATION: 4 weeks  PLANNED INTERVENTIONS: Therapeutic exercises, Patient/Family education, Self Care, DME instructions, Manual therapy, and Re-evaluation  PLAN FOR NEXT SESSION: Cont Rt UE PROM, cording work, pulleys and ball roll up wall   Stark Bray, PT 10/14/2021, 5:29 PM

## 2021-10-15 ENCOUNTER — Other Ambulatory Visit: Payer: Self-pay

## 2021-10-15 ENCOUNTER — Ambulatory Visit
Admission: RE | Admit: 2021-10-15 | Discharge: 2021-10-15 | Disposition: A | Discharge status: Home / Self Care | Payer: No Typology Code available for payment source | Source: Ambulatory Visit | Attending: Radiation Oncology | Admitting: Radiation Oncology

## 2021-10-15 ENCOUNTER — Inpatient Hospital Stay: Payer: No Typology Code available for payment source

## 2021-10-15 DIAGNOSIS — C50411 Malignant neoplasm of upper-outer quadrant of right female breast: Secondary | ICD-10-CM | POA: Insufficient documentation

## 2021-10-15 DIAGNOSIS — Z170 Estrogen receptor positive status [ER+]: Secondary | ICD-10-CM | POA: Insufficient documentation

## 2021-10-18 ENCOUNTER — Ambulatory Visit: Payer: No Typology Code available for payment source | Admitting: Rehabilitation

## 2021-10-18 ENCOUNTER — Encounter: Payer: Self-pay | Admitting: Rehabilitation

## 2021-10-18 ENCOUNTER — Other Ambulatory Visit: Payer: Self-pay | Admitting: *Deleted

## 2021-10-18 DIAGNOSIS — R293 Abnormal posture: Secondary | ICD-10-CM

## 2021-10-18 DIAGNOSIS — C50411 Malignant neoplasm of upper-outer quadrant of right female breast: Secondary | ICD-10-CM

## 2021-10-18 DIAGNOSIS — M79601 Pain in right arm: Secondary | ICD-10-CM

## 2021-10-18 DIAGNOSIS — R600 Localized edema: Secondary | ICD-10-CM

## 2021-10-18 DIAGNOSIS — M25611 Stiffness of right shoulder, not elsewhere classified: Secondary | ICD-10-CM

## 2021-10-18 NOTE — Therapy (Signed)
OUTPATIENT PHYSICAL THERAPY ONCOLOGY TREATMENT NOTE  Patient Name: [name] [name] MRN: [id number] DOB:21-Sep-1960, 61 y.o., female Today's Date: 10/18/2021   PT End of Session - 10/18/21 1255     Visit Number 4    Number of Visits 9    Date for PT Re-Evaluation 11/12/21    PT Start Time 1202    PT Stop Time 3013    PT Time Calculation (min) 53 min    Activity Tolerance Patient tolerated treatment well    Behavior During Therapy [facility] for tasks assessed/performed               Past Medical History:  Diagnosis Date   Abnormal bleeding in menstrual cycle    Abnormal Pap smear of vagina    Breast cancer ([place])    right breast   COVID 08/2019   Endometrial polyp    Hot flashes    Irregular heart beats    PONV (postoperative nausea and vomiting)    Typical atrial flutter ([place]) 01/01/2019   Past Surgical History:  Procedure Laterality Date   BREAST BIOPSY Right 04/05/2021   BREAST IMPLANT REMOVAL Bilateral 2021   breast implants Bilateral 2002   BREAST LUMPECTOMY WITH RADIOACTIVE SEED AND SENTINEL LYMPH NODE BIOPSY Right 09/08/2021   Procedure: RIGHT BREAST SEED BRACKETED LUMPECTOMY AND SENTINEL LYMPH NODE BIOPSY;  Surgeon: Stark Klein, MD;  Location: [place];  Service: General;  Laterality: Right;  [place] N/A 04/06/2021   Procedure: INSERTION PORT-A-CATH;  Surgeon: Stark Klein, MD;  Location: [place];  Service: General;  Laterality: N/A;   SHOULDER SURGERY Right    Rotator cuff repair   Patient Active Problem List   Diagnosis Date Noted   Dysuria 06/08/2021   Port-A-Cath in place 05/18/2021   Chemotherapy induced diarrhea 04/27/2021   Rash and nonspecific skin eruption 04/27/2021   UTI (urinary tract infection) 04/27/2021   Bone pain due to granulocyte colony stimulating factor 04/27/2021   Transaminitis 04/27/2021   Genetic testing 03/31/2021   Malignant neoplasm of upper-outer quadrant of right breast in female,  estrogen receptor positive ([place]) 03/16/2021   History of cervical dysplasia 02/17/2021   Hot flashes due to menopause 09/16/2019   Typical atrial flutter ([place]) 01/01/2019    PCP: Zack Seal, MD  REFERRING PROVIDER: Shona Simpson PA-C  REFERRING DIAG: Rt breast cancer     THERAPY DIAG:  Malignant neoplasm of upper-outer quadrant of right breast in female, estrogen receptor positive ([place])  Stiffness of right shoulder, not elsewhere classified  Pain in right arm  Localized edema  Abnormal posture  ONSET DATE: 02/2021  Rationale for Evaluation and Treatment Rehabilitation  SUBJECTIVE  SUBJECTIVE STATEMENT: The simulation was fine.  I start in October.    PERTINENT HISTORY:  Patient was diagnosed on 03/04/2021 with right grade II invasive ductal carcinoma breast cancer. It measures 2.2 cm and is located in the upper outer quadrant. It is triple positive with a Ki67 of 25%.  She has a history of breast implants and had a right rotator cuff repair in 2019. Neoadjuvant chemotherapy. Rt lumpectomy and SLNB on 09/08/21 with 5 negative nodes removed.  Drainage noted from incision 09/28/21 with infection. Will be having radiation.    PAIN:  Are you having pain? Yes - It is just uncomfortable  PRECAUTIONS: Rt lymphedema risk  WEIGHT BEARING RESTRICTIONS No  FALLS:  Has patient fallen in last 6 months? 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   PRIOR LEVEL OF FUNCTION: Independent  PATIENT GOALS get the cording and seroma gone   OBJECTIVE UPPER EXTREMITY AROM/PROM:   A/PROM RIGHT  03/17/2021   10/08/21 10/14/21  10/18/21  Shoulder extension 58     Shoulder flexion 144 130 - cording evident 145 165  Shoulder abduction 165 115 - cording evident  133 165  Shoulder internal rotation 82     Shoulder external rotation 78                             (Blank rows = not tested)   A/PROM LEFT  03/17/2021 10/08/21  Shoulder extension 53   Shoulder flexion 144   Shoulder abduction 161   Shoulder internal rotation 77   Shoulder external rotation 90                           (Blank rows = not tested)    CERVICAL AROM: All within normal limits    LYMPHEDEMA ASSESSMENTS:    LANDMARK RIGHT  03/17/2021 10/08/21  10 cm proximal to olecranon process 28.9 29  Olecranon process 24.'7 25  10 ' cm proximal to ulnar styloid process 23.1 23.1  Just proximal to ulnar styloid process 15.6 15.6  Across hand at thumb web space 18.2 18.3  At base of 2nd digit 6 6  (Blank rows = not tested)   [facility] LEFT  03/17/2021 10/08/21  10 cm proximal to olecranon process 29.2 29  Olecranon process 24.8 24.8  10 cm proximal to ulnar styloid process 22.3 21.3  Just proximal to ulnar styloid process 15.7 15.5  Across hand at thumb web space 18.2 18.3  At base of 2nd digit 6 6  (Blank rows = not tested)    COGNITION: Overall cognitive status: Within functional limits for tasks assessed   PALPATION: Increased scar tissue under axillary incision, seroma anterior breast, cording from axilla to mid forearm - 2 in antecubital fossa  OBSERVATIONS / OTHER ASSESSMENTS: cording noted in the Rt axilla into forearm , well healed incisions, brusing at breast  POSTURE: WNL  QUICK DASH SURVEY: 34% from 0%   TODAY'S TREATMENT  Date 10/18/21:  Therapeutic Exercises Pulleys into flexion and abduction x 23mn returning therapist demo and tactile cues to decrease Rt scapular compensation  Roll yellow up wall into flexion and abduction x10 Standing horizontal abduction yellow x 5, D2 flexion x 5, bil ER x 5   Manual Therapy  MFR: In Supine to  Rt axilla and upper arm in area of cording P/ROM: In Supine to Rt shoulder into flexion, abduction and D2 to pts available end motion   Date 10/14/21:  Therapeutic Exercises Pulleys into flexion and abduction x 5mn returning therapist demo and tactile cues to decrease Rt scapular compensation  Roll yellow up wall into flexion and abduction x10 returning therapist demo  Manual Therapy MFR: In Supine to Rt axilla and upper arm in area of cording P/ROM: In Supine to Rt shoulder into flexion, abduction and D2 to pts available end motion with scapular depression throughout  Date 10/12/21:  Therapeutic Exercises  Pulleys into flexion and abduction returning therapist demo and tactile cues to decrease Rt scapular compensation   Roll yellow up wall into flexion x10 returning therapist demo  Manual Therapy MFR: In Supine to Rt axilla and upper arm in area of cording, multiple pops felt by pt and therapist throughout session P/ROM: In Supine to Rt shoulder into flexion, abduction and D2 to pts available end motion with scapular depression throughout   PATIENT EDUCATION:  Education details: POC, cording, HEP Person educated: Patient Education method: EConsulting civil engineer Demonstration, Tactile cues, Verbal cues, and Handouts Education comprehension: verbalized understanding, returned demonstration, verbal cues required, and needs further education  HOME EXERCISE PROGRAM: Post -op performed supine for flexion and chest stretch and standing median nerve flossing  ASSESSMENT:  CLINICAL IMPRESSION: Pt has returned to baseline AROM. Cording is still very evident and visible but not as taut.  Pt was able to start strengthening today which she was excited about so she can feel stronger for work.     OBJECTIVE IMPAIRMENTS decreased activity tolerance, decreased knowledge of use of DME, decreased ROM, decreased strength, and increased edema.   ACTIVITY LIMITATIONS carrying and lifting  PARTICIPATION  LIMITATIONS: cleaning, laundry, occupation, and yard work  PERSONAL FACTORS 1 comorbidity: SLNB  are also affecting patient's functional outcome.   REHAB POTENTIAL: Excellent  CLINICAL DECISION MAKING: Stable/uncomplicated  EVALUATION COMPLEXITY: Low  GOALS: Goals reviewed with patient? Yes   LONG TERM GOALS: Target date: 11/05/2021    Pt will return to baseline AROM Baseline:  Goal status: MET  2.  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  Pt will obtain compression bra for seroma and radiation Baseline:  Goal status: MET  PLAN: PT FREQUENCY: 2x/week  PT DURATION: 4 weeks  PLANNED INTERVENTIONS: Therapeutic exercises, Patient/Family education, Self Care, DME instructions, Manual therapy, and Re-evaluation  PLAN FOR NEXT SESSION: Cont Rt UE PROM, cording work, pulleys and ball roll up wall, add full standing scap/band exercises with slow progression   Odelia Graciano, KAdrian Prince PT 10/18/2021, 1:01 PM

## 2021-10-19 ENCOUNTER — Inpatient Hospital Stay: Payer: No Typology Code available for payment source

## 2021-10-19 ENCOUNTER — Other Ambulatory Visit: Payer: Self-pay

## 2021-10-19 VITALS — BP 151/78 | HR 68 | Temp 98.3°F | Resp 18 | Wt 148.0 lb

## 2021-10-19 DIAGNOSIS — Z5111 Encounter for antineoplastic chemotherapy: Secondary | ICD-10-CM | POA: Diagnosis not present

## 2021-10-19 DIAGNOSIS — Z95828 Presence of other vascular implants and grafts: Secondary | ICD-10-CM

## 2021-10-19 DIAGNOSIS — C50411 Malignant neoplasm of upper-outer quadrant of right female breast: Secondary | ICD-10-CM

## 2021-10-19 LAB — CBC WITH DIFFERENTIAL (CANCER CENTER ONLY)
Abs Immature Granulocytes: 0.01 10*3/uL (ref 0.00–0.07)
Basophils Absolute: 0 10*3/uL (ref 0.0–0.1)
Basophils Relative: 1 %
Eosinophils Absolute: 0.2 10*3/uL (ref 0.0–0.5)
Eosinophils Relative: 5 %
HCT: 34.5 % — ABNORMAL LOW (ref 36.0–46.0)
Hemoglobin: 11.7 g/dL — ABNORMAL LOW (ref 12.0–15.0)
Immature Granulocytes: 0 %
Lymphocytes Relative: 38 %
Lymphs Abs: 1.2 10*3/uL (ref 0.7–4.0)
MCH: 31.6 pg (ref 26.0–34.0)
MCHC: 33.9 g/dL (ref 30.0–36.0)
MCV: 93.2 fL (ref 80.0–100.0)
Monocytes Absolute: 0.4 10*3/uL (ref 0.1–1.0)
Monocytes Relative: 12 %
Neutro Abs: 1.4 10*3/uL — ABNORMAL LOW (ref 1.7–7.7)
Neutrophils Relative %: 44 %
Platelet Count: 204 10*3/uL (ref 150–400)
RBC: 3.7 MIL/uL — ABNORMAL LOW (ref 3.87–5.11)
RDW: 12.7 % (ref 11.5–15.5)
WBC Count: 3.1 10*3/uL — ABNORMAL LOW (ref 4.0–10.5)
nRBC: 0 % (ref 0.0–0.2)

## 2021-10-19 LAB — CMP (CANCER CENTER ONLY)
ALT: 15 U/L (ref 0–44)
AST: 17 U/L (ref 15–41)
Albumin: 4 g/dL (ref 3.5–5.0)
Alkaline Phosphatase: 56 U/L (ref 38–126)
Anion gap: 5 (ref 5–15)
BUN: 18 mg/dL (ref 8–23)
CO2: 28 mmol/L (ref 22–32)
Calcium: 9.1 mg/dL (ref 8.9–10.3)
Chloride: 107 mmol/L (ref 98–111)
Creatinine: 0.82 mg/dL (ref 0.44–1.00)
GFR, Estimated: >60 mL/min (ref >60)
Glucose, Bld: 133 mg/dL — ABNORMAL HIGH (ref 70–99)
Potassium: 3.9 mmol/L (ref 3.5–5.1)
Sodium: 140 mmol/L (ref 135–145)
Total Bilirubin: 0.4 mg/dL (ref 0.3–1.2)
Total Protein: 6.3 g/dL — ABNORMAL LOW (ref 6.5–8.1)

## 2021-10-19 MED ORDER — ACETAMINOPHEN 325 MG PO TABS
650.0000 mg | ORAL_TABLET | Freq: Once | ORAL | Status: AC
  Administered 2021-10-19: 650 mg via ORAL
  Filled 2021-10-19: qty 2

## 2021-10-19 MED ORDER — DIPHENHYDRAMINE HCL 25 MG PO CAPS
50.0000 mg | ORAL_CAPSULE | Freq: Once | ORAL | Status: AC
  Administered 2021-10-19: 50 mg via ORAL
  Filled 2021-10-19: qty 2

## 2021-10-19 MED ORDER — SODIUM CHLORIDE 0.9 % IV SOLN: Freq: Once | INTRAVENOUS | Status: AC

## 2021-10-19 MED ORDER — SODIUM CHLORIDE 0.9% FLUSH
10.0000 mL | INTRAVENOUS | Status: DC | PRN
  Administered 2021-10-19: 10 mL

## 2021-10-19 MED ORDER — SODIUM CHLORIDE 0.9 % IV SOLN
420.0000 mg | Freq: Once | INTRAVENOUS | Status: AC
  Administered 2021-10-19: 420 mg via INTRAVENOUS
  Filled 2021-10-19: qty 14

## 2021-10-19 MED ORDER — SODIUM CHLORIDE 0.9% FLUSH
10.0000 mL | Freq: Once | INTRAVENOUS | Status: AC
  Administered 2021-10-19: 10 mL

## 2021-10-19 MED ORDER — TRASTUZUMAB-ANNS CHEMO 150 MG IV SOLR
6.0000 mg/kg | Freq: Once | INTRAVENOUS | Status: AC
  Administered 2021-10-19: 399 mg via INTRAVENOUS
  Filled 2021-10-19: qty 19

## 2021-10-19 MED ORDER — HEPARIN SOD (PORK) LOCK FLUSH 100 UNIT/ML IV SOLN
500.0000 [IU] | Freq: Once | INTRAVENOUS | Status: AC | PRN
  Administered 2021-10-19: 500 [IU]

## 2021-10-19 NOTE — Patient Instructions (Signed)
[facility] MEDICAL ONCOLOGY  Discharge Instructions: Thank you for choosing [facility] to provide your oncology and hematology care.   If you have a lab appointment with the Cancer Center, please go directly to the Cancer Center and check in at the registration area.   Wear comfortable clothing and clothing appropriate for easy access to any Portacath or PICC line.   We strive to give you quality time with your provider. You may need to reschedule your appointment if you arrive late (15 or more minutes).  Arriving late affects you and other patients whose appointments are after yours.  Also, if you miss three or more appointments without notifying the office, you may be dismissed from the clinic at the provider's discretion.      For prescription refill requests, have your pharmacy contact our office and allow 72 hours for refills to be completed.    Today you received the following chemotherapy and/or immunotherapy agents: trastuzumab, pertuzumab      To help prevent nausea and vomiting after your treatment, we encourage you to take your nausea medication as directed.  BELOW ARE SYMPTOMS THAT SHOULD BE REPORTED IMMEDIATELY: *FEVER GREATER THAN 100.4 F (38 C) OR HIGHER *CHILLS OR SWEATING *NAUSEA AND VOMITING THAT IS NOT CONTROLLED WITH YOUR NAUSEA MEDICATION *UNUSUAL SHORTNESS OF BREATH *UNUSUAL BRUISING OR BLEEDING *URINARY PROBLEMS (pain or burning when urinating, or frequent urination) *BOWEL PROBLEMS (unusual diarrhea, constipation, pain near the anus) TENDERNESS IN MOUTH AND THROAT WITH OR WITHOUT PRESENCE OF ULCERS (sore throat, sores in mouth, or a toothache) UNUSUAL RASH, SWELLING OR PAIN  UNUSUAL VAGINAL DISCHARGE OR ITCHING   Items with * indicate a potential emergency and should be followed up as soon as possible or go to the Emergency Department if any problems should occur.  Please show the CHEMOTHERAPY ALERT CARD or IMMUNOTHERAPY ALERT CARD  at check-in to the Emergency Department and triage nurse.  Should you have questions after your visit or need to cancel or reschedule your appointment, please contact [facility] MEDICAL ONCOLOGY  Dept: [phone]  and follow the prompts.  Office hours are 8:00 a.m. to 4:30 p.m. Monday - Friday. Please note that voicemails left after 4:00 p.m. may not be returned until the following business day.  We are closed weekends and major holidays. You have access to a nurse at all times for urgent questions. Please call the main number to the clinic Dept: [phone] and follow the prompts.   For any non-urgent questions, you may also contact your provider using MyChart. We now offer e-Visits for anyone 18 and older to request care online for non-urgent symptoms. For details visit [email or web address].   Also download the MyChart app! Go to the app store, search "MyChart", open the app, select [place], and log in with your MyChart username and password.  Masks are optional in the cancer centers. If you would like for your care team to wear a mask while they are taking care of you, please let them know. You may have one support person who is at least 63 years old accompany you for your appointments. 

## 2021-10-20 DIAGNOSIS — C50411 Malignant neoplasm of upper-outer quadrant of right female breast: Secondary | ICD-10-CM | POA: Diagnosis not present

## 2021-10-21 ENCOUNTER — Encounter: Payer: Self-pay | Admitting: Rehabilitation

## 2021-10-21 ENCOUNTER — Ambulatory Visit: Payer: No Typology Code available for payment source | Admitting: Rehabilitation

## 2021-10-21 DIAGNOSIS — M79601 Pain in right arm: Secondary | ICD-10-CM

## 2021-10-21 DIAGNOSIS — M25611 Stiffness of right shoulder, not elsewhere classified: Secondary | ICD-10-CM

## 2021-10-21 DIAGNOSIS — C50411 Malignant neoplasm of upper-outer quadrant of right female breast: Secondary | ICD-10-CM | POA: Diagnosis not present

## 2021-10-21 DIAGNOSIS — R600 Localized edema: Secondary | ICD-10-CM

## 2021-10-21 DIAGNOSIS — Z170 Estrogen receptor positive status [ER+]: Secondary | ICD-10-CM

## 2021-10-21 DIAGNOSIS — R293 Abnormal posture: Secondary | ICD-10-CM

## 2021-10-21 NOTE — Therapy (Signed)
OUTPATIENT PHYSICAL THERAPY ONCOLOGY TREATMENT NOTE  Patient Name: [name] [name] MRN: [id number] DOB:[birth year], 61 y.o., female Today's Date: 10/21/2021   PT End of Session - 10/21/21 1541     Visit Number 5    Number of Visits 9    Date for PT Re-Evaluation 11/12/21    PT Start Time 1100    PT Stop Time 1153    PT Time Calculation (min) 53 min    Activity Tolerance Patient tolerated treatment well    Behavior During Therapy [facility] for tasks assessed/performed                Past Medical History:  Diagnosis Date   Abnormal bleeding in menstrual cycle    Abnormal Pap smear of vagina    Breast cancer ([place])    right breast   COVID 08/2019   Endometrial polyp    Hot flashes    Irregular heart beats    PONV (postoperative nausea and vomiting)    Typical atrial flutter ([place]) 01/01/2019   Past Surgical History:  Procedure Laterality Date   BREAST BIOPSY Right 04/05/2021   BREAST IMPLANT REMOVAL Bilateral 2021   breast implants Bilateral 2002   BREAST LUMPECTOMY WITH RADIOACTIVE SEED AND SENTINEL LYMPH NODE BIOPSY Right 09/08/2021   Procedure: RIGHT BREAST SEED BRACKETED LUMPECTOMY AND SENTINEL LYMPH NODE BIOPSY;  Surgeon: Stark Klein, MD;  Location: [place];  Service: General;  Laterality: Right;  [place] N/A 04/06/2021   Procedure: INSERTION PORT-A-CATH;  Surgeon: Stark Klein, MD;  Location: [place];  Service: General;  Laterality: N/A;   SHOULDER SURGERY Right    Rotator cuff repair   Patient Active Problem List   Diagnosis Date Noted   Dysuria 06/08/2021   Port-A-Cath in place 05/18/2021   Chemotherapy induced diarrhea 04/27/2021   Rash and nonspecific skin eruption 04/27/2021   UTI (urinary tract infection) 04/27/2021   Bone pain due to granulocyte colony stimulating factor 04/27/2021   Transaminitis 04/27/2021   Genetic testing 03/31/2021   Malignant neoplasm of upper-outer quadrant of right breast in  female, estrogen receptor positive ([place]) 03/16/2021   History of cervical dysplasia 02/17/2021   Hot flashes due to menopause 09/16/2019   Typical atrial flutter ([place]) 01/01/2019    PCP: Zack Seal, MD  REFERRING PROVIDER: Shona Simpson PA-C  REFERRING DIAG: Rt breast cancer     THERAPY DIAG:  Malignant neoplasm of upper-outer quadrant of right breast in female, estrogen receptor positive ([place])  Abnormal posture  Stiffness of right shoulder, not elsewhere classified  Pain in right arm  Localized edema  ONSET DATE: 02/2021  Rationale for Evaluation and Treatment Rehabilitation  SUBJECTIVE  SUBJECTIVE STATEMENT: The patient reports she is feeling much better and is able to do more yard work.  Pt wants to build back strength in his arms   PERTINENT HISTORY:  Patient was diagnosed on 03/04/2021 with right grade II invasive ductal carcinoma breast cancer. It measures 2.2 cm and is located in the upper outer quadrant. It is triple positive with a Ki67 of 25%.  She has a history of breast implants and had a right rotator cuff repair in 2019. Neoadjuvant chemotherapy. Rt lumpectomy and SLNB on 09/08/21 with 5 negative nodes removed.  Drainage noted from incision 09/28/21 with infection. Will be having radiation.    PAIN:  Are you having pain? Yes - near the incision site  PRECAUTIONS: Rt lymphedema risk  WEIGHT BEARING RESTRICTIONS No  FALLS:  Has patient fallen in last 6 months? 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   PRIOR LEVEL OF FUNCTION: Independent  PATIENT GOALS get the cording and seroma  gone   OBJECTIVE UPPER EXTREMITY AROM/PROM:   A/PROM RIGHT  03/17/2021   10/08/21 10/14/21 10/18/21  Shoulder extension 58     Shoulder flexion 144 130 - cording evident 145 165  Shoulder abduction 165 115 - cording evident  133 165  Shoulder internal rotation 82     Shoulder external rotation 78                             (Blank rows = not tested)   A/PROM LEFT  03/17/2021 10/08/21  Shoulder extension 53   Shoulder flexion 144   Shoulder abduction 161   Shoulder internal rotation 77   Shoulder external rotation 90                           (Blank rows = not tested)    CERVICAL AROM: All within normal limits    LYMPHEDEMA ASSESSMENTS:    LANDMARK RIGHT  03/17/2021 10/08/21  10 cm proximal to olecranon process 28.9 29  Olecranon process 24.'7 25  10 ' cm proximal to ulnar styloid process 23.1 23.1  Just proximal to ulnar styloid process 15.6 15.6  Across hand at thumb web space 18.2 18.3  At base of 2nd digit 6 6  (Blank rows = not tested)   [facility] LEFT  03/17/2021 10/08/21  10 cm proximal to olecranon process 29.2 29  Olecranon process 24.8 24.8  10 cm proximal to ulnar styloid process 22.3 21.3  Just proximal to ulnar styloid process 15.7 15.5  Across hand at thumb web space 18.2 18.3  At base of 2nd digit 6 6  (Blank rows = not tested)    COGNITION: Overall cognitive status: Within functional limits for tasks assessed   PALPATION: Increased scar tissue under axillary incision, seroma anterior breast, cording from axilla to mid forearm - 2 in antecubital fossa  OBSERVATIONS / OTHER ASSESSMENTS: cording noted in the Rt axilla into forearm , well healed incisions, brusing at breast  POSTURE: WNL  QUICK DASH SURVEY: 34% from 0%   TODAY'S TREATMENT   Date 10/21/21:  Therapeutic Exercises Pulleys into flexion and abduction x 45mn returning therapist demo and tactile cues to decrease Rt scapular compensation  Roll yellow up wall into flexion and abduction x12 Standing  horizontal abduction yellow x 10, D2 flexion x 10, bil ER x 10 Rows x10 Bicep curls  x10 Shoulder flexion and scaption 2# x 10  Shoulder horizontal abduction 1# x 10   Manual Therapy MFR: In Supine to Rt axilla and upper arm in area of cording P/ROM: In Supine to Rt shoulder into flexion, abduction and D2 to pts available end motion.  Date 10/18/21:  Therapeutic Exercises Pulleys into flexion and abduction x 71mn returning therapist demo and tactile cues to decrease Rt scapular compensation  Roll yellow up wall into flexion and abduction x10 Standing horizontal abduction yellow x 5, D2 flexion x 5, bil ER x 5   Manual Therapy MFR: In Supine to Rt axilla and upper arm in area of cording P/ROM: In Supine to Rt shoulder into flexion, abduction and D2 to pts available end motion   Date 10/14/21:  Therapeutic Exercises Pulleys into flexion and abduction x 323m returning therapist demo and tactile cues to decrease Rt scapular compensation  Roll yellow up wall into flexion and abduction x10 returning therapist demo  Manual Therapy MFR: In Supine to Rt axilla and upper arm in area of cording P/ROM: In Supine to Rt shoulder into flexion, abduction and D2 to pts available end motion with scapular depression throughout  Date 10/12/21:  Therapeutic Exercises  Pulleys into flexion and abduction returning therapist demo and tactile cues to decrease Rt scapular compensation   Roll yellow up wall into flexion x10 returning therapist demo  Manual Therapy MFR: In Supine to Rt axilla and upper arm in area of cording, multiple pops felt by pt and therapist throughout session P/ROM: In Supine to Rt shoulder into flexion, abduction and D2 to pts available end motion with scapular depression throughout   PATIENT EDUCATION:  Education details: POC, cording, HEP Person educated: Patient Education method: ExConsulting civil engineerDemonstration, Tactile cues, Verbal cues, and Handouts Education comprehension:  verbalized understanding, returned demonstration, verbal cues required, and needs further education  HOME EXERCISE PROGRAM: Post -op performed supine for flexion and chest stretch and standing median nerve flossing  ASSESSMENT:  CLINICAL IMPRESSION: Pt is showing great improvement with ROM. Pt demonstrated general weakness and required minimal verbal cues on posture during exercises.  Patient [facility] well to the [facility] the pulleys and ball roll outs. Cording is still there but not causing any pain or ROM limitations just reports feeling of tightness. Pt would benefit from skilled therapy to continue to address strength deficits.  OBJECTIVE IMPAIRMENTS decreased activity tolerance, decreased knowledge of use of DME, decreased ROM, decreased strength, and increased edema.   ACTIVITY LIMITATIONS carrying and lifting  PARTICIPATION LIMITATIONS: cleaning, laundry, occupation, and yard work  PERSONAL FACTORS 1 comorbidity: SLNB  are also affecting patient's functional outcome.   REHAB POTENTIAL: Excellent  CLINICAL DECISION MAKING: Stable/uncomplicated  EVALUATION COMPLEXITY: Low  GOALS: Goals reviewed with patient? Yes   LONG TERM GOALS: Target date: 11/05/2021    Pt will return to baseline AROM Baseline:  Goal status: MET  2.  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  Pt will obtain compression bra for seroma and radiation Baseline:  Goal status: MET  PLAN: PT FREQUENCY: 2x/week  PT DURATION: 4 weeks  PLANNED INTERVENTIONS: Therapeutic exercises, Patient/Family education, Self Care, DME instructions, Manual therapy, and Re-evaluation  PLAN FOR NEXT SESSION: Cont Rt UE PROM, cording work, pulleys and ball roll up wall, add full standing scap/band exercises with slow progression,  add minimal weight lifting.   Rosario Jacks, Student-PT 10/21/2021, 3:41 PM

## 2021-10-25 ENCOUNTER — Encounter: Payer: Self-pay | Admitting: *Deleted

## 2021-10-25 ENCOUNTER — Ambulatory Visit
Admission: RE | Admit: 2021-10-25 | Discharge: 2021-10-25 | Disposition: A | Discharge status: Home / Self Care | Payer: No Typology Code available for payment source | Source: Ambulatory Visit | Attending: Radiation Oncology | Admitting: Radiation Oncology

## 2021-10-25 ENCOUNTER — Other Ambulatory Visit: Payer: Self-pay

## 2021-10-25 DIAGNOSIS — Z170 Estrogen receptor positive status [ER+]: Secondary | ICD-10-CM | POA: Insufficient documentation

## 2021-10-25 DIAGNOSIS — C50411 Malignant neoplasm of upper-outer quadrant of right female breast: Secondary | ICD-10-CM | POA: Insufficient documentation

## 2021-10-25 DIAGNOSIS — Z5112 Encounter for antineoplastic immunotherapy: Secondary | ICD-10-CM | POA: Diagnosis present

## 2021-10-25 DIAGNOSIS — Z5111 Encounter for antineoplastic chemotherapy: Secondary | ICD-10-CM | POA: Diagnosis present

## 2021-10-25 LAB — RAD ONC ARIA SESSION SUMMARY
Course Elapsed Days: 0
Plan Fractions Treated to Date: 1
Plan Prescribed Dose Per Fraction: 2.66 Gy
Plan Total Fractions Prescribed: 16
Plan Total Prescribed Dose: 42.56 Gy
Reference Point Dosage Given to Date: 2.66 Gy
Reference Point Session Dosage Given: 2.66 Gy
Session Number: 1

## 2021-10-26 ENCOUNTER — Encounter: Payer: Self-pay | Admitting: Rehabilitation

## 2021-10-26 ENCOUNTER — Other Ambulatory Visit: Payer: Self-pay

## 2021-10-26 ENCOUNTER — Other Ambulatory Visit: Payer: Self-pay | Admitting: Hematology and Oncology

## 2021-10-26 ENCOUNTER — Ambulatory Visit
Admission: RE | Admit: 2021-10-26 | Discharge: 2021-10-26 | Disposition: A | Discharge status: Home / Self Care | Payer: No Typology Code available for payment source | Source: Ambulatory Visit | Attending: Radiation Oncology | Admitting: Radiation Oncology

## 2021-10-26 ENCOUNTER — Ambulatory Visit: Payer: No Typology Code available for payment source | Attending: Radiation Oncology | Admitting: Rehabilitation

## 2021-10-26 DIAGNOSIS — Z5111 Encounter for antineoplastic chemotherapy: Secondary | ICD-10-CM | POA: Diagnosis not present

## 2021-10-26 DIAGNOSIS — R600 Localized edema: Secondary | ICD-10-CM | POA: Insufficient documentation

## 2021-10-26 DIAGNOSIS — M25611 Stiffness of right shoulder, not elsewhere classified: Secondary | ICD-10-CM | POA: Insufficient documentation

## 2021-10-26 DIAGNOSIS — R293 Abnormal posture: Secondary | ICD-10-CM | POA: Diagnosis present

## 2021-10-26 DIAGNOSIS — C50411 Malignant neoplasm of upper-outer quadrant of right female breast: Secondary | ICD-10-CM | POA: Insufficient documentation

## 2021-10-26 DIAGNOSIS — M79601 Pain in right arm: Secondary | ICD-10-CM | POA: Diagnosis present

## 2021-10-26 DIAGNOSIS — Z170 Estrogen receptor positive status [ER+]: Secondary | ICD-10-CM | POA: Diagnosis present

## 2021-10-26 LAB — RAD ONC ARIA SESSION SUMMARY
Course Elapsed Days: 1
Plan Fractions Treated to Date: 2
Plan Prescribed Dose Per Fraction: 2.66 Gy
Plan Total Fractions Prescribed: 16
Plan Total Prescribed Dose: 42.56 Gy
Reference Point Dosage Given to Date: 5.32 Gy
Reference Point Session Dosage Given: 2.66 Gy
Session Number: 2

## 2021-10-26 NOTE — Therapy (Signed)
OUTPATIENT PHYSICAL THERAPY ONCOLOGY TREATMENT NOTE  Patient Name: [name] [name] MRN: [id number] DOB:1960-12-20, 61 y.o., female Today's Date: 10/26/2021   PT End of Session - 10/26/21 1147     Visit Number 6    Number of Visits 9    Date for PT Re-Evaluation 11/12/21    PT Start Time 1008    PT Stop Time 1055    PT Time Calculation (min) 47 min    Activity Tolerance Patient tolerated treatment well    Behavior During Therapy [facility] for tasks assessed/performed                 Past Medical History:  Diagnosis Date   Abnormal bleeding in menstrual cycle    Abnormal Pap smear of vagina    Breast cancer ([place])    right breast   COVID 08/2019   Endometrial polyp    Hot flashes    Irregular heart beats    PONV (postoperative nausea and vomiting)    Typical atrial flutter ([place]) 01/01/2019   Past Surgical History:  Procedure Laterality Date   BREAST BIOPSY Right 04/05/2021   BREAST IMPLANT REMOVAL Bilateral 2021   breast implants Bilateral 2002   BREAST LUMPECTOMY WITH RADIOACTIVE SEED AND SENTINEL LYMPH NODE BIOPSY Right 09/08/2021   Procedure: RIGHT BREAST SEED BRACKETED LUMPECTOMY AND SENTINEL LYMPH NODE BIOPSY;  Surgeon: Stark Klein, MD;  Location: [place];  Service: General;  Laterality: Right;  [place] N/A 04/06/2021   Procedure: INSERTION PORT-A-CATH;  Surgeon: Stark Klein, MD;  Location: [place];  Service: General;  Laterality: N/A;   SHOULDER SURGERY Right    Rotator cuff repair   Patient Active Problem List   Diagnosis Date Noted   Dysuria 06/08/2021   Port-A-Cath in place 05/18/2021   Chemotherapy induced diarrhea 04/27/2021   Rash and nonspecific skin eruption 04/27/2021   UTI (urinary tract infection) 04/27/2021   Bone pain due to granulocyte colony stimulating factor 04/27/2021   Transaminitis 04/27/2021   Genetic testing 03/31/2021   Malignant neoplasm of upper-outer quadrant of right breast in  female, estrogen receptor positive ([place]) 03/16/2021   History of cervical dysplasia 02/17/2021   Hot flashes due to menopause 09/16/2019   Typical atrial flutter ([place]) 01/01/2019    PCP: Zack Seal, MD  REFERRING PROVIDER: Shona Simpson PA-C  REFERRING DIAG: Rt breast cancer     THERAPY DIAG:  Malignant neoplasm of upper-outer quadrant of right breast in female, estrogen receptor positive ([place])  Abnormal posture  Stiffness of right shoulder, not elsewhere classified  Pain in right arm  Localized edema  ONSET DATE: 02/2021  Rationale for Evaluation and Treatment Rehabilitation  SUBJECTIVE  SUBJECTIVE STATEMENT: Started radiation yesterday. I was fine after exercises today.   PERTINENT HISTORY:  Patient was diagnosed on 03/04/2021 with right grade II invasive ductal carcinoma breast cancer. It measures 2.2 cm and is located in the upper outer quadrant. It is triple positive with a Ki67 of 25%.  She has a history of breast implants and had a right rotator cuff repair in 2019. Neoadjuvant chemotherapy. Rt lumpectomy and SLNB on 09/08/21 with 5 negative nodes removed.  Drainage noted from incision 09/28/21 with infection. Will be having radiation.    PAIN:  Are you having pain? Yes - near the incision site  PRECAUTIONS: Rt lymphedema risk  WEIGHT BEARING RESTRICTIONS No  FALLS:  Has patient fallen in last 6 months? 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   PRIOR LEVEL OF FUNCTION: Independent  PATIENT GOALS get the cording and seroma gone   OBJECTIVE UPPER EXTREMITY AROM/PROM:   A/PROM RIGHT  03/17/2021    10/08/21 10/14/21 10/18/21  Shoulder extension 58     Shoulder flexion 144 130 - cording evident 145 165  Shoulder abduction 165 115 - cording evident  133 165  Shoulder internal rotation 82     Shoulder external rotation 78                             (Blank rows = not tested)   A/PROM LEFT  03/17/2021 10/08/21  Shoulder extension 53   Shoulder flexion 144   Shoulder abduction 161   Shoulder internal rotation 77   Shoulder external rotation 90                           (Blank rows = not tested)    CERVICAL AROM: All within normal limits    LYMPHEDEMA ASSESSMENTS:    LANDMARK RIGHT  03/17/2021 10/08/21  10 cm proximal to olecranon process 28.9 29  Olecranon process 24.'7 25  10 ' cm proximal to ulnar styloid process 23.1 23.1  Just proximal to ulnar styloid process 15.6 15.6  Across hand at thumb web space 18.2 18.3  At base of 2nd digit 6 6  (Blank rows = not tested)   [facility] LEFT  03/17/2021 10/08/21  10 cm proximal to olecranon process 29.2 29  Olecranon process 24.8 24.8  10 cm proximal to ulnar styloid process 22.3 21.3  Just proximal to ulnar styloid process 15.7 15.5  Across hand at thumb web space 18.2 18.3  At base of 2nd digit 6 6  (Blank rows = not tested)    COGNITION: Overall cognitive status: Within functional limits for tasks assessed   PALPATION: Increased scar tissue under axillary incision, seroma anterior breast, cording from axilla to mid forearm - 2 in antecubital fossa  OBSERVATIONS / OTHER ASSESSMENTS: cording noted in the Rt axilla into forearm , well healed incisions, brusing at breast  POSTURE: WNL  QUICK DASH SURVEY: 34% from 0%   TODAY'S TREATMENT  Date 10/26/21:  Therapeutic Exercises Pulleys into flexion and abduction x 39mn returning therapist demo and tactile cues to decrease Rt scapular compensation  Roll yellow up wall into flexion and abduction x12 Standing horizontal abduction yellow x 10, D2 flexion x 10, bil ER x 10 Rows x10 Bicep  curls  x10 Shoulder flexion and scaption 2# x 10  Shoulder horizontal abduction 1# x 10  Added all to HEP but code not copied before it disappeared Manual Therapy MFR: In Supine to Rt arm in area of cording - no cocoa butter used due to radiation after this P/ROM: In Supine to Rt shoulder into flexion, abduction and D2 to pts available end motion.  Date 10/21/21:  Therapeutic Exercises Pulleys into flexion and abduction x 38mn returning therapist demo and tactile cues to decrease Rt scapular compensation  Roll yellow up wall into flexion and abduction x12 Standing horizontal abduction yellow x 10, D2 flexion x 10, bil ER x 10 Rows x10 Bicep curls  x10 Shoulder flexion and scaption 2# x 10  Shoulder horizontal abduction 1# x 10  Manual Therapy MFR: In Supine to Rt axilla and upper arm in area of cording P/ROM: In Supine to Rt shoulder into flexion, abduction and D2 to pts available end motion.   PATIENT EDUCATION:  Education details: POC, cording, HEP Person educated: Patient Education method: Explanation, Demonstration, Tactile cues, Verbal cues, and Handouts Education comprehension: verbalized understanding, returned demonstration, verbal cues required, and needs further education  HOME EXERCISE PROGRAM: Post -op performed supine for flexion and chest stretch and standing median nerve flossing  ASSESSMENT: CLINICAL IMPRESSION: Pt is progressing TE very well and returning back to lawn mowing activities.  Updated HEP.    OBJECTIVE IMPAIRMENTS decreased activity tolerance, decreased knowledge of use of DME, decreased ROM, decreased strength, and increased edema.   ACTIVITY LIMITATIONS carrying and lifting  PARTICIPATION LIMITATIONS: cleaning, laundry, occupation, and yard work  PERSONAL FACTORS 1 comorbidity: SLNB  are also affecting patient's functional outcome.   REHAB POTENTIAL: Excellent  CLINICAL DECISION MAKING: Stable/uncomplicated  EVALUATION COMPLEXITY:  Low  GOALS: Goals reviewed with patient? Yes   LONG TERM GOALS: Target date: 11/05/2021    Pt will return to baseline AROM Baseline:  Goal status: MET  2.  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  Pt will obtain compression bra for seroma and radiation Baseline:  Goal status: MET  PLAN: PT FREQUENCY: 2x/week  PT DURATION: 4 weeks  PLANNED INTERVENTIONS: Therapeutic exercises, Patient/Family education, Self Care, DME instructions, Manual therapy, and Re-evaluation  PLAN FOR NEXT SESSION: Cont Rt UE PROM, cording work, pulleys and ball roll up wall, add full standing scap/band exercises with slow progression, add minimal weight lifting.   TStark Bray PT 10/26/2021, 11:48 AM

## 2021-10-27 ENCOUNTER — Other Ambulatory Visit: Payer: Self-pay

## 2021-10-27 ENCOUNTER — Ambulatory Visit
Admission: RE | Admit: 2021-10-27 | Discharge: 2021-10-27 | Disposition: A | Discharge status: Home / Self Care | Payer: No Typology Code available for payment source | Source: Ambulatory Visit | Attending: Radiation Oncology | Admitting: Radiation Oncology

## 2021-10-27 DIAGNOSIS — Z5111 Encounter for antineoplastic chemotherapy: Secondary | ICD-10-CM | POA: Diagnosis not present

## 2021-10-27 LAB — RAD ONC ARIA SESSION SUMMARY
Course Elapsed Days: 2
Plan Fractions Treated to Date: 3
Plan Prescribed Dose Per Fraction: 2.66 Gy
Plan Total Fractions Prescribed: 16
Plan Total Prescribed Dose: 42.56 Gy
Reference Point Dosage Given to Date: 7.98 Gy
Reference Point Session Dosage Given: 2.66 Gy
Session Number: 3

## 2021-10-28 ENCOUNTER — Encounter: Payer: Self-pay | Admitting: Rehabilitation

## 2021-10-28 ENCOUNTER — Ambulatory Visit: Payer: No Typology Code available for payment source | Admitting: Rehabilitation

## 2021-10-28 ENCOUNTER — Other Ambulatory Visit: Payer: Self-pay

## 2021-10-28 ENCOUNTER — Ambulatory Visit
Admission: RE | Admit: 2021-10-28 | Discharge: 2021-10-28 | Disposition: A | Discharge status: Home / Self Care | Payer: No Typology Code available for payment source | Source: Ambulatory Visit | Attending: Radiation Oncology | Admitting: Radiation Oncology

## 2021-10-28 DIAGNOSIS — M79601 Pain in right arm: Secondary | ICD-10-CM

## 2021-10-28 DIAGNOSIS — M25611 Stiffness of right shoulder, not elsewhere classified: Secondary | ICD-10-CM

## 2021-10-28 DIAGNOSIS — Z170 Estrogen receptor positive status [ER+]: Secondary | ICD-10-CM

## 2021-10-28 DIAGNOSIS — R600 Localized edema: Secondary | ICD-10-CM

## 2021-10-28 DIAGNOSIS — R293 Abnormal posture: Secondary | ICD-10-CM

## 2021-10-28 DIAGNOSIS — C50411 Malignant neoplasm of upper-outer quadrant of right female breast: Secondary | ICD-10-CM | POA: Diagnosis not present

## 2021-10-28 DIAGNOSIS — Z5111 Encounter for antineoplastic chemotherapy: Secondary | ICD-10-CM | POA: Diagnosis not present

## 2021-10-28 LAB — RAD ONC ARIA SESSION SUMMARY
Course Elapsed Days: 3
Plan Fractions Treated to Date: 4
Plan Prescribed Dose Per Fraction: 2.66 Gy
Plan Total Fractions Prescribed: 16
Plan Total Prescribed Dose: 42.56 Gy
Reference Point Dosage Given to Date: 10.64 Gy
Reference Point Session Dosage Given: 2.66 Gy
Session Number: 4

## 2021-10-28 NOTE — Therapy (Signed)
OUTPATIENT PHYSICAL THERAPY ONCOLOGY TREATMENT NOTE  Patient Name: [name] [name] MRN: [id number] DOB:[birth year], 61 y.o., female Today's Date: 10/28/2021   PT End of Session - 10/28/21 1239     Visit Number 7    Number of Visits 9    Date for PT Re-Evaluation 11/12/21    PT Start Time 2060    PT Stop Time 1240    PT Time Calculation (min) 45 min    Activity Tolerance Patient tolerated treatment well    Behavior During Therapy [facility] for tasks assessed/performed                  Past Medical History:  Diagnosis Date   Abnormal bleeding in menstrual cycle    Abnormal Pap smear of vagina    Breast cancer ([place])    right breast   COVID 08/2019   Endometrial polyp    Hot flashes    Irregular heart beats    PONV (postoperative nausea and vomiting)    Typical atrial flutter ([place]) 01/01/2019   Past Surgical History:  Procedure Laterality Date   BREAST BIOPSY Right 04/05/2021   BREAST IMPLANT REMOVAL Bilateral 2021   breast implants Bilateral 2002   BREAST LUMPECTOMY WITH RADIOACTIVE SEED AND SENTINEL LYMPH NODE BIOPSY Right 09/08/2021   Procedure: RIGHT BREAST SEED BRACKETED LUMPECTOMY AND SENTINEL LYMPH NODE BIOPSY;  Surgeon: Stark Klein, MD;  Location: [place];  Service: General;  Laterality: Right;  [place] N/A 04/06/2021   Procedure: INSERTION PORT-A-CATH;  Surgeon: Stark Klein, MD;  Location: [place];  Service: General;  Laterality: N/A;   SHOULDER SURGERY Right    Rotator cuff repair   Patient Active Problem List   Diagnosis Date Noted   Dysuria 06/08/2021   Port-A-Cath in place 05/18/2021   Chemotherapy induced diarrhea 04/27/2021   Rash and nonspecific skin eruption 04/27/2021   UTI (urinary tract infection) 04/27/2021   Bone pain due to granulocyte colony stimulating factor 04/27/2021   Transaminitis 04/27/2021   Genetic testing 03/31/2021   Malignant neoplasm of upper-outer quadrant of right breast in  female, estrogen receptor positive ([place]) 03/16/2021   History of cervical dysplasia 02/17/2021   Hot flashes due to menopause 09/16/2019   Typical atrial flutter ([place]) 01/01/2019    PCP: Zack Seal, MD  REFERRING PROVIDER: Shona Simpson PA-C  REFERRING DIAG: Rt breast cancer     THERAPY DIAG:  Malignant neoplasm of upper-outer quadrant of right breast in female, estrogen receptor positive ([place])  Pain in right arm  Abnormal posture  Localized edema  Stiffness of right shoulder, not elsewhere classified  ONSET DATE: 02/2021  Rationale for Evaluation and Treatment Rehabilitation  SUBJECTIVE  SUBJECTIVE STATEMENT: She is doing well. She had radiation today. She states she has minimal difficulty when reaching at end range. She has been doing more lawns, she hasn't been doing too much push lawn mover, weed eating, and blower.   PERTINENT HISTORY:  Patient was diagnosed on 03/04/2021 with right grade II invasive ductal carcinoma breast cancer. It measures 2.2 cm and is located in the upper outer quadrant. It is triple positive with a Ki67 of 25%.  She has a history of breast implants and had a right rotator cuff repair in 2019. Neoadjuvant chemotherapy. Rt lumpectomy and SLNB on 09/08/21 with 5 negative nodes removed.  Drainage noted from incision 09/28/21 with infection. Will be having radiation.    PAIN:  Are you having pain? No   PRECAUTIONS: Rt lymphedema risk  WEIGHT BEARING RESTRICTIONS No  FALLS:  Has patient fallen in last 6 months? 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   PRIOR LEVEL OF FUNCTION:  Independent  PATIENT GOALS get the cording and seroma gone   OBJECTIVE UPPER EXTREMITY AROM/PROM:   A/PROM RIGHT  03/17/2021   10/08/21 10/14/21 10/18/21  Shoulder extension 58     Shoulder flexion 144 130 - cording evident 145 165  Shoulder abduction 165 115 - cording evident  133 165  Shoulder internal rotation 82     Shoulder external rotation 78                             (Blank rows = not tested)   A/PROM LEFT  03/17/2021 10/08/21  Shoulder extension 53   Shoulder flexion 144   Shoulder abduction 161   Shoulder internal rotation 77   Shoulder external rotation 90                           (Blank rows = not tested)    CERVICAL AROM: All within normal limits    LYMPHEDEMA ASSESSMENTS:    LANDMARK RIGHT  03/17/2021 10/08/21  10 cm proximal to olecranon process 28.9 29  Olecranon process 24.'7 25  10 ' cm proximal to ulnar styloid process 23.1 23.1  Just proximal to ulnar styloid process 15.6 15.6  Across hand at thumb web space 18.2 18.3  At base of 2nd digit 6 6  (Blank rows = not tested)   [facility] LEFT  03/17/2021 10/08/21  10 cm proximal to olecranon process 29.2 29  Olecranon process 24.8 24.8  10 cm proximal to ulnar styloid process 22.3 21.3  Just proximal to ulnar styloid process 15.7 15.5  Across hand at thumb web space 18.2 18.3  At base of 2nd digit 6 6  (Blank rows = not tested)    COGNITION: Overall cognitive status: Within functional limits for tasks assessed   PALPATION: Increased scar tissue under axillary incision, seroma anterior breast, cording from axilla to mid forearm - 2 in antecubital fossa  OBSERVATIONS / OTHER ASSESSMENTS: cording noted in the Rt axilla into forearm , well healed incisions, brusing at breast  POSTURE: WNL  QUICK DASH SURVEY: 34% from 0%   TODAY'S TREATMENT   Date 10/28/21:  Therapeutic Exercises Pulleys into flexion and abduction x 63mn returning therapist demo and tactile cues to decrease Rt scapular compensation  Roll  yellow up wall into flexion and abduction x12 Standing horizontal abduction yellow x 10, D2 flexion x 10, bil ER x 10 Rows x10 Bicep curls  x10 2# Shoulder flexion and scaption 2# x 10  Shoulder horizontal abduction 2# x 10  Childs pose Manual Therapy MFR: In Supine to Rt arm in area of cording P/ROM: In Supine to Rt shoulder into flexion and abduction   Date 10/26/21:  Therapeutic Exercises Pulleys into flexion and abduction x 26mn returning therapist demo and tactile cues to decrease Rt scapular compensation  Roll yellow up wall into flexion and abduction x12 Standing horizontal abduction yellow x 10, D2 flexion x 10, bil ER x 10 Rows x10 Bicep curls  x10 Shoulder flexion and scaption 2# x 10  Shoulder horizontal abduction 1# x 10  Added all to HEP but code not copied before it disappeared Manual Therapy MFR: In Supine to Rt arm in area of cording - no cocoa butter used due to radiation after this P/ROM: In Supine to Rt shoulder into flexion, abduction and D2 to pts available end motion.  Date 10/21/21:  Therapeutic Exercises Pulleys into flexion and abduction x 368m returning therapist demo and tactile cues to decrease Rt scapular compensation  Roll yellow up wall into flexion and abduction x12 Standing horizontal abduction yellow x 10, D2 flexion x 10, bil ER x 10 Rows x10 Bicep curls  x10 Shoulder flexion and scaption 2# x 10  Shoulder horizontal abduction 1# x 10  Manual Therapy MFR: In Supine to Rt axilla and upper arm in area of cording P/ROM: In Supine to Rt shoulder into flexion, abduction and D2 to pts available end motion.   PATIENT EDUCATION:  Education details: POC, cording, HEP Person educated: Patient Education method: Explanation, Demonstration, Tactile cues, Verbal cues, and Handouts Education comprehension: verbalized understanding, returned demonstration, verbal cues required, and needs further education  HOME EXERCISE PROGRAM: Post -op performed supine  for flexion and chest stretch and standing median nerve flossing  ASSESSMENT: CLINICAL IMPRESSION: Pt is responded well to therapy today. Pt reports she has been doing her exercises everyday. Pt had relief with the manual stretching of the cording. Pt would benefit from skilled therapy to continue to address strength deficits and decreased ROM due to cords.   OBJECTIVE IMPAIRMENTS decreased activity tolerance, decreased knowledge of use of DME, decreased ROM, decreased strength, and increased edema.   ACTIVITY LIMITATIONS carrying and lifting  PARTICIPATION LIMITATIONS: cleaning, laundry, occupation, and yard work  PERSONAL FACTORS 1 comorbidity: SLNB  are also affecting patient's functional outcome.   REHAB POTENTIAL: Excellent  CLINICAL DECISION MAKING: Stable/uncomplicated  EVALUATION COMPLEXITY: Low  GOALS: Goals reviewed with patient? Yes   LONG TERM GOALS: Target date: 11/05/2021    Pt will return to baseline AROM Baseline:  Goal status: MET  2.  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  Pt will obtain compression bra for seroma and radiation Baseline:  Goal status: MET  PLAN: PT FREQUENCY: 2x/week  PT DURATION: 4 weeks  PLANNED INTERVENTIONS: Therapeutic exercises, Patient/Family education, Self Care, DME instructions, Manual therapy, and Re-evaluation  PLAN FOR NEXT SESSION: Cont Rt UE PROM, cording work, pulleys and ball roll up wall, add full standing scap/band exercises with slow progression, add minimal weight lifting and slowly progress increase of reps.  TaRosario JacksStudent-PT 10/28/2021, 12:41 PM

## 2021-10-29 ENCOUNTER — Ambulatory Visit
Admission: RE | Admit: 2021-10-29 | Discharge: 2021-10-29 | Disposition: A | Discharge status: Home / Self Care | Payer: No Typology Code available for payment source | Source: Ambulatory Visit | Attending: Radiation Oncology | Admitting: Radiation Oncology

## 2021-10-29 ENCOUNTER — Other Ambulatory Visit: Payer: Self-pay

## 2021-10-29 DIAGNOSIS — Z5111 Encounter for antineoplastic chemotherapy: Secondary | ICD-10-CM | POA: Diagnosis not present

## 2021-10-29 DIAGNOSIS — Z170 Estrogen receptor positive status [ER+]: Secondary | ICD-10-CM

## 2021-10-29 LAB — RAD ONC ARIA SESSION SUMMARY
Course Elapsed Days: 4
Plan Fractions Treated to Date: 5
Plan Prescribed Dose Per Fraction: 2.66 Gy
Plan Total Fractions Prescribed: 16
Plan Total Prescribed Dose: 42.56 Gy
Reference Point Dosage Given to Date: 13.3 Gy
Reference Point Session Dosage Given: 2.66 Gy
Session Number: 5

## 2021-10-29 MED ORDER — ALRA NON-METALLIC DEODORANT (RAD-ONC)
1.0000 | Freq: Once | TOPICAL | Status: AC
  Administered 2021-10-29: 1 via TOPICAL

## 2021-10-29 MED ORDER — RADIAPLEXRX EX GEL: Freq: Once | CUTANEOUS | Status: AC

## 2021-11-01 ENCOUNTER — Other Ambulatory Visit: Payer: Self-pay

## 2021-11-01 ENCOUNTER — Encounter: Payer: Self-pay | Admitting: *Deleted

## 2021-11-01 ENCOUNTER — Ambulatory Visit
Admission: RE | Admit: 2021-11-01 | Discharge: 2021-11-01 | Disposition: A | Discharge status: Home / Self Care | Payer: No Typology Code available for payment source | Source: Ambulatory Visit | Attending: Radiation Oncology | Admitting: Radiation Oncology

## 2021-11-01 DIAGNOSIS — Z5111 Encounter for antineoplastic chemotherapy: Secondary | ICD-10-CM | POA: Diagnosis not present

## 2021-11-01 LAB — RAD ONC ARIA SESSION SUMMARY
Course Elapsed Days: 7
Plan Fractions Treated to Date: 6
Plan Prescribed Dose Per Fraction: 2.66 Gy
Plan Total Fractions Prescribed: 16
Plan Total Prescribed Dose: 42.56 Gy
Reference Point Dosage Given to Date: 15.96 Gy
Reference Point Session Dosage Given: 2.66 Gy
Session Number: 6

## 2021-11-02 ENCOUNTER — Other Ambulatory Visit: Payer: Self-pay

## 2021-11-02 ENCOUNTER — Ambulatory Visit
Admission: RE | Admit: 2021-11-02 | Discharge: 2021-11-02 | Disposition: A | Discharge status: Home / Self Care | Payer: No Typology Code available for payment source | Source: Ambulatory Visit | Attending: Radiation Oncology | Admitting: Radiation Oncology

## 2021-11-02 ENCOUNTER — Ambulatory Visit: Payer: No Typology Code available for payment source | Admitting: Rehabilitation

## 2021-11-02 DIAGNOSIS — Z5111 Encounter for antineoplastic chemotherapy: Secondary | ICD-10-CM | POA: Diagnosis not present

## 2021-11-02 LAB — RAD ONC ARIA SESSION SUMMARY
Course Elapsed Days: 8
Plan Fractions Treated to Date: 7
Plan Prescribed Dose Per Fraction: 2.66 Gy
Plan Total Fractions Prescribed: 16
Plan Total Prescribed Dose: 42.56 Gy
Reference Point Dosage Given to Date: 18.62 Gy
Reference Point Session Dosage Given: 2.66 Gy
Session Number: 7

## 2021-11-03 ENCOUNTER — Ambulatory Visit
Admission: RE | Admit: 2021-11-03 | Discharge: 2021-11-03 | Disposition: A | Discharge status: Home / Self Care | Payer: No Typology Code available for payment source | Source: Ambulatory Visit | Attending: Radiation Oncology | Admitting: Radiation Oncology

## 2021-11-03 ENCOUNTER — Other Ambulatory Visit: Payer: Self-pay

## 2021-11-03 DIAGNOSIS — Z5111 Encounter for antineoplastic chemotherapy: Secondary | ICD-10-CM | POA: Diagnosis not present

## 2021-11-03 LAB — RAD ONC ARIA SESSION SUMMARY
Course Elapsed Days: 9
Plan Fractions Treated to Date: 8
Plan Prescribed Dose Per Fraction: 2.66 Gy
Plan Total Fractions Prescribed: 16
Plan Total Prescribed Dose: 42.56 Gy
Reference Point Dosage Given to Date: 21.28 Gy
Reference Point Session Dosage Given: 2.66 Gy
Session Number: 8

## 2021-11-04 ENCOUNTER — Ambulatory Visit
Admission: RE | Admit: 2021-11-04 | Discharge: 2021-11-04 | Disposition: A | Discharge status: Home / Self Care | Payer: No Typology Code available for payment source | Source: Ambulatory Visit | Attending: Radiation Oncology | Admitting: Radiation Oncology

## 2021-11-04 ENCOUNTER — Ambulatory Visit: Payer: No Typology Code available for payment source

## 2021-11-04 ENCOUNTER — Other Ambulatory Visit: Payer: Self-pay

## 2021-11-04 ENCOUNTER — Other Ambulatory Visit: Payer: Self-pay | Admitting: *Deleted

## 2021-11-04 DIAGNOSIS — C50411 Malignant neoplasm of upper-outer quadrant of right female breast: Secondary | ICD-10-CM

## 2021-11-04 DIAGNOSIS — R600 Localized edema: Secondary | ICD-10-CM

## 2021-11-04 DIAGNOSIS — R293 Abnormal posture: Secondary | ICD-10-CM

## 2021-11-04 DIAGNOSIS — M25611 Stiffness of right shoulder, not elsewhere classified: Secondary | ICD-10-CM

## 2021-11-04 DIAGNOSIS — Z5111 Encounter for antineoplastic chemotherapy: Secondary | ICD-10-CM | POA: Diagnosis not present

## 2021-11-04 DIAGNOSIS — M79601 Pain in right arm: Secondary | ICD-10-CM

## 2021-11-04 LAB — RAD ONC ARIA SESSION SUMMARY
Course Elapsed Days: 10
Plan Fractions Treated to Date: 9
Plan Prescribed Dose Per Fraction: 2.66 Gy
Plan Total Fractions Prescribed: 16
Plan Total Prescribed Dose: 42.56 Gy
Reference Point Dosage Given to Date: 23.94 Gy
Reference Point Session Dosage Given: 2.66 Gy
Session Number: 9

## 2021-11-04 NOTE — Therapy (Signed)
OUTPATIENT PHYSICAL THERAPY ONCOLOGY TREATMENT NOTE  Patient Name: [name] [name] MRN: [id number] DOB:October 09, 1960, 61 y.o., female Today's Date: 11/04/2021   PT End of Session - 11/04/21 0912     Visit Number 8    Number of Visits 9    Date for PT Re-Evaluation 11/12/21    PT Start Time 0909    PT Stop Time 1003    PT Time Calculation (min) 54 min    Activity Tolerance Patient tolerated treatment well    Behavior During Therapy [facility] for tasks assessed/performed                  Past Medical History:  Diagnosis Date   Abnormal bleeding in menstrual cycle    Abnormal Pap smear of vagina    Breast cancer ([place])    right breast   COVID 08/2019   Endometrial polyp    Hot flashes    Irregular heart beats    PONV (postoperative nausea and vomiting)    Typical atrial flutter ([place]) 01/01/2019   Past Surgical History:  Procedure Laterality Date   BREAST BIOPSY Right 04/05/2021   BREAST IMPLANT REMOVAL Bilateral 2021   breast implants Bilateral 2002   BREAST LUMPECTOMY WITH RADIOACTIVE SEED AND SENTINEL LYMPH NODE BIOPSY Right 09/08/2021   Procedure: RIGHT BREAST SEED BRACKETED LUMPECTOMY AND SENTINEL LYMPH NODE BIOPSY;  Surgeon: Stark Klein, MD;  Location: [place];  Service: General;  Laterality: Right;  [place] N/A 04/06/2021   Procedure: INSERTION PORT-A-CATH;  Surgeon: Stark Klein, MD;  Location: [place];  Service: General;  Laterality: N/A;   SHOULDER SURGERY Right    Rotator cuff repair   Patient Active Problem List   Diagnosis Date Noted   Dysuria 06/08/2021   Port-A-Cath in place 05/18/2021   Chemotherapy induced diarrhea 04/27/2021   Rash and nonspecific skin eruption 04/27/2021   UTI (urinary tract infection) 04/27/2021   Bone pain due to granulocyte colony stimulating factor 04/27/2021   Transaminitis 04/27/2021   Genetic testing 03/31/2021   Malignant neoplasm of upper-outer quadrant of right breast in  female, estrogen receptor positive ([place]) 03/16/2021   History of cervical dysplasia 02/17/2021   Hot flashes due to menopause 09/16/2019   Typical atrial flutter ([place]) 01/01/2019    PCP: Zack Seal, MD  REFERRING PROVIDER: Shona Simpson PA-C  REFERRING DIAG: Rt breast cancer     THERAPY DIAG:  Malignant neoplasm of upper-outer quadrant of right breast in female, estrogen receptor positive ([place])  Pain in right arm  Abnormal posture  Localized edema  Stiffness of right shoulder, not elsewhere classified  ONSET DATE: 02/2021  Rationale for Evaluation and Treatment Rehabilitation  SUBJECTIVE  SUBJECTIVE STATEMENT: I haven't done my exercises like I should. But I've been able to work more because I've been feeling better.    PERTINENT HISTORY:  Patient was diagnosed on 03/04/2021 with right grade II invasive ductal carcinoma breast cancer. It measures 2.2 cm and is located in the upper outer quadrant. It is triple positive with a Ki67 of 25%.  She has a history of breast implants and had a right rotator cuff repair in 2019. Neoadjuvant chemotherapy. Rt lumpectomy and SLNB on 09/08/21 with 5 negative nodes removed.  Drainage noted from incision 09/28/21 with infection. Will be having radiation.    PAIN:  Are you having pain? No   PRECAUTIONS: Rt lymphedema risk  WEIGHT BEARING RESTRICTIONS No  FALLS:  Has patient fallen in last 6 months? 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   PRIOR LEVEL OF FUNCTION: Independent  PATIENT GOALS get the cording and seroma gone   OBJECTIVE UPPER EXTREMITY AROM/PROM:    A/PROM RIGHT  03/17/2021   10/08/21 10/14/21 10/18/21  Shoulder extension 58     Shoulder flexion 144 130 - cording evident 145 165  Shoulder abduction 165 115 - cording evident  133 165  Shoulder internal rotation 82     Shoulder external rotation 78                             (Blank rows = not tested)   A/PROM LEFT  03/17/2021 10/08/21  Shoulder extension 53   Shoulder flexion 144   Shoulder abduction 161   Shoulder internal rotation 77   Shoulder external rotation 90                           (Blank rows = not tested)    CERVICAL AROM: All within normal limits    LYMPHEDEMA ASSESSMENTS:    LANDMARK RIGHT  03/17/2021 10/08/21  10 cm proximal to olecranon process 28.9 29  Olecranon process 24.'7 25  10 ' cm proximal to ulnar styloid process 23.1 23.1  Just proximal to ulnar styloid process 15.6 15.6  Across hand at thumb web space 18.2 18.3  At base of 2nd digit 6 6  (Blank rows = not tested)   [facility] LEFT  03/17/2021 10/08/21  10 cm proximal to olecranon process 29.2 29  Olecranon process 24.8 24.8  10 cm proximal to ulnar styloid process 22.3 21.3  Just proximal to ulnar styloid process 15.7 15.5  Across hand at thumb web space 18.2 18.3  At base of 2nd digit 6 6  (Blank rows = not tested)    COGNITION: Overall cognitive status: Within functional limits for tasks assessed   PALPATION: Increased scar tissue under axillary incision, seroma anterior breast, cording from axilla to mid forearm - 2 in antecubital fossa  OBSERVATIONS / OTHER ASSESSMENTS: cording noted in the Rt axilla into forearm , well healed incisions, brusing at breast  POSTURE: WNL  QUICK DASH SURVEY: 34% from 0%   TODAY'S TREATMENT   Date 11/04/21:  Therapeutic Exercises Pulleys into flexion and abduction x 83mn returning therapist demo and tactile cues to decrease Rt scapular compensation  Roll yellow up wall into flexion and abduction x10 each with 2# on Rt wrist All standing Standing horizontal  abduction yellow 2 x 10, bil ER 2 x 10 and then D2 flexion x 10 with 1# on Rt wrist Rows 2x10 with yellow theraband  Bicep curls  2x10 2# Ardine Eng pose in front and then each side 3x, 10 sec hold each returning therapist demo for new side to side stretch Manual Therapy MFR: In Supine to Rt arm in areas of cording P/ROM: In Supine to Rt shoulder into flexion, abduction and D2   Date 10/28/21:  Therapeutic Exercises Pulleys into flexion and abduction x 32mn returning therapist demo and tactile cues to decrease Rt scapular compensation  Roll yellow up wall into flexion and abduction x12 Standing horizontal abduction yellow x 10, D2 flexion x 10, bil ER x 10 Rows x10 Bicep curls  x10 2# Shoulder flexion and scaption 2# x 10  Shoulder horizontal abduction 2# x 10  Childs pose Manual Therapy MFR: In Supine to Rt arm in area of cording P/ROM: In Supine to Rt shoulder into flexion and abduction   Date 10/26/21:  Therapeutic Exercises Pulleys into flexion and abduction x 215m returning therapist demo and tactile cues to decrease Rt scapular compensation  Roll yellow up wall into flexion and abduction x12 Standing horizontal abduction yellow x 10, D2 flexion x 10, bil ER x 10 Rows x10 Bicep curls  x10 Shoulder flexion and scaption 2# x 10  Shoulder horizontal abduction 1# x 10  Added all to HEP but code not copied before it disappeared Manual Therapy MFR: In Supine to Rt arm in area of cording - no cocoa butter used due to radiation after this P/ROM: In Supine to Rt shoulder into flexion, abduction and D2 to pts available end motion.    PATIENT EDUCATION:  Education details: POC, cording, HEP Person educated: Patient Education method: Explanation, Demonstration, Tactile cues, Verbal cues, and Handouts Education comprehension: verbalized understanding, returned demonstration, verbal cues required, and needs further education  HOME EXERCISE PROGRAM: Post -op performed supine for flexion  and chest stretch and standing median nerve flossing  ASSESSMENT: CLINICAL IMPRESSION: Progressed pt with increased reps with bil UE exercises and added weight with ball roll up wall exs. Then continued with manual therapy working to decrease Rt upper quadrant tightness.  Two pops were noted by therapist and pt at antecubital fossa cording and then less numerous after.  OBJECTIVE IMPAIRMENTS decreased activity tolerance, decreased knowledge of use of DME, decreased ROM, decreased strength, and increased edema.   ACTIVITY LIMITATIONS carrying and lifting  PARTICIPATION LIMITATIONS: cleaning, laundry, occupation, and yard work  PERSONAL FACTORS 1 comorbidity: SLNB  are also affecting patient's functional outcome.   REHAB POTENTIAL: Excellent  CLINICAL DECISION MAKING: Stable/uncomplicated  EVALUATION COMPLEXITY: Low  GOALS: Goals reviewed with patient? Yes   LONG TERM GOALS: Target date: 11/05/2021    Pt will return to baseline AROM Baseline:  Goal status: MET  2.  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  Pt will obtain compression bra for seroma and radiation Baseline:  Goal status: MET  PLAN: PT FREQUENCY: 2x/week  PT DURATION: 4 weeks  PLANNED INTERVENTIONS: Therapeutic exercises, Patient/Family education, Self Care, DME instructions, Manual therapy, and Re-evaluation  PLAN FOR NEXT SESSION: Cont Rt UE PROM, cording work, pulleys and ball roll up wall, add full standing scap/band exercises with slow progression, add minimal weight lifting and slowly progress increase of reps.  RoOtelia LimesPTA 11/04/2021, 10:04 AM

## 2021-11-05 ENCOUNTER — Ambulatory Visit
Admission: RE | Admit: 2021-11-05 | Discharge: 2021-11-05 | Disposition: A | Discharge status: Home / Self Care | Payer: No Typology Code available for payment source | Source: Ambulatory Visit | Attending: Radiation Oncology | Admitting: Radiation Oncology

## 2021-11-05 ENCOUNTER — Other Ambulatory Visit: Payer: Self-pay

## 2021-11-05 DIAGNOSIS — Z5111 Encounter for antineoplastic chemotherapy: Secondary | ICD-10-CM | POA: Diagnosis not present

## 2021-11-05 LAB — RAD ONC ARIA SESSION SUMMARY
Course Elapsed Days: 11
Plan Fractions Treated to Date: 10
Plan Prescribed Dose Per Fraction: 2.66 Gy
Plan Total Fractions Prescribed: 16
Plan Total Prescribed Dose: 42.56 Gy
Reference Point Dosage Given to Date: 26.6 Gy
Reference Point Session Dosage Given: 2.66 Gy
Session Number: 10

## 2021-11-08 ENCOUNTER — Other Ambulatory Visit: Payer: Self-pay

## 2021-11-08 ENCOUNTER — Inpatient Hospital Stay
Payer: No Typology Code available for payment source | Attending: Hematology and Oncology | Admitting: Hematology and Oncology

## 2021-11-08 ENCOUNTER — Ambulatory Visit
Admission: RE | Admit: 2021-11-08 | Discharge: 2021-11-08 | Disposition: A | Discharge status: Home / Self Care | Payer: No Typology Code available for payment source | Source: Ambulatory Visit | Attending: Radiation Oncology | Admitting: Radiation Oncology

## 2021-11-08 ENCOUNTER — Inpatient Hospital Stay: Payer: No Typology Code available for payment source

## 2021-11-08 ENCOUNTER — Encounter: Payer: Self-pay | Admitting: Hematology and Oncology

## 2021-11-08 VITALS — BP 153/91 | HR 74 | Temp 97.8°F | Resp 18

## 2021-11-08 VITALS — BP 148/79 | HR 83 | Temp 97.7°F | Resp 18 | Ht 62.0 in | Wt 149.4 lb

## 2021-11-08 DIAGNOSIS — Z170 Estrogen receptor positive status [ER+]: Secondary | ICD-10-CM | POA: Diagnosis not present

## 2021-11-08 DIAGNOSIS — C50411 Malignant neoplasm of upper-outer quadrant of right female breast: Secondary | ICD-10-CM | POA: Insufficient documentation

## 2021-11-08 DIAGNOSIS — Z5111 Encounter for antineoplastic chemotherapy: Secondary | ICD-10-CM | POA: Diagnosis not present

## 2021-11-08 DIAGNOSIS — Z5112 Encounter for antineoplastic immunotherapy: Secondary | ICD-10-CM | POA: Insufficient documentation

## 2021-11-08 DIAGNOSIS — Z95828 Presence of other vascular implants and grafts: Secondary | ICD-10-CM

## 2021-11-08 LAB — CBC WITH DIFFERENTIAL (CANCER CENTER ONLY)
Abs Immature Granulocytes: 0.01 10*3/uL (ref 0.00–0.07)
Basophils Absolute: 0 10*3/uL (ref 0.0–0.1)
Basophils Relative: 1 %
Eosinophils Absolute: 0.1 10*3/uL (ref 0.0–0.5)
Eosinophils Relative: 4 %
HCT: 35.7 % — ABNORMAL LOW (ref 36.0–46.0)
Hemoglobin: 12.4 g/dL (ref 12.0–15.0)
Immature Granulocytes: 0 %
Lymphocytes Relative: 28 %
Lymphs Abs: 0.9 10*3/uL (ref 0.7–4.0)
MCH: 32.2 pg (ref 26.0–34.0)
MCHC: 34.7 g/dL (ref 30.0–36.0)
MCV: 92.7 fL (ref 80.0–100.0)
Monocytes Absolute: 0.4 10*3/uL (ref 0.1–1.0)
Monocytes Relative: 11 %
Neutro Abs: 1.9 10*3/uL (ref 1.7–7.7)
Neutrophils Relative %: 56 %
Platelet Count: 208 10*3/uL (ref 150–400)
RBC: 3.85 MIL/uL — ABNORMAL LOW (ref 3.87–5.11)
RDW: 12.9 % (ref 11.5–15.5)
WBC Count: 3.3 10*3/uL — ABNORMAL LOW (ref 4.0–10.5)
nRBC: 0 % (ref 0.0–0.2)

## 2021-11-08 LAB — CMP (CANCER CENTER ONLY)
ALT: 12 U/L (ref 0–44)
AST: 16 U/L (ref 15–41)
Albumin: 4 g/dL (ref 3.5–5.0)
Alkaline Phosphatase: 70 U/L (ref 38–126)
Anion gap: 7 (ref 5–15)
BUN: 16 mg/dL (ref 8–23)
CO2: 27 mmol/L (ref 22–32)
Calcium: 9.4 mg/dL (ref 8.9–10.3)
Chloride: 108 mmol/L (ref 98–111)
Creatinine: 0.82 mg/dL (ref 0.44–1.00)
GFR, Estimated: >60 mL/min (ref >60)
Glucose, Bld: 114 mg/dL — ABNORMAL HIGH (ref 70–99)
Potassium: 3.6 mmol/L (ref 3.5–5.1)
Sodium: 142 mmol/L (ref 135–145)
Total Bilirubin: 0.4 mg/dL (ref 0.3–1.2)
Total Protein: 6.4 g/dL — ABNORMAL LOW (ref 6.5–8.1)

## 2021-11-08 LAB — RAD ONC ARIA SESSION SUMMARY
Course Elapsed Days: 14
Plan Fractions Treated to Date: 11
Plan Prescribed Dose Per Fraction: 2.66 Gy
Plan Total Fractions Prescribed: 16
Plan Total Prescribed Dose: 42.56 Gy
Reference Point Dosage Given to Date: 29.26 Gy
Reference Point Session Dosage Given: 2.66 Gy
Session Number: 11

## 2021-11-08 MED ORDER — SODIUM CHLORIDE 0.9% FLUSH
10.0000 mL | Freq: Once | INTRAVENOUS | Status: AC
  Administered 2021-11-08: 10 mL

## 2021-11-08 MED ORDER — LETROZOLE 2.5 MG PO TABS: 2.5000 mg | ORAL_TABLET | Freq: Every day | ORAL | 3 refills | Status: DC

## 2021-11-08 MED ORDER — SODIUM CHLORIDE 0.9 % IV SOLN
420.0000 mg | Freq: Once | INTRAVENOUS | Status: AC
  Administered 2021-11-08: 420 mg via INTRAVENOUS
  Filled 2021-11-08: qty 14

## 2021-11-08 MED ORDER — SODIUM CHLORIDE 0.9 % IV SOLN: Freq: Once | INTRAVENOUS | Status: AC

## 2021-11-08 MED ORDER — ACETAMINOPHEN 325 MG PO TABS
650.0000 mg | ORAL_TABLET | Freq: Once | ORAL | Status: AC
  Administered 2021-11-08: 650 mg via ORAL
  Filled 2021-11-08: qty 2

## 2021-11-08 MED ORDER — DIPHENHYDRAMINE HCL 25 MG PO CAPS: 50.0000 mg | ORAL_CAPSULE | Freq: Once | ORAL | Status: DC

## 2021-11-08 MED ORDER — TRASTUZUMAB-ANNS CHEMO 150 MG IV SOLR
6.0000 mg/kg | Freq: Once | INTRAVENOUS | Status: AC
  Administered 2021-11-08: 399 mg via INTRAVENOUS
  Filled 2021-11-08: qty 19

## 2021-11-08 MED ORDER — SODIUM CHLORIDE 0.9% FLUSH
10.0000 mL | INTRAVENOUS | Status: DC | PRN
  Administered 2021-11-08: 10 mL

## 2021-11-08 MED ORDER — HEPARIN SOD (PORK) LOCK FLUSH 100 UNIT/ML IV SOLN
500.0000 [IU] | Freq: Once | INTRAVENOUS | Status: AC | PRN
  Administered 2021-11-08: 500 [IU]

## 2021-11-08 NOTE — Progress Notes (Signed)
[facility] Follow up:    [name] [name], [place] [id number]   DIAGNOSIS:  Cancer Staging  Malignant neoplasm of upper-outer quadrant of right breast in female, estrogen receptor positive ([place]) Staging form: Breast, AJCC 8th Edition - Clinical stage from 03/17/2021: Stage IB (cT2, cN0, cM0, G2, ER+, PR+, HER2+) - Unsigned Stage prefix: Initial diagnosis Method of lymph node assessment: Clinical Histologic grading system: 3 grade system   SUMMARY OF ONCOLOGIC HISTORY: Oncology History  Malignant neoplasm of upper-outer quadrant of right breast in female, estrogen receptor positive ([place])  03/16/2021 Initial Diagnosis   Malignant neoplasm of upper-outer quadrant of right breast in female, estrogen receptor positive ([place])   04/07/2021 - 07/23/2021 Chemotherapy   Patient is on Treatment Plan : BREAST  Docetaxel + Carboplatin + Trastuzumab + Pertuzumab  (TCHP) q21d       Genetic Testing   [facility] CustomNext Panel was Negative. Report date is 03/28/2021.  The CustomNext-Cancer+RNAinsight panel offered by [facility] includes sequencing and rearrangement analysis for the following 47 genes:  APC, ATM, AXIN2, BARD1, BMPR1A, BRCA1, BRCA2, BRIP1, CDH1, CDK4, CDKN2A, CHEK2, CTNNA1, DICER1, EPCAM, GREM1, HOXB13, KIT, MEN1, MLH1, MSH2, MSH3, MSH6, MUTYH, NBN, NF1, NTHL1, PALB2, PDGFRA, PMS2, POLD1, POLE, PTEN, RAD50, RAD51C, RAD51D, SDHA, SDHB, SDHC, SDHD, SMAD4, SMARCA4, STK11, TP53, TSC1, TSC2, and VHL.  RNA data is routinely analyzed for use in variant interpretation for all genes.   08/13/2021 - 09/03/2021 Chemotherapy   Patient is on Treatment Plan : BREAST Trastuzumab  + Pertuzumab q21d x 13 cycles     08/13/2021 -  Chemotherapy   Patient is on Treatment Plan : BREAST Trastuzumab  + Pertuzumab q21d x 13 cycles     09/08/2021 Surgery   Right breast lumpectomy:      CURRENT THERAPY: TCHP, completed 6 cycles, currently on Herceptin and  Perjeta awaiting surgery.  INTERVAL HISTORY:  [name] [name] is here for follow-up.   Since last visit, she is undergoing radiation, tolerating it very well.  She is working with physical therapy to help her cording.  Her hair has been growing.  She feels well.  She still struggles with hot flashes which have been ongoing for almost 8 years and hence went on hormone replacement therapy which was tremendously helpful.  Besides the hot flashes and the sleeplessness, she has been doing quite well overall.  She is very active.  No chest pain, cough, shortness of breath or lower extremity swelling. Rest of the pertinent 10 point ROS reviewed and negative.   Patient Active Problem List   Diagnosis Date Noted   Dysuria 06/08/2021   Port-A-Cath in place 05/18/2021   Chemotherapy induced diarrhea 04/27/2021   Rash and nonspecific skin eruption 04/27/2021   UTI (urinary tract infection) 04/27/2021   Bone pain due to granulocyte colony stimulating factor 04/27/2021   Transaminitis 04/27/2021   Genetic testing 03/31/2021   Malignant neoplasm of upper-outer quadrant of right breast in female, estrogen receptor positive ([place]) 03/16/2021   History of cervical dysplasia 02/17/2021   Hot flashes due to menopause 09/16/2019   Typical atrial flutter ([place]) 01/01/2019    is allergic to bee venom.  MEDICAL HISTORY: Past Medical History:  Diagnosis Date   Abnormal bleeding in menstrual cycle    Abnormal Pap smear of vagina    Breast cancer ([place])    right breast   COVID 08/2019   Endometrial polyp    Hot flashes  Irregular heart beats    PONV (postoperative nausea and vomiting)    Typical atrial flutter ([place]) 01/01/2019    SURGICAL HISTORY: Past Surgical History:  Procedure Laterality Date   BREAST BIOPSY Right 04/05/2021   BREAST IMPLANT REMOVAL Bilateral 2021   breast implants Bilateral 2002   BREAST LUMPECTOMY WITH RADIOACTIVE SEED AND SENTINEL LYMPH NODE BIOPSY Right 09/08/2021   Procedure:  RIGHT BREAST SEED BRACKETED LUMPECTOMY AND SENTINEL LYMPH NODE BIOPSY;  Surgeon: Stark Klein, MD;  Location: [place];  Service: General;  Laterality: Right;  [place] N/A 04/06/2021   Procedure: INSERTION PORT-A-CATH;  Surgeon: Stark Klein, MD;  Location: [place];  Service: General;  Laterality: N/A;   SHOULDER SURGERY Right    Rotator cuff repair    SOCIAL HISTORY: Social History   Socioeconomic History   Marital status: Married    Spouse name: Not on file   Number of children: 2   Years of education: Not on file   Highest education level: Not on file  Occupational History   Not on file  Tobacco Use   Smoking status: Never   Smokeless tobacco: Never  Vaping Use   Vaping Use: Never used  Substance and Sexual Activity   Alcohol use: Yes    Alcohol/week: 14.0 standard drinks of alcohol    Types: 14 Standard drinks or equivalent per week    Comment: 2 white claws every night with dinner   Drug use: Not Currently   Sexual activity: Not on file  Other Topics Concern   Not on file  Social History Narrative   Not on file   Social Determinants of Health   Financial Resource Strain: Not on file  Food Insecurity: Not on file  Transportation Needs: Not on file  Physical Activity: Not on file  Stress: Not on file  Social Connections: Not on file  Intimate Partner Violence: Not on file    FAMILY HISTORY: Family History  Problem Relation Age of Onset   Heart attack Mother     Review of Systems  Constitutional:  Positive for fatigue. Negative for appetite change, chills, fever and unexpected weight change.  HENT:   Negative for hearing loss, lump/mass and trouble swallowing.   Eyes:  Negative for eye problems and icterus.  Respiratory:  Negative for chest tightness, cough and shortness of breath.   Cardiovascular:  Negative for chest pain, leg swelling and palpitations.  Gastrointestinal:  Positive for diarrhea. Negative  for abdominal distention, abdominal pain, constipation, nausea and vomiting.  Endocrine: Negative for hot flashes.  Genitourinary:  Negative for difficulty urinating.   Musculoskeletal:  Negative for arthralgias.  Skin:  Negative for itching and rash.  Neurological:  Negative for dizziness, extremity weakness, headaches and numbness.  Hematological:  Negative for adenopathy. Does not bruise/bleed easily.  Psychiatric/Behavioral:  Negative for depression. The patient is not nervous/anxious.       PHYSICAL EXAMINATION  ECOG PERFORMANCE STATUS: 1 - Symptomatic but completely ambulatory  Vitals:   11/08/21 0942  BP: (!) 148/79  Pulse: 83  Resp: 18  Temp: 97.7 F (36.5 C)  SpO2: 99%     Physical Exam Constitutional:      General: She is not in acute distress.    Appearance: Normal appearance. She is not toxic-appearing.  HENT:     Head: Normocephalic and atraumatic.  Eyes:     General: No scleral icterus. Cardiovascular:     Rate  and Rhythm: Normal rate and regular rhythm.     Heart sounds: Normal heart sounds.  Pulmonary:     Effort: Pulmonary effort is normal.     Breath sounds: Normal breath sounds.  Abdominal:     General: Abdomen is flat. Bowel sounds are normal. There is no distension.     Palpations: Abdomen is soft.     Tenderness: There is no abdominal tenderness.  Musculoskeletal:        General: No swelling.  Lymphadenopathy:     Cervical: No cervical adenopathy.  Skin:    General: Skin is warm and dry.     Findings: No rash.     Comments: Cording felt left axilla.  Neurological:     General: No focal deficit present.     Mental Status: She is alert.  Psychiatric:        Mood and Affect: Mood normal.        Behavior: Behavior normal.     LABORATORY DATA:  CBC    Component Value Date/Time   WBC 3.3 (L) 11/08/2021 0915   WBC 3.7 (L) 09/01/2021 1106   RBC 3.85 (L) 11/08/2021 0915   HGB 12.4 11/08/2021 0915   HGB 12.9 12/03/2018 1624   HCT 35.7  (L) 11/08/2021 0915   HCT 38.5 12/03/2018 1624   PLT 208 11/08/2021 0915   PLT 255 12/03/2018 1624   MCV 92.7 11/08/2021 0915   MCV 92 12/03/2018 1624   MCH 32.2 11/08/2021 0915   MCHC 34.7 11/08/2021 0915   RDW 12.9 11/08/2021 0915   RDW 12.3 12/03/2018 1624   LYMPHSABS 0.9 11/08/2021 0915   MONOABS 0.4 11/08/2021 0915   EOSABS 0.1 11/08/2021 0915   BASOSABS 0.0 11/08/2021 0915    CMP     Component Value Date/Time   NA 140 10/19/2021 0844   NA 143 12/03/2018 1624   K 3.9 10/19/2021 0844   CL 107 10/19/2021 0844   CO2 28 10/19/2021 0844   GLUCOSE 133 (H) 10/19/2021 0844   BUN 18 10/19/2021 0844   BUN 17 12/03/2018 1624   CREATININE 0.82 10/19/2021 0844   CALCIUM 9.1 10/19/2021 0844   PROT 6.3 (L) 10/19/2021 0844   ALBUMIN 4.0 10/19/2021 0844   AST 17 10/19/2021 0844   ALT 15 10/19/2021 0844   ALKPHOS 56 10/19/2021 0844   BILITOT 0.4 10/19/2021 0844   GFRNONAA >60 10/19/2021 0844   GFRAA 97 12/03/2018 1624    ASSESSMENT and THERAPY PLAN:   # Stage Ib Triple Positive Right Breast Cancer: -Currently receiving neoadjuvant chemotherapy with taxotere, carboplatin, herceptin and perjeta.  -She completed 6 cycles of neoadjuvant TCHP -MRI done on 07/08/2021 with significant positive response to neoadjuvant chemotherapy, no evidence of metastatic lymphadenopathy.   She is status post right partial mastectomy on August 16.  Pathology showed complete pathologic response.  We have today discussed about continuing Herceptin and pertuzumab for the rest of the year to complete total 1 year of maintenance. Echocardiogram ordered for now. We have also discussed today about initiating antiestrogen therapy.  I have today discussed about mechanism of action of aromatase inhibitors, adverse effects including but not limited to hot flashes, vaginal dryness, arthralgias and bone density loss.  I have recommended baseline bone density, ordered today.  She will be completing radiation in about 2  weeks.  She was recommended to wait until after she finishes radiation and start letrozole.  She expressed understanding.  She will continue on Herceptin and pertuzumab maintenance as  recommended. She will continue to work with physical therapy.  Total time spent: 30 minutes including history, physical exam, review of records, counseling and coordination of care  Benay Pike MD

## 2021-11-08 NOTE — Patient Instructions (Signed)
[facility] MEDICAL ONCOLOGY  Discharge Instructions: Thank you for choosing [facility] to provide your oncology and hematology care.   If you have a lab appointment with the Cancer Center, please go directly to the Cancer Center and check in at the registration area.   Wear comfortable clothing and clothing appropriate for easy access to any Portacath or PICC line.   We strive to give you quality time with your provider. You may need to reschedule your appointment if you arrive late (15 or more minutes).  Arriving late affects you and other patients whose appointments are after yours.  Also, if you miss three or more appointments without notifying the office, you may be dismissed from the clinic at the provider's discretion.      For prescription refill requests, have your pharmacy contact our office and allow 72 hours for refills to be completed.    Today you received the following chemotherapy and/or immunotherapy agents: trastuzumab, pertuzumab      To help prevent nausea and vomiting after your treatment, we encourage you to take your nausea medication as directed.  BELOW ARE SYMPTOMS THAT SHOULD BE REPORTED IMMEDIATELY: *FEVER GREATER THAN 100.4 F (38 C) OR HIGHER *CHILLS OR SWEATING *NAUSEA AND VOMITING THAT IS NOT CONTROLLED WITH YOUR NAUSEA MEDICATION *UNUSUAL SHORTNESS OF BREATH *UNUSUAL BRUISING OR BLEEDING *URINARY PROBLEMS (pain or burning when urinating, or frequent urination) *BOWEL PROBLEMS (unusual diarrhea, constipation, pain near the anus) TENDERNESS IN MOUTH AND THROAT WITH OR WITHOUT PRESENCE OF ULCERS (sore throat, sores in mouth, or a toothache) UNUSUAL RASH, SWELLING OR PAIN  UNUSUAL VAGINAL DISCHARGE OR ITCHING   Items with * indicate a potential emergency and should be followed up as soon as possible or go to the Emergency Department if any problems should occur.  Please show the CHEMOTHERAPY ALERT CARD or IMMUNOTHERAPY ALERT CARD  at check-in to the Emergency Department and triage nurse.  Should you have questions after your visit or need to cancel or reschedule your appointment, please contact [facility]ICAL ONCOLOGY  Dept: [phone]  and follow the prompts.  Office hours are 8:00 a.m. to 4:30 p.m. Monday - Friday. Please note that voicemails left after 4:00 p.m. may not be returned until the following business day.  We are closed weekends and major holidays. You have access to a nurse at all times for urgent questions. Please call the main number to the clinic Dept: [phone] and follow the prompts.   For any non-urgent questions, you may also contact your provider using MyChart. We now offer e-Visits for anyone 18 and older to request care online for non-urgent symptoms. For details visit [email or web address].   Also download the MyChart app! Go to the app store, search "MyChart", open the app, select [facility], and log in with your MyChart username and password.  Masks are optional in the cancer centers. If you would like for your care team to wear a mask while they are taking care of you, please let them know. You may have one support person who is at least 63 years old accompany you for your appointments. 

## 2021-11-09 ENCOUNTER — Other Ambulatory Visit: Payer: Self-pay

## 2021-11-09 ENCOUNTER — Ambulatory Visit
Admission: RE | Admit: 2021-11-09 | Discharge: 2021-11-09 | Disposition: A | Discharge status: Home / Self Care | Payer: No Typology Code available for payment source | Source: Ambulatory Visit | Attending: Radiation Oncology | Admitting: Radiation Oncology

## 2021-11-09 DIAGNOSIS — Z5111 Encounter for antineoplastic chemotherapy: Secondary | ICD-10-CM | POA: Diagnosis not present

## 2021-11-09 LAB — RAD ONC ARIA SESSION SUMMARY
Course Elapsed Days: 15
Plan Fractions Treated to Date: 12
Plan Prescribed Dose Per Fraction: 2.66 Gy
Plan Total Fractions Prescribed: 16
Plan Total Prescribed Dose: 42.56 Gy
Reference Point Dosage Given to Date: 31.92 Gy
Reference Point Session Dosage Given: 2.66 Gy
Session Number: 12

## 2021-11-10 ENCOUNTER — Other Ambulatory Visit: Payer: Self-pay

## 2021-11-10 ENCOUNTER — Ambulatory Visit
Admission: RE | Admit: 2021-11-10 | Discharge: 2021-11-10 | Disposition: A | Discharge status: Home / Self Care | Payer: No Typology Code available for payment source | Source: Ambulatory Visit | Attending: Radiation Oncology | Admitting: Radiation Oncology

## 2021-11-10 DIAGNOSIS — Z5111 Encounter for antineoplastic chemotherapy: Secondary | ICD-10-CM | POA: Diagnosis not present

## 2021-11-10 LAB — RAD ONC ARIA SESSION SUMMARY
Course Elapsed Days: 16
Plan Fractions Treated to Date: 13
Plan Prescribed Dose Per Fraction: 2.66 Gy
Plan Total Fractions Prescribed: 16
Plan Total Prescribed Dose: 42.56 Gy
Reference Point Dosage Given to Date: 34.58 Gy
Reference Point Session Dosage Given: 2.66 Gy
Session Number: 13

## 2021-11-11 ENCOUNTER — Other Ambulatory Visit (HOSPITAL_COMMUNITY): Payer: No Typology Code available for payment source

## 2021-11-11 ENCOUNTER — Other Ambulatory Visit: Payer: Self-pay

## 2021-11-11 ENCOUNTER — Ambulatory Visit
Admission: RE | Admit: 2021-11-11 | Discharge: 2021-11-11 | Disposition: A | Discharge status: Home / Self Care | Payer: No Typology Code available for payment source | Source: Ambulatory Visit | Attending: Radiation Oncology | Admitting: Radiation Oncology

## 2021-11-11 DIAGNOSIS — Z5111 Encounter for antineoplastic chemotherapy: Secondary | ICD-10-CM | POA: Diagnosis not present

## 2021-11-11 LAB — RAD ONC ARIA SESSION SUMMARY
Course Elapsed Days: 17
Plan Fractions Treated to Date: 14
Plan Prescribed Dose Per Fraction: 2.66 Gy
Plan Total Fractions Prescribed: 16
Plan Total Prescribed Dose: 42.56 Gy
Reference Point Dosage Given to Date: 37.24 Gy
Reference Point Session Dosage Given: 2.66 Gy
Session Number: 14

## 2021-11-12 ENCOUNTER — Ambulatory Visit
Admission: RE | Admit: 2021-11-12 | Discharge: 2021-11-12 | Disposition: A | Discharge status: Home / Self Care | Payer: No Typology Code available for payment source | Source: Ambulatory Visit | Attending: Radiation Oncology | Admitting: Radiation Oncology

## 2021-11-12 ENCOUNTER — Ambulatory Visit: Payer: No Typology Code available for payment source | Admitting: Radiation Oncology

## 2021-11-12 ENCOUNTER — Other Ambulatory Visit: Payer: Self-pay

## 2021-11-12 ENCOUNTER — Ambulatory Visit (HOSPITAL_COMMUNITY)
Admission: RE | Admit: 2021-11-12 | Discharge: 2021-11-12 | Disposition: A | Discharge status: Home / Self Care | Payer: No Typology Code available for payment source | Source: Ambulatory Visit | Attending: Hematology and Oncology | Admitting: Hematology and Oncology

## 2021-11-12 DIAGNOSIS — Z09 Encounter for follow-up examination after completed treatment for conditions other than malignant neoplasm: Secondary | ICD-10-CM | POA: Diagnosis not present

## 2021-11-12 DIAGNOSIS — Z0189 Encounter for other specified special examinations: Secondary | ICD-10-CM | POA: Diagnosis not present

## 2021-11-12 DIAGNOSIS — C50411 Malignant neoplasm of upper-outer quadrant of right female breast: Secondary | ICD-10-CM | POA: Diagnosis present

## 2021-11-12 DIAGNOSIS — Z170 Estrogen receptor positive status [ER+]: Secondary | ICD-10-CM | POA: Diagnosis not present

## 2021-11-12 LAB — RAD ONC ARIA SESSION SUMMARY
Course Elapsed Days: 18
Plan Fractions Treated to Date: 15
Plan Prescribed Dose Per Fraction: 2.66 Gy
Plan Total Fractions Prescribed: 16
Plan Total Prescribed Dose: 42.56 Gy
Reference Point Dosage Given to Date: 39.9 Gy
Reference Point Session Dosage Given: 2.66 Gy
Session Number: 15

## 2021-11-12 LAB — ECHOCARDIOGRAM COMPLETE
Area-P 1/2: 4.08 cm2
S' Lateral: 2.2 cm

## 2021-11-15 ENCOUNTER — Other Ambulatory Visit: Payer: Self-pay

## 2021-11-15 ENCOUNTER — Ambulatory Visit
Admission: RE | Admit: 2021-11-15 | Discharge: 2021-11-15 | Disposition: A | Discharge status: Home / Self Care | Payer: No Typology Code available for payment source | Source: Ambulatory Visit | Attending: Radiation Oncology | Admitting: Radiation Oncology

## 2021-11-15 DIAGNOSIS — Z5111 Encounter for antineoplastic chemotherapy: Secondary | ICD-10-CM | POA: Diagnosis not present

## 2021-11-15 LAB — RAD ONC ARIA SESSION SUMMARY
Course Elapsed Days: 21
Plan Fractions Treated to Date: 16
Plan Prescribed Dose Per Fraction: 2.66 Gy
Plan Total Fractions Prescribed: 16
Plan Total Prescribed Dose: 42.56 Gy
Reference Point Dosage Given to Date: 42.56 Gy
Reference Point Session Dosage Given: 2.66 Gy
Session Number: 16

## 2021-11-16 ENCOUNTER — Other Ambulatory Visit: Payer: Self-pay

## 2021-11-16 ENCOUNTER — Ambulatory Visit
Admission: RE | Admit: 2021-11-16 | Discharge: 2021-11-16 | Disposition: A | Discharge status: Home / Self Care | Payer: No Typology Code available for payment source | Source: Ambulatory Visit | Attending: Radiation Oncology | Admitting: Radiation Oncology

## 2021-11-16 DIAGNOSIS — Z5111 Encounter for antineoplastic chemotherapy: Secondary | ICD-10-CM | POA: Diagnosis not present

## 2021-11-16 LAB — RAD ONC ARIA SESSION SUMMARY
Course Elapsed Days: 22
Plan Fractions Treated to Date: 1
Plan Prescribed Dose Per Fraction: 2 Gy
Plan Total Fractions Prescribed: 4
Plan Total Prescribed Dose: 8 Gy
Reference Point Dosage Given to Date: 2 Gy
Reference Point Session Dosage Given: 2 Gy
Session Number: 17

## 2021-11-17 ENCOUNTER — Ambulatory Visit
Admission: RE | Admit: 2021-11-17 | Discharge: 2021-11-17 | Disposition: A | Discharge status: Home / Self Care | Payer: No Typology Code available for payment source | Source: Ambulatory Visit | Attending: Radiation Oncology | Admitting: Radiation Oncology

## 2021-11-17 ENCOUNTER — Other Ambulatory Visit: Payer: Self-pay

## 2021-11-17 DIAGNOSIS — Z5111 Encounter for antineoplastic chemotherapy: Secondary | ICD-10-CM | POA: Diagnosis not present

## 2021-11-17 LAB — RAD ONC ARIA SESSION SUMMARY
Course Elapsed Days: 23
Plan Fractions Treated to Date: 2
Plan Prescribed Dose Per Fraction: 2 Gy
Plan Total Fractions Prescribed: 4
Plan Total Prescribed Dose: 8 Gy
Reference Point Dosage Given to Date: 4 Gy
Reference Point Session Dosage Given: 2 Gy
Session Number: 18

## 2021-11-18 ENCOUNTER — Other Ambulatory Visit: Payer: Self-pay

## 2021-11-18 ENCOUNTER — Ambulatory Visit
Admission: RE | Admit: 2021-11-18 | Discharge: 2021-11-18 | Disposition: A | Discharge status: Home / Self Care | Payer: No Typology Code available for payment source | Source: Ambulatory Visit | Attending: Radiation Oncology | Admitting: Radiation Oncology

## 2021-11-18 DIAGNOSIS — Z5111 Encounter for antineoplastic chemotherapy: Secondary | ICD-10-CM | POA: Diagnosis not present

## 2021-11-18 LAB — RAD ONC ARIA SESSION SUMMARY
Course Elapsed Days: 24
Plan Fractions Treated to Date: 3
Plan Prescribed Dose Per Fraction: 2 Gy
Plan Total Fractions Prescribed: 4
Plan Total Prescribed Dose: 8 Gy
Reference Point Dosage Given to Date: 6 Gy
Reference Point Session Dosage Given: 2 Gy
Session Number: 19

## 2021-11-19 ENCOUNTER — Encounter: Payer: Self-pay | Admitting: Radiation Oncology

## 2021-11-19 ENCOUNTER — Other Ambulatory Visit: Payer: Self-pay

## 2021-11-19 ENCOUNTER — Ambulatory Visit
Admission: RE | Admit: 2021-11-19 | Discharge: 2021-11-19 | Disposition: A | Discharge status: Home / Self Care | Payer: No Typology Code available for payment source | Source: Ambulatory Visit | Attending: Radiation Oncology | Admitting: Radiation Oncology

## 2021-11-19 DIAGNOSIS — Z5111 Encounter for antineoplastic chemotherapy: Secondary | ICD-10-CM | POA: Diagnosis not present

## 2021-11-19 LAB — RAD ONC ARIA SESSION SUMMARY
Course Elapsed Days: 25
Plan Fractions Treated to Date: 4
Plan Prescribed Dose Per Fraction: 2 Gy
Plan Total Fractions Prescribed: 4
Plan Total Prescribed Dose: 8 Gy
Reference Point Dosage Given to Date: 8 Gy
Reference Point Session Dosage Given: 2 Gy
Session Number: 20

## 2021-11-23 ENCOUNTER — Encounter: Payer: Self-pay | Admitting: *Deleted

## 2021-11-30 ENCOUNTER — Inpatient Hospital Stay: Payer: No Typology Code available for payment source | Attending: Hematology and Oncology

## 2021-11-30 ENCOUNTER — Inpatient Hospital Stay: Payer: No Typology Code available for payment source

## 2021-11-30 ENCOUNTER — Other Ambulatory Visit: Payer: Self-pay

## 2021-11-30 VITALS — BP 146/97 | HR 86 | Temp 98.5°F | Resp 18 | Wt 149.4 lb

## 2021-11-30 DIAGNOSIS — Z5111 Encounter for antineoplastic chemotherapy: Secondary | ICD-10-CM | POA: Diagnosis present

## 2021-11-30 DIAGNOSIS — Z170 Estrogen receptor positive status [ER+]: Secondary | ICD-10-CM | POA: Diagnosis not present

## 2021-11-30 DIAGNOSIS — Z5112 Encounter for antineoplastic immunotherapy: Secondary | ICD-10-CM | POA: Insufficient documentation

## 2021-11-30 DIAGNOSIS — C50411 Malignant neoplasm of upper-outer quadrant of right female breast: Secondary | ICD-10-CM | POA: Diagnosis not present

## 2021-11-30 DIAGNOSIS — Z95828 Presence of other vascular implants and grafts: Secondary | ICD-10-CM

## 2021-11-30 MED ORDER — TRASTUZUMAB-ANNS CHEMO 150 MG IV SOLR
6.0000 mg/kg | Freq: Once | INTRAVENOUS | Status: AC
  Administered 2021-11-30: 399 mg via INTRAVENOUS
  Filled 2021-11-30: qty 19

## 2021-11-30 MED ORDER — SODIUM CHLORIDE 0.9 % IV SOLN
420.0000 mg | Freq: Once | INTRAVENOUS | Status: AC
  Administered 2021-11-30: 420 mg via INTRAVENOUS
  Filled 2021-11-30: qty 14

## 2021-11-30 MED ORDER — SODIUM CHLORIDE 0.9% FLUSH
10.0000 mL | INTRAVENOUS | Status: DC | PRN
  Administered 2021-11-30: 10 mL

## 2021-11-30 MED ORDER — HEPARIN SOD (PORK) LOCK FLUSH 100 UNIT/ML IV SOLN
500.0000 [IU] | Freq: Once | INTRAVENOUS | Status: AC | PRN
  Administered 2021-11-30: 500 [IU]

## 2021-11-30 MED ORDER — DIPHENHYDRAMINE HCL 25 MG PO CAPS: 50.0000 mg | ORAL_CAPSULE | Freq: Once | ORAL | Status: DC

## 2021-11-30 MED ORDER — SODIUM CHLORIDE 0.9% FLUSH
10.0000 mL | Freq: Once | INTRAVENOUS | Status: AC
  Administered 2021-11-30: 10 mL

## 2021-11-30 MED ORDER — SODIUM CHLORIDE 0.9 % IV SOLN: Freq: Once | INTRAVENOUS | Status: AC

## 2021-11-30 MED ORDER — ACETAMINOPHEN 325 MG PO TABS: 650.0000 mg | ORAL_TABLET | Freq: Once | ORAL | Status: DC

## 2021-11-30 NOTE — Addendum Note (Signed)
Addended by: Tora Kindred on: 11/30/2021 12:56 PM   Modules accepted: Orders

## 2021-11-30 NOTE — Progress Notes (Signed)
Per Dr. Chryl Heck, patient is okay not to take benadryl as a premed to trastuzumab and pertuzumab. Patient takes tylenol at home

## 2021-12-01 ENCOUNTER — Encounter: Payer: Self-pay | Admitting: Adult Health

## 2021-12-01 ENCOUNTER — Encounter: Payer: Self-pay | Admitting: Hematology and Oncology

## 2021-12-01 NOTE — Progress Notes (Signed)
                                                                                                                                                             Patient Name: [name] [name] MRN: [id number] DOB: Jun 05, 1960 Referring Physician: Zack Seal (Profile Not Attached) Date of Service: 11/19/2021 [facility] [place]                                                        End Of Treatment Note  Diagnoses: C50.411-Malignant neoplasm of upper-outer quadrant of right female breast  Cancer Staging:  Stage IB, cT2N0M0, grade 2, triple positive invasive ductal carcinoma of the right breast.   Intent: Curative  Radiation Treatment Dates: 10/25/2021 through 11/19/2021 Site Technique Total Dose (Gy) Dose per Fx (Gy) Completed Fx Beam Energies  Breast, Right: Breast_R 3D 42.56/42.56 2.66 16/16 6X  Breast, Right: Breast_R_Bst 3D 8/8 2 4/4 6X   Narrative: The patient tolerated radiation therapy relatively well. She developed anticipated skin changes in the treatment field.   Plan: The patient will receive a call in about one month from the radiation oncology department. She will continue follow up with Dr. Chryl Heck as well.   ________________________________________________    Carola Rhine, [facility]

## 2021-12-02 ENCOUNTER — Other Ambulatory Visit: Payer: Self-pay | Admitting: *Deleted

## 2021-12-02 ENCOUNTER — Other Ambulatory Visit: Payer: Self-pay | Admitting: Hematology and Oncology

## 2021-12-02 MED ORDER — VEOZAH 45 MG PO TABS: 1.0000 | ORAL_TABLET | Freq: Every day | ORAL | 3 refills | Status: DC

## 2021-12-02 NOTE — Telephone Encounter (Signed)
Per MD- prescription for [facility] sent to pt's pharmacy- this RN noted on script need for use due to ER/PR + Breast cancer with severe hot flashes interfering with ADL's.  This RN called pt and obtained identified VM- message left informing patient of possible need to obtain insurance authorization for lower cost and to let us know if she has further concerns.  This note will be forwarded to nurse who processes medication over rides.

## 2021-12-05 ENCOUNTER — Other Ambulatory Visit: Payer: Self-pay

## 2021-12-06 ENCOUNTER — Ambulatory Visit: Payer: No Typology Code available for payment source | Attending: Radiation Oncology

## 2021-12-06 VITALS — Wt 151.5 lb

## 2021-12-06 DIAGNOSIS — Z483 Aftercare following surgery for neoplasm: Secondary | ICD-10-CM | POA: Insufficient documentation

## 2021-12-06 NOTE — Therapy (Signed)
OUTPATIENT PHYSICAL THERAPY SOZO SCREENING NOTE   Patient Name: [name] [name] MRN: [id number] DOB:09-15-1960, 61 y.o., female Today's Date: 12/06/2021  PCP: [name], MD REFERRING PROVIDER: [name], MD   PT End of Session - 12/06/21 [phone]     Visit Number 8   # unchanged due to screen only   PT Start Time 0943    PT Stop Time 0948    PT Time Calculation (min) 5 min    Activity Tolerance Patient tolerated treatment well    Behavior During Therapy [facility] for tasks assessed/performed             Past Medical History:  Diagnosis Date   Abnormal bleeding in menstrual cycle    Abnormal Pap smear of vagina    Breast cancer ([place])    right breast   COVID 08/2019   Endometrial polyp    Hot flashes    Irregular heart beats    PONV (postoperative nausea and vomiting)    Typical atrial flutter ([place]) 01/01/2019   Past Surgical History:  Procedure Laterality Date   BREAST BIOPSY Right 04/05/2021   BREAST IMPLANT REMOVAL Bilateral 2021   breast implants Bilateral 2002   BREAST LUMPECTOMY WITH RADIOACTIVE SEED AND SENTINEL LYMPH NODE BIOPSY Right 09/08/2021   Procedure: RIGHT BREAST SEED BRACKETED LUMPECTOMY AND SENTINEL LYMPH NODE BIOPSY;  Surgeon: Stark Klein, MD;  Location: [place];  Service: General;  Laterality: Right;  [place] N/A 04/06/2021   Procedure: INSERTION PORT-A-CATH;  Surgeon: Stark Klein, MD;  Location: [place];  Service: General;  Laterality: N/A;   SHOULDER SURGERY Right    Rotator cuff repair   Patient Active Problem List   Diagnosis Date Noted   Dysuria 06/08/2021   Port-A-Cath in place 05/18/2021   Chemotherapy induced diarrhea 04/27/2021   Rash and nonspecific skin eruption 04/27/2021   UTI (urinary tract infection) 04/27/2021   Bone pain due to granulocyte colony stimulating factor 04/27/2021   Transaminitis 04/27/2021   Genetic testing 03/31/2021   Malignant neoplasm of  upper-outer quadrant of right breast in female, estrogen receptor positive ([place]) 03/16/2021   History of cervical dysplasia 02/17/2021   Hot flashes due to menopause 09/16/2019   Typical atrial flutter ([place]) 01/01/2019    REFERRING DIAG: right breast cancer at risk for lymphedema  THERAPY DIAG:  Aftercare following surgery for neoplasm  PERTINENT HISTORY: Patient was diagnosed on 03/04/2021 with right grade II invasive ductal carcinoma breast cancer. It measures 2.2 cm and is located in the upper outer quadrant. It is triple positive with a Ki67 of 25%.  She has a history of breast implants and had a right rotator cuff repair in 2019. Neoadjuvant chemotherapy. Rt lumpectomy and SLNB on 09/08/21 with 5 negative nodes removed.  Drainage noted from incision 09/28/21 with infection. Will be having radiation.   PRECAUTIONS: right UE Lymphedema risk, None  SUBJECTIVE: Pt returns for her first 3 month L-Dex screen.   PAIN:  Are you having pain? No  SOZO SCREENING: Patient was assessed today using the SOZO machine to determine the lymphedema index score. This was compared to her baseline score. It was determined that she is within the recommended range when compared to her baseline and no further action is needed at this time. She will continue SOZO screenings. These are done every 3 months for 2 years post operatively followed by every 6 months for 2 years, and then annually.  L-DEX FLOWSHEETS - 12/06/21 0900       L-DEX LYMPHEDEMA SCREENING   Measurement Type Unilateral    L-DEX MEASUREMENT EXTREMITY Upper Extremity    POSITION  Standing    DOMINANT SIDE Right    At Risk Side Right    BASELINE SCORE (UNILATERAL) -0.9    L-DEX SCORE (UNILATERAL) -1.5    VALUE CHANGE (UNILAT) -0.6               Otelia Limes, PTA 12/06/2021, 9:46 AM

## 2021-12-07 ENCOUNTER — Other Ambulatory Visit: Payer: Self-pay

## 2021-12-10 ENCOUNTER — Telehealth: Payer: Self-pay

## 2021-12-10 NOTE — Telephone Encounter (Signed)
Notified Patient of prior authorization approval for Veozah '45mg'$  tablets. [id number]. Medication is approved through 12/11/2022.

## 2021-12-21 ENCOUNTER — Inpatient Hospital Stay (HOSPITAL_BASED_OUTPATIENT_CLINIC_OR_DEPARTMENT_OTHER): Payer: No Typology Code available for payment source | Admitting: Hematology and Oncology

## 2021-12-21 ENCOUNTER — Inpatient Hospital Stay: Payer: No Typology Code available for payment source

## 2021-12-21 ENCOUNTER — Encounter: Payer: Self-pay | Admitting: Hematology and Oncology

## 2021-12-21 VITALS — BP 151/90 | HR 78 | Temp 97.9°F | Resp 16 | Ht 62.0 in | Wt 149.3 lb

## 2021-12-21 DIAGNOSIS — C50411 Malignant neoplasm of upper-outer quadrant of right female breast: Secondary | ICD-10-CM

## 2021-12-21 DIAGNOSIS — Z170 Estrogen receptor positive status [ER+]: Secondary | ICD-10-CM

## 2021-12-21 DIAGNOSIS — Z5111 Encounter for antineoplastic chemotherapy: Secondary | ICD-10-CM | POA: Diagnosis not present

## 2021-12-21 DIAGNOSIS — Z95828 Presence of other vascular implants and grafts: Secondary | ICD-10-CM

## 2021-12-21 LAB — CMP (CANCER CENTER ONLY)
ALT: 17 U/L (ref 0–44)
AST: 18 U/L (ref 15–41)
Albumin: 4.3 g/dL (ref 3.5–5.0)
Alkaline Phosphatase: 66 U/L (ref 38–126)
Anion gap: 6 (ref 5–15)
BUN: 21 mg/dL (ref 8–23)
CO2: 25 mmol/L (ref 22–32)
Calcium: 9.6 mg/dL (ref 8.9–10.3)
Chloride: 107 mmol/L (ref 98–111)
Creatinine: 0.83 mg/dL (ref 0.44–1.00)
GFR, Estimated: >60 mL/min (ref >60)
Glucose, Bld: 128 mg/dL — ABNORMAL HIGH (ref 70–99)
Potassium: 4.1 mmol/L (ref 3.5–5.1)
Sodium: 138 mmol/L (ref 135–145)
Total Bilirubin: 0.4 mg/dL (ref 0.3–1.2)
Total Protein: 6.7 g/dL (ref 6.5–8.1)

## 2021-12-21 LAB — CBC WITH DIFFERENTIAL (CANCER CENTER ONLY)
Abs Immature Granulocytes: 0 10*3/uL (ref 0.00–0.07)
Basophils Absolute: 0 10*3/uL (ref 0.0–0.1)
Basophils Relative: 1 %
Eosinophils Absolute: 0.1 10*3/uL (ref 0.0–0.5)
Eosinophils Relative: 2 %
HCT: 36.2 % (ref 36.0–46.0)
Hemoglobin: 12.5 g/dL (ref 12.0–15.0)
Immature Granulocytes: 0 %
Lymphocytes Relative: 29 %
Lymphs Abs: 0.8 10*3/uL (ref 0.7–4.0)
MCH: 32.3 pg (ref 26.0–34.0)
MCHC: 34.5 g/dL (ref 30.0–36.0)
MCV: 93.5 fL (ref 80.0–100.0)
Monocytes Absolute: 0.4 10*3/uL (ref 0.1–1.0)
Monocytes Relative: 14 %
Neutro Abs: 1.5 10*3/uL — ABNORMAL LOW (ref 1.7–7.7)
Neutrophils Relative %: 54 %
Platelet Count: 224 10*3/uL (ref 150–400)
RBC: 3.87 MIL/uL (ref 3.87–5.11)
RDW: 13.5 % (ref 11.5–15.5)
WBC Count: 2.8 10*3/uL — ABNORMAL LOW (ref 4.0–10.5)
nRBC: 0 % (ref 0.0–0.2)

## 2021-12-21 MED ORDER — SODIUM CHLORIDE 0.9 % IV SOLN
420.0000 mg | Freq: Once | INTRAVENOUS | Status: AC
  Administered 2021-12-21: 420 mg via INTRAVENOUS
  Filled 2021-12-21: qty 14

## 2021-12-21 MED ORDER — SODIUM CHLORIDE 0.9 % IV SOLN: Freq: Once | INTRAVENOUS | Status: AC

## 2021-12-21 MED ORDER — SODIUM CHLORIDE 0.9% FLUSH
10.0000 mL | Freq: Once | INTRAVENOUS | Status: AC
  Administered 2021-12-21: 10 mL

## 2021-12-21 MED ORDER — TRASTUZUMAB-ANNS CHEMO 150 MG IV SOLR
6.0000 mg/kg | Freq: Once | INTRAVENOUS | Status: AC
  Administered 2021-12-21: 399 mg via INTRAVENOUS
  Filled 2021-12-21: qty 19

## 2021-12-21 MED ORDER — SODIUM CHLORIDE 0.9% FLUSH
10.0000 mL | INTRAVENOUS | Status: DC | PRN
  Administered 2021-12-21: 10 mL

## 2021-12-21 MED ORDER — HEPARIN SOD (PORK) LOCK FLUSH 100 UNIT/ML IV SOLN
500.0000 [IU] | Freq: Once | INTRAVENOUS | Status: AC | PRN
  Administered 2021-12-21: 500 [IU]

## 2021-12-21 NOTE — Progress Notes (Signed)
[facility] Follow up:    [name] [name], [place]  [id number]   DIAGNOSIS:  Cancer Staging  Malignant neoplasm of upper-outer quadrant of right breast in female, estrogen receptor positive ([place]) Staging form: Breast, AJCC 8th Edition - Clinical stage from 03/17/2021: Stage IB (cT2, cN0, cM0, G2, ER+, PR+, HER2+) - Unsigned Stage prefix: Initial diagnosis Method of lymph node assessment: Clinical Histologic grading system: 3 grade system   SUMMARY OF ONCOLOGIC HISTORY: Oncology History  Malignant neoplasm of upper-outer quadrant of right breast in female, estrogen receptor positive ([place])  03/16/2021 Initial Diagnosis   Malignant neoplasm of upper-outer quadrant of right breast in female, estrogen receptor positive ([place])   04/07/2021 - 07/23/2021 Chemotherapy   Patient is on Treatment Plan : BREAST  Docetaxel + Carboplatin + Trastuzumab + Pertuzumab  (TCHP) q21d       Genetic Testing   [facility] CustomNext Panel was Negative. Report date is 03/28/2021.  The CustomNext-Cancer+RNAinsight panel offered by [facility] includes sequencing and rearrangement analysis for the following 47 genes:  APC, ATM, AXIN2, BARD1, BMPR1A, BRCA1, BRCA2, BRIP1, CDH1, CDK4, CDKN2A, CHEK2, CTNNA1, DICER1, EPCAM, GREM1, HOXB13, KIT, MEN1, MLH1, MSH2, MSH3, MSH6, MUTYH, NBN, NF1, NTHL1, PALB2, PDGFRA, PMS2, POLD1, POLE, PTEN, RAD50, RAD51C, RAD51D, SDHA, SDHB, SDHC, SDHD, SMAD4, SMARCA4, STK11, TP53, TSC1, TSC2, and VHL.  RNA data is routinely analyzed for use in variant interpretation for all genes.   08/13/2021 - 09/03/2021 Chemotherapy   Patient is on Treatment Plan : BREAST Trastuzumab  + Pertuzumab q21d x 13 cycles     08/13/2021 -  Chemotherapy   Patient is on Treatment Plan : BREAST Trastuzumab  + Pertuzumab q21d x 13 cycles     09/08/2021 Surgery   Right breast lumpectomy:      CURRENT THERAPY: TCHP, completed 6 cycles, currently on Herceptin and  Perjeta   INTERVAL HISTORY:  [name] [name] is here for follow-up.   She has been doing really well since her last visit.  She has been staying active, working regularly.  She reports some joint stiffness in her hands especially when she wakes up.  This becomes more manageable as the day goes No chest pain, cough, shortness of breath or lower extremity swelling. Rest of the pertinent 10 point ROS reviewed and negative.   Patient Active Problem List   Diagnosis Date Noted   Dysuria 06/08/2021   Port-A-Cath in place 05/18/2021   Chemotherapy induced diarrhea 04/27/2021   Rash and nonspecific skin eruption 04/27/2021   UTI (urinary tract infection) 04/27/2021   Bone pain due to granulocyte colony stimulating factor 04/27/2021   Transaminitis 04/27/2021   Genetic testing 03/31/2021   Malignant neoplasm of upper-outer quadrant of right breast in female, estrogen receptor positive ([place]) 03/16/2021   History of cervical dysplasia 02/17/2021   Hot flashes due to menopause 09/16/2019   Typical atrial flutter ([place]) 01/01/2019    is allergic to bee venom.  MEDICAL HISTORY: Past Medical History:  Diagnosis Date   Abnormal bleeding in menstrual cycle    Abnormal Pap smear of vagina    Breast cancer ([place])    right breast   COVID 08/2019   Endometrial polyp    Hot flashes    Irregular heart beats    PONV (postoperative nausea and vomiting)    Typical atrial flutter ([place]) 01/01/2019    SURGICAL HISTORY: Past Surgical History:  Procedure Laterality Date   BREAST BIOPSY Right 04/05/2021  BREAST IMPLANT REMOVAL Bilateral 2021   breast implants Bilateral 2002   BREAST LUMPECTOMY WITH RADIOACTIVE SEED AND SENTINEL LYMPH NODE BIOPSY Right 09/08/2021   Procedure: RIGHT BREAST SEED BRACKETED LUMPECTOMY AND SENTINEL LYMPH NODE BIOPSY;  Surgeon: Stark Klein, MD;  Location: [place];  Service: General;  Laterality: Right;  [place] N/A  04/06/2021   Procedure: INSERTION PORT-A-CATH;  Surgeon: Stark Klein, MD;  Location: [place];  Service: General;  Laterality: N/A;   SHOULDER SURGERY Right    Rotator cuff repair    SOCIAL HISTORY: Social History   Socioeconomic History   Marital status: Married    Spouse name: Not on file   Number of children: 2   Years of education: Not on file   Highest education level: Not on file  Occupational History   Not on file  Tobacco Use   Smoking status: Never   Smokeless tobacco: Never  Vaping Use   Vaping Use: Never used  Substance and Sexual Activity   Alcohol use: Yes    Alcohol/week: 14.0 standard drinks of alcohol    Types: 14 Standard drinks or equivalent per week    Comment: 2 white claws every night with dinner   Drug use: Not Currently   Sexual activity: Not on file  Other Topics Concern   Not on file  Social History Narrative   Not on file   Social Determinants of Health   Financial Resource Strain: Not on file  Food Insecurity: Not on file  Transportation Needs: Not on file  Physical Activity: Not on file  Stress: Not on file  Social Connections: Not on file  Intimate Partner Violence: Not on file    FAMILY HISTORY: Family History  Problem Relation Age of Onset   Heart attack Mother     Review of Systems  Constitutional:  Positive for fatigue. Negative for appetite change, chills, fever and unexpected weight change.  HENT:   Negative for hearing loss, lump/mass and trouble swallowing.   Eyes:  Negative for eye problems and icterus.  Respiratory:  Negative for chest tightness, cough and shortness of breath.   Cardiovascular:  Negative for chest pain, leg swelling and palpitations.  Gastrointestinal:  Positive for diarrhea. Negative for abdominal distention, abdominal pain, constipation, nausea and vomiting.  Endocrine: Negative for hot flashes.  Genitourinary:  Negative for difficulty urinating.   Musculoskeletal:  Negative for arthralgias.  Skin:   Negative for itching and rash.  Neurological:  Negative for dizziness, extremity weakness, headaches and numbness.  Hematological:  Negative for adenopathy. Does not bruise/bleed easily.  Psychiatric/Behavioral:  Negative for depression. The patient is not nervous/anxious.       PHYSICAL EXAMINATION  ECOG PERFORMANCE STATUS: 1 - Symptomatic but completely ambulatory  Vitals:   12/21/21 0951  BP: (!) 151/90  Pulse: 78  Resp: 16  Temp: 97.9 F (36.6 C)  SpO2: 100%     Physical Exam Constitutional:      General: She is not in acute distress.    Appearance: Normal appearance. She is not toxic-appearing.  HENT:     Head: Normocephalic and atraumatic.  Eyes:     General: No scleral icterus. Cardiovascular:     Rate and Rhythm: Normal rate and regular rhythm.     Heart sounds: Normal heart sounds.  Pulmonary:     Effort: Pulmonary effort is normal.     Breath sounds: Normal breath sounds.  Abdominal:  General: Abdomen is flat. Bowel sounds are normal. There is no distension.     Palpations: Abdomen is soft.     Tenderness: There is no abdominal tenderness.  Musculoskeletal:        General: No swelling.  Lymphadenopathy:     Cervical: No cervical adenopathy.  Skin:    General: Skin is warm and dry.     Findings: No rash.  Neurological:     General: No focal deficit present.     Mental Status: She is alert.  Psychiatric:        Mood and Affect: Mood normal.        Behavior: Behavior normal.     LABORATORY DATA:  CBC    Component Value Date/Time   WBC 2.8 (L) 12/21/2021 0932   WBC 3.7 (L) 09/01/2021 1106   RBC 3.87 12/21/2021 0932   HGB 12.5 12/21/2021 0932   HGB 12.9 12/03/2018 1624   HCT 36.2 12/21/2021 0932   HCT 38.5 12/03/2018 1624   PLT 224 12/21/2021 0932   PLT 255 12/03/2018 1624   MCV 93.5 12/21/2021 0932   MCV 92 12/03/2018 1624   MCH 32.3 12/21/2021 0932   MCHC 34.5 12/21/2021 0932   RDW 13.5 12/21/2021 0932   RDW 12.3 12/03/2018 1624    LYMPHSABS 0.8 12/21/2021 0932   MONOABS 0.4 12/21/2021 0932   EOSABS 0.1 12/21/2021 0932   BASOSABS 0.0 12/21/2021 0932    CMP     Component Value Date/Time   NA 138 12/21/2021 0932   NA 143 12/03/2018 1624   K 4.1 12/21/2021 0932   CL 107 12/21/2021 0932   CO2 25 12/21/2021 0932   GLUCOSE 128 (H) 12/21/2021 0932   BUN 21 12/21/2021 0932   BUN 17 12/03/2018 1624   CREATININE 0.83 12/21/2021 0932   CALCIUM 9.6 12/21/2021 0932   PROT 6.7 12/21/2021 0932   ALBUMIN 4.3 12/21/2021 0932   AST 18 12/21/2021 0932   ALT 17 12/21/2021 0932   ALKPHOS 66 12/21/2021 0932   BILITOT 0.4 12/21/2021 0932   GFRNONAA >60 12/21/2021 0932   GFRAA 97 12/03/2018 1624    ASSESSMENT and THERAPY PLAN:   # Stage Ib Triple Positive Right Breast Cancer:  -Currently receiving neoadjuvant chemotherapy with taxotere, carboplatin, herceptin and perjeta.  -She completed 6 cycles of neoadjuvant TCHP -MRI done on 07/08/2021 with significant positive response to neoadjuvant chemotherapy, no evidence of metastatic lymphadenopathy.   She is status post right partial mastectomy on August 16.  Pathology showed complete pathologic response.  We have today discussed about continuing Herceptin and pertuzumab for the rest of the year to complete total 1 year of maintenance. Most recent ECHO with EF of 60-65%. No change. Repeat in January. She will continue on Herceptin and pertuzumab maintenance as recommended. She apparently has not started her letrozole.  She was confused because she went to pick up prescriptions from her pharmacy but they did not have anything to pick up.  We have placed on this on October 16 when she last came to see [place].  Will send her a new refill.  She any understands that she has to stay on this medication for 5 years.  With regards to [facility], she says it is too expensive and she does not want to buy this. No concerns on physical examination today.  At this time plan is to continue Herceptin and  Perjeta as scheduled and initiate letrozole as soon as possible.   Return to clinic in  6 weeks with me  Total time spent: 30 minutes including history, physical exam, review of records, counseling and coordination of care  Benay Pike MD

## 2021-12-21 NOTE — Patient Instructions (Signed)
[facility] ONCOLOGY  Discharge Instructions: Thank you for choosing [facility] to provide your oncology and hematology care.   If you have a lab appointment with the [facility], please go directly to the [facility] and check in at the registration area.   Wear comfortable clothing and clothing appropriate for easy access to any Portacath or PICC line.   We strive to give you quality time with your provider. You may need to reschedule your appointment if you arrive late (15 or more minutes).  Arriving late affects you and other patients whose appointments are after yours.  Also, if you miss three or more appointments without notifying the office, you may be dismissed from the clinic at the provider's discretion.      For prescription refill requests, have your pharmacy contact our office and allow 72 hours for refills to be completed.    Today you received the following chemotherapy and/or immunotherapy agents: trastuzumab, pertuzumab      To help prevent nausea and vomiting after your treatment, we encourage you to take your nausea medication as directed.  BELOW ARE SYMPTOMS THAT SHOULD BE REPORTED IMMEDIATELY: *FEVER GREATER THAN 100.4 F (38 C) OR HIGHER *CHILLS OR SWEATING *NAUSEA AND VOMITING THAT IS NOT CONTROLLED WITH YOUR NAUSEA MEDICATION *UNUSUAL SHORTNESS OF BREATH *UNUSUAL BRUISING OR BLEEDING *URINARY PROBLEMS (pain or burning when urinating, or frequent urination) *BOWEL PROBLEMS (unusual diarrhea, constipation, pain near the anus) TENDERNESS IN MOUTH AND THROAT WITH OR WITHOUT PRESENCE OF ULCERS (sore throat, sores in mouth, or a toothache) UNUSUAL RASH, SWELLING OR PAIN  UNUSUAL VAGINAL DISCHARGE OR ITCHING   Items with * indicate a potential emergency and should be followed up as soon as possible or go to the Emergency Department if any problems should occur.  Please show the CHEMOTHERAPY ALERT CARD or IMMUNOTHERAPY ALERT CARD  at check-in to the Emergency Department and triage nurse.  Should you have questions after your visit or need to cancel or reschedule your appointment, please contact [facility]  Dept: [phone]  and follow the prompts.  Office hours are 8:00 a.m. to 4:30 p.m. Monday - Friday. Please note that voicemails left after 4:00 p.m. may not be returned until the following business day.  We are closed weekends and major holidays. You have access to a nurse at all times for urgent questions. Please call the main number to the clinic Dept: [phone] and follow the prompts.   For any non-urgent questions, you may also contact your provider using MyChart. We now offer e-Visits for anyone 40 and older to request care online for non-urgent symptoms. For details visit [email or web address].   Also download the MyChart app! Go to the app store, search "MyChart", open the app, select [facility], and log in with your MyChart username and password.  Masks are optional in the cancer centers. If you would like for your care team to wear a mask while they are taking care of you, please let them know. You may have one support Brittania Sudbeck who is at least 63 years old accompany you for your appointments.

## 2021-12-23 ENCOUNTER — Other Ambulatory Visit: Payer: Self-pay

## 2021-12-23 ENCOUNTER — Other Ambulatory Visit: Payer: No Typology Code available for payment source

## 2021-12-27 ENCOUNTER — Other Ambulatory Visit: Payer: Self-pay

## 2022-01-03 ENCOUNTER — Ambulatory Visit
Admission: RE | Admit: 2022-01-03 | Discharge: 2022-01-03 | Disposition: A | Discharge status: Home / Self Care | Payer: No Typology Code available for payment source | Source: Ambulatory Visit | Attending: Radiation Oncology | Admitting: Radiation Oncology

## 2022-01-03 DIAGNOSIS — Z170 Estrogen receptor positive status [ER+]: Secondary | ICD-10-CM | POA: Insufficient documentation

## 2022-01-03 DIAGNOSIS — C50411 Malignant neoplasm of upper-outer quadrant of right female breast: Secondary | ICD-10-CM | POA: Insufficient documentation

## 2022-01-03 NOTE — Progress Notes (Signed)
  Radiation Oncology         [phone] ________________________________  Name: [name] [name] MRN: [id number]  Date of Service: 01/03/2022  DOB: 1960-07-15  Post Treatment Telephone Note  Diagnosis:  Stage IB, cT2N0M0, grade 2, triple positive invasive ductal carcinoma of the right breast.   Intent: Curative  Radiation Treatment Dates: 10/25/2021 through 11/19/2021 Site Technique Total Dose (Gy) Dose per Fx (Gy) Completed Fx Beam Energies  Breast, Right: Breast_R 3D 42.56/42.56 2.66 16/16 6X  Breast, Right: Breast_R_Bst 3D 8/8 2 4/4 6X  (as documented in provider EOT note)   The patient was not available for call today. A voicemail was left.  The patient was encouraged to avoid sun exposure in the area of prior treatment for up to one year following radiation with either sunscreen or by the style of clothing worn in the sun.  The patient has scheduled follow up with her medical oncologist Dr. Chryl Heck for ongoing surveillance, and was encouraged to call if she develops concerns or questions regarding radiation.    Leandra Kern, LPN

## 2022-01-10 ENCOUNTER — Encounter: Payer: Self-pay | Admitting: *Deleted

## 2022-01-11 ENCOUNTER — Inpatient Hospital Stay: Payer: No Typology Code available for payment source

## 2022-01-11 ENCOUNTER — Other Ambulatory Visit: Payer: Self-pay

## 2022-01-11 ENCOUNTER — Inpatient Hospital Stay: Payer: No Typology Code available for payment source | Attending: Hematology and Oncology

## 2022-01-11 VITALS — BP 157/90 | HR 73 | Temp 98.4°F | Resp 17 | Wt 150.5 lb

## 2022-01-11 DIAGNOSIS — C50411 Malignant neoplasm of upper-outer quadrant of right female breast: Secondary | ICD-10-CM | POA: Diagnosis not present

## 2022-01-11 DIAGNOSIS — Z5111 Encounter for antineoplastic chemotherapy: Secondary | ICD-10-CM | POA: Diagnosis present

## 2022-01-11 DIAGNOSIS — Z170 Estrogen receptor positive status [ER+]: Secondary | ICD-10-CM | POA: Diagnosis not present

## 2022-01-11 DIAGNOSIS — Z95828 Presence of other vascular implants and grafts: Secondary | ICD-10-CM

## 2022-01-11 DIAGNOSIS — Z5112 Encounter for antineoplastic immunotherapy: Secondary | ICD-10-CM | POA: Insufficient documentation

## 2022-01-11 LAB — CMP (CANCER CENTER ONLY)
ALT: 20 U/L (ref 0–44)
AST: 21 U/L (ref 15–41)
Albumin: 4.1 g/dL (ref 3.5–5.0)
Alkaline Phosphatase: 71 U/L (ref 38–126)
Anion gap: 8 (ref 5–15)
BUN: 22 mg/dL (ref 8–23)
CO2: 25 mmol/L (ref 22–32)
Calcium: 9.3 mg/dL (ref 8.9–10.3)
Chloride: 107 mmol/L (ref 98–111)
Creatinine: 0.81 mg/dL (ref 0.44–1.00)
GFR, Estimated: >60 mL/min (ref >60)
Glucose, Bld: 115 mg/dL — ABNORMAL HIGH (ref 70–99)
Potassium: 3.9 mmol/L (ref 3.5–5.1)
Sodium: 140 mmol/L (ref 135–145)
Total Bilirubin: 0.5 mg/dL (ref 0.3–1.2)
Total Protein: 6.9 g/dL (ref 6.5–8.1)

## 2022-01-11 LAB — CBC WITH DIFFERENTIAL/PLATELET
Abs Immature Granulocytes: 0 10*3/uL (ref 0.00–0.07)
Basophils Absolute: 0 10*3/uL (ref 0.0–0.1)
Basophils Relative: 1 %
Eosinophils Absolute: 0.1 10*3/uL (ref 0.0–0.5)
Eosinophils Relative: 2 %
HCT: 36.4 % (ref 36.0–46.0)
Hemoglobin: 12.5 g/dL (ref 12.0–15.0)
Immature Granulocytes: 0 %
Lymphocytes Relative: 27 %
Lymphs Abs: 0.9 10*3/uL (ref 0.7–4.0)
MCH: 31.8 pg (ref 26.0–34.0)
MCHC: 34.3 g/dL (ref 30.0–36.0)
MCV: 92.6 fL (ref 80.0–100.0)
Monocytes Absolute: 0.4 10*3/uL (ref 0.1–1.0)
Monocytes Relative: 12 %
Neutro Abs: 2.1 10*3/uL (ref 1.7–7.7)
Neutrophils Relative %: 58 %
Platelets: 206 10*3/uL (ref 150–400)
RBC: 3.93 MIL/uL (ref 3.87–5.11)
RDW: 13 % (ref 11.5–15.5)
WBC: 3.6 10*3/uL — ABNORMAL LOW (ref 4.0–10.5)
nRBC: 0 % (ref 0.0–0.2)

## 2022-01-11 MED ORDER — TRASTUZUMAB-ANNS CHEMO 150 MG IV SOLR
6.0000 mg/kg | Freq: Once | INTRAVENOUS | Status: AC
  Administered 2022-01-11: 399 mg via INTRAVENOUS
  Filled 2022-01-11: qty 19

## 2022-01-11 MED ORDER — SODIUM CHLORIDE 0.9 % IV SOLN: Freq: Once | INTRAVENOUS | Status: AC

## 2022-01-11 MED ORDER — HEPARIN SOD (PORK) LOCK FLUSH 100 UNIT/ML IV SOLN
500.0000 [IU] | Freq: Once | INTRAVENOUS | Status: AC | PRN
  Administered 2022-01-11: 500 [IU]

## 2022-01-11 MED ORDER — SODIUM CHLORIDE 0.9 % IV SOLN
420.0000 mg | Freq: Once | INTRAVENOUS | Status: AC
  Administered 2022-01-11: 420 mg via INTRAVENOUS
  Filled 2022-01-11: qty 14

## 2022-01-11 MED ORDER — SODIUM CHLORIDE 0.9% FLUSH
10.0000 mL | INTRAVENOUS | Status: DC | PRN
  Administered 2022-01-11: 10 mL

## 2022-01-11 MED ORDER — SODIUM CHLORIDE 0.9% FLUSH
10.0000 mL | Freq: Once | INTRAVENOUS | Status: AC
  Administered 2022-01-11: 10 mL

## 2022-01-11 NOTE — Patient Instructions (Signed)
[facility] ONCOLOGY  Discharge Instructions: Thank you for choosing [facility] to provide your oncology and hematology care.   If you have a lab appointment with the [facility], please go directly to the [facility] and check in at the registration area.   Wear comfortable clothing and clothing appropriate for easy access to any Portacath or PICC line.   We strive to give you quality time with your provider. You may need to reschedule your appointment if you arrive late (15 or more minutes).  Arriving late affects you and other patients whose appointments are after yours.  Also, if you miss three or more appointments without notifying the office, you may be dismissed from the clinic at the provider's discretion.      For prescription refill requests, have your pharmacy contact our office and allow 72 hours for refills to be completed.    Today you received the following chemotherapy and/or immunotherapy agents: trastuzumab, pertuzumab      To help prevent nausea and vomiting after your treatment, we encourage you to take your nausea medication as directed.  BELOW ARE SYMPTOMS THAT SHOULD BE REPORTED IMMEDIATELY: *FEVER GREATER THAN 100.4 F (38 C) OR HIGHER *CHILLS OR SWEATING *NAUSEA AND VOMITING THAT IS NOT CONTROLLED WITH YOUR NAUSEA MEDICATION *UNUSUAL SHORTNESS OF BREATH *UNUSUAL BRUISING OR BLEEDING *URINARY PROBLEMS (pain or burning when urinating, or frequent urination) *BOWEL PROBLEMS (unusual diarrhea, constipation, pain near the anus) TENDERNESS IN MOUTH AND THROAT WITH OR WITHOUT PRESENCE OF ULCERS (sore throat, sores in mouth, or a toothache) UNUSUAL RASH, SWELLING OR PAIN  UNUSUAL VAGINAL DISCHARGE OR ITCHING   Items with * indicate a potential emergency and should be followed up as soon as possible or go to the Emergency Department if any problems should occur.  Please show the CHEMOTHERAPY ALERT CARD or IMMUNOTHERAPY ALERT CARD  at check-in to the Emergency Department and triage nurse.  Should you have questions after your visit or need to cancel or reschedule your appointment, please contact [facility]  Dept: [phone]  and follow the prompts.  Office hours are 8:00 a.m. to 4:30 p.m. Monday - Friday. Please note that voicemails left after 4:00 p.m. may not be returned until the following business day.  We are closed weekends and major holidays. You have access to a nurse at all times for urgent questions. Please call the main number to the clinic Dept: [phone] and follow the prompts.   For any non-urgent questions, you may also contact your provider using MyChart. We now offer e-Visits for anyone 29 and older to request care online for non-urgent symptoms. For details visit [email or web address].   Also download the MyChart app! Go to the app store, search "MyChart", open the app, select [facility], and log in with your MyChart username and password.  Masks are optional in the cancer centers. If you would like for your care team to wear a mask while they are taking care of you, please let them know. You may have one support Damien Cisar who is at least 63 years old accompany you for your appointments.

## 2022-01-21 ENCOUNTER — Other Ambulatory Visit: Payer: Self-pay | Admitting: General Surgery

## 2022-01-21 DIAGNOSIS — Z170 Estrogen receptor positive status [ER+]: Secondary | ICD-10-CM

## 2022-01-25 ENCOUNTER — Other Ambulatory Visit: Payer: Self-pay | Admitting: General Surgery

## 2022-01-25 DIAGNOSIS — Z170 Estrogen receptor positive status [ER+]: Secondary | ICD-10-CM

## 2022-01-28 ENCOUNTER — Encounter: Payer: Self-pay | Admitting: Hematology and Oncology

## 2022-01-28 ENCOUNTER — Ambulatory Visit
Admission: RE | Admit: 2022-01-28 | Discharge: 2022-01-28 | Disposition: A | Discharge status: Home / Self Care | Payer: 59 | Source: Ambulatory Visit | Attending: General Surgery | Admitting: General Surgery

## 2022-01-28 ENCOUNTER — Encounter: Payer: Self-pay | Admitting: Adult Health

## 2022-01-28 ENCOUNTER — Ambulatory Visit: Payer: No Typology Code available for payment source

## 2022-01-28 ENCOUNTER — Ambulatory Visit
Admission: RE | Admit: 2022-01-28 | Discharge: 2022-01-28 | Disposition: A | Discharge status: Home / Self Care | Payer: No Typology Code available for payment source | Source: Ambulatory Visit | Attending: General Surgery | Admitting: General Surgery

## 2022-01-28 DIAGNOSIS — Z170 Estrogen receptor positive status [ER+]: Secondary | ICD-10-CM

## 2022-01-31 ENCOUNTER — Encounter: Payer: Self-pay | Admitting: Hematology and Oncology

## 2022-01-31 ENCOUNTER — Encounter: Payer: Self-pay | Admitting: Adult Health

## 2022-01-31 ENCOUNTER — Other Ambulatory Visit: Payer: Self-pay | Admitting: *Deleted

## 2022-01-31 DIAGNOSIS — Z170 Estrogen receptor positive status [ER+]: Secondary | ICD-10-CM

## 2022-01-31 NOTE — Progress Notes (Unsigned)
[facility] Follow up:    [name] [name], [name] [name] [id number]   DIAGNOSIS:  Cancer Staging  Malignant neoplasm of upper-outer quadrant of right breast in female, estrogen receptor positive ([place]) Staging form: Breast, AJCC 8th Edition - Clinical stage from 03/17/2021: Stage IB (cT2, cN0, cM0, G2, ER+, PR+, HER2+) - Unsigned Stage prefix: Initial diagnosis Method of lymph node assessment: Clinical Histologic grading system: 3 grade system   SUMMARY OF ONCOLOGIC HISTORY: Oncology History  Malignant neoplasm of upper-outer quadrant of right breast in female, estrogen receptor positive ([place])  03/16/2021 Initial Diagnosis   Malignant neoplasm of upper-outer quadrant of right breast in female, estrogen receptor positive ([place])   04/07/2021 - 07/23/2021 Chemotherapy   Patient is on Treatment Plan : BREAST  Docetaxel + Carboplatin + Trastuzumab + Pertuzumab  (TCHP) q21d       Genetic Testing   [facility] CustomNext Panel was Negative. Report date is 03/28/2021.  The CustomNext-Cancer+RNAinsight panel offered by [facility] includes sequencing and rearrangement analysis for the following 47 genes:  APC, ATM, AXIN2, BARD1, BMPR1A, BRCA1, BRCA2, BRIP1, CDH1, CDK4, CDKN2A, CHEK2, CTNNA1, DICER1, EPCAM, GREM1, HOXB13, KIT, MEN1, MLH1, MSH2, MSH3, MSH6, MUTYH, NBN, NF1, NTHL1, PALB2, PDGFRA, PMS2, POLD1, POLE, PTEN, RAD50, RAD51C, RAD51D, SDHA, SDHB, SDHC, SDHD, SMAD4, SMARCA4, STK11, TP53, TSC1, TSC2, and VHL.  RNA data is routinely analyzed for use in variant interpretation for all genes.   08/13/2021 - 09/03/2021 Chemotherapy   Patient is on Treatment Plan : BREAST Trastuzumab  + Pertuzumab q21d x 13 cycles     08/13/2021 -  Chemotherapy   Patient is on Treatment Plan : BREAST Trastuzumab  + Pertuzumab q21d x 13 cycles     09/08/2021 Surgery   Right breast lumpectomy:      CURRENT THERAPY: TCHP, completed 6 cycles, currently on Herceptin and  Perjeta   INTERVAL HISTORY:  [name] [name] is here for follow-up.    Rest of the pertinent 10 point ROS reviewed and negative.   Patient Active Problem List   Diagnosis Date Noted   Dysuria 06/08/2021   Port-A-Cath in place 05/18/2021   Chemotherapy induced diarrhea 04/27/2021   Rash and nonspecific skin eruption 04/27/2021   UTI (urinary tract infection) 04/27/2021   Bone pain due to granulocyte colony stimulating factor 04/27/2021   Transaminitis 04/27/2021   Genetic testing 03/31/2021   Malignant neoplasm of upper-outer quadrant of right breast in female, estrogen receptor positive ([place]) 03/16/2021   History of cervical dysplasia 02/17/2021   Hot flashes due to menopause 09/16/2019   Typical atrial flutter ([place]) 01/01/2019    is allergic to bee venom.  MEDICAL HISTORY: Past Medical History:  Diagnosis Date   Abnormal bleeding in menstrual cycle    Abnormal Pap smear of vagina    Breast cancer (HCC)    right breast   COVID 08/2019   Endometrial polyp    Hot flashes    Irregular heart beats    PONV (postoperative nausea and vomiting)    Typical atrial flutter ([place]) 01/01/2019    SURGICAL HISTORY: Past Surgical History:  Procedure Laterality Date   BREAST BIOPSY Right 04/05/2021   BREAST IMPLANT REMOVAL Bilateral 2021   breast implants Bilateral 2002   BREAST LUMPECTOMY WITH RADIOACTIVE SEED AND SENTINEL LYMPH NODE BIOPSY Right 09/08/2021   Procedure: RIGHT BREAST SEED BRACKETED LUMPECTOMY AND SENTINEL LYMPH NODE BIOPSY;  Surgeon: Stark Klein, MD;  Location: [facility];  Service: General;  Laterality: Right;  GEN & [place] N/A 04/06/2021   Procedure: INSERTION PORT-A-CATH;  Surgeon: Stark Klein, MD;  Location: [facility];  Service: General;  Laterality: N/A;   SHOULDER SURGERY Right    Rotator cuff repair    SOCIAL HISTORY: Social History   Socioeconomic History   Marital status: Married    Spouse name: Not on file    Number of children: 2   Years of education: Not on file   Highest education level: Not on file  Occupational History   Not on file  Tobacco Use   Smoking status: Never   Smokeless tobacco: Never  Vaping Use   Vaping Use: Never used  Substance and Sexual Activity   Alcohol use: Yes    Alcohol/week: 14.0 standard drinks of alcohol    Types: 14 Standard drinks or equivalent per week    Comment: 2 white claws every night with dinner   Drug use: Not Currently   Sexual activity: Not on file  Other Topics Concern   Not on file  Social History Narrative   Not on file   Social Determinants of Health   Financial Resource Strain: Not on file  Food Insecurity: Not on file  Transportation Needs: Not on file  Physical Activity: Not on file  Stress: Not on file  Social Connections: Not on file  Intimate Partner Violence: Not on file    FAMILY HISTORY: Family History  Problem Relation Age of Onset   Heart attack Mother     Review of Systems  Constitutional:  Positive for fatigue. Negative for appetite change, chills, fever and unexpected weight change.  HENT:   Negative for hearing loss, lump/mass and trouble swallowing.   Eyes:  Negative for eye problems and icterus.  Respiratory:  Negative for chest tightness, cough and shortness of breath.   Cardiovascular:  Negative for chest pain, leg swelling and palpitations.  Gastrointestinal:  Positive for diarrhea. Negative for abdominal distention, abdominal pain, constipation, nausea and vomiting.  Endocrine: Negative for hot flashes.  Genitourinary:  Negative for difficulty urinating.   Musculoskeletal:  Negative for arthralgias.  Skin:  Negative for itching and rash.  Neurological:  Negative for dizziness, extremity weakness, headaches and numbness.  Hematological:  Negative for adenopathy. Does not bruise/bleed easily.  Psychiatric/Behavioral:  Negative for depression. The patient is not nervous/anxious.       PHYSICAL  EXAMINATION  ECOG PERFORMANCE STATUS: 1 - Symptomatic but completely ambulatory  There were no vitals filed for this visit.    Physical Exam Constitutional:      General: She is not in acute distress.    Appearance: Normal appearance. She is not toxic-appearing.  HENT:     Head: Normocephalic and atraumatic.  Eyes:     General: No scleral icterus. Cardiovascular:     Rate and Rhythm: Normal rate and regular rhythm.     Heart sounds: Normal heart sounds.  Pulmonary:     Effort: Pulmonary effort is normal.     Breath sounds: Normal breath sounds.  Abdominal:     General: Abdomen is flat. Bowel sounds are normal. There is no distension.     Palpations: Abdomen is soft.     Tenderness: There is no abdominal tenderness.  Musculoskeletal:        General: No swelling.  Lymphadenopathy:     Cervical: No cervical adenopathy.  Skin:    General: Skin is warm and dry.  Findings: No rash.  Neurological:     General: No focal deficit present.     Mental Status: She is alert.  Psychiatric:        Mood and Affect: Mood normal.        Behavior: Behavior normal.     LABORATORY DATA:  CBC    Component Value Date/Time   WBC 3.6 (L) 01/11/2022 0905   RBC 3.93 01/11/2022 0905   HGB 12.5 01/11/2022 0905   HGB 12.5 12/21/2021 0932   HGB 12.9 12/03/2018 1624   HCT 36.4 01/11/2022 0905   HCT 38.5 12/03/2018 1624   PLT 206 01/11/2022 0905   PLT 224 12/21/2021 0932   PLT 255 12/03/2018 1624   MCV 92.6 01/11/2022 0905   MCV 92 12/03/2018 1624   MCH 31.8 01/11/2022 0905   MCHC 34.3 01/11/2022 0905   RDW 13.0 01/11/2022 0905   RDW 12.3 12/03/2018 1624   LYMPHSABS 0.9 01/11/2022 0905   MONOABS 0.4 01/11/2022 0905   EOSABS 0.1 01/11/2022 0905   BASOSABS 0.0 01/11/2022 0905    CMP     Component Value Date/Time   NA 140 01/11/2022 0905   NA 143 12/03/2018 1624   K 3.9 01/11/2022 0905   CL 107 01/11/2022 0905   CO2 25 01/11/2022 0905   GLUCOSE 115 (H) 01/11/2022 0905    BUN 22 01/11/2022 0905   BUN 17 12/03/2018 1624   CREATININE 0.81 01/11/2022 0905   CALCIUM 9.3 01/11/2022 0905   PROT 6.9 01/11/2022 0905   ALBUMIN 4.1 01/11/2022 0905   AST 21 01/11/2022 0905   ALT 20 01/11/2022 0905   ALKPHOS 71 01/11/2022 0905   BILITOT 0.5 01/11/2022 0905   GFRNONAA >60 01/11/2022 0905   GFRAA 97 12/03/2018 1624    ASSESSMENT and THERAPY PLAN:   # Stage Ib Triple Positive Right Breast Cancer:  -Currently receiving neoadjuvant chemotherapy with taxotere, carboplatin, herceptin and perjeta.  -She completed 6 cycles of neoadjuvant TCHP -MRI done on 07/08/2021 with significant positive response to neoadjuvant chemotherapy, no evidence of metastatic lymphadenopathy.   She is status post right partial mastectomy on August 16.  Pathology showed complete pathologic response.  We have today discussed about continuing Herceptin and pertuzumab for the rest of the year to complete total 1 year of maintenance. Most recent ECHO with EF of 60-65%. No change. Repeat in January. She will continue on Herceptin and pertuzumab maintenance as recommended. She apparently has not started her letrozole.  She was confused because she went to pick up prescriptions from her pharmacy but they did not have anything to pick up.  We have placed on this on October 16 when she last came to see [place].  Will send her a new refill.  She any understands that she has to stay on this medication for 5 years.  With regards to [facility], she says it is too expensive and she does not want to buy this. No concerns on physical examination today.  At this time plan is to continue Herceptin and Perjeta as scheduled and initiate letrozole as soon as possible.   Return to clinic in 6 weeks with me  Total time spent: 30 minutes including history, physical exam, review of records, counseling and coordination of care  Benay Pike MD

## 2022-02-01 ENCOUNTER — Inpatient Hospital Stay: Payer: 59

## 2022-02-01 ENCOUNTER — Inpatient Hospital Stay: Payer: 59 | Attending: Hematology and Oncology

## 2022-02-01 ENCOUNTER — Encounter: Payer: Self-pay | Admitting: Adult Health

## 2022-02-01 ENCOUNTER — Inpatient Hospital Stay (HOSPITAL_BASED_OUTPATIENT_CLINIC_OR_DEPARTMENT_OTHER): Payer: 59 | Admitting: Adult Health

## 2022-02-01 VITALS — BP 148/83 | HR 76 | Resp 16

## 2022-02-01 DIAGNOSIS — C50411 Malignant neoplasm of upper-outer quadrant of right female breast: Secondary | ICD-10-CM | POA: Diagnosis not present

## 2022-02-01 DIAGNOSIS — Z79811 Long term (current) use of aromatase inhibitors: Secondary | ICD-10-CM | POA: Diagnosis not present

## 2022-02-01 DIAGNOSIS — Z170 Estrogen receptor positive status [ER+]: Secondary | ICD-10-CM

## 2022-02-01 DIAGNOSIS — Z5112 Encounter for antineoplastic immunotherapy: Secondary | ICD-10-CM | POA: Diagnosis present

## 2022-02-01 DIAGNOSIS — Z5111 Encounter for antineoplastic chemotherapy: Secondary | ICD-10-CM | POA: Insufficient documentation

## 2022-02-01 DIAGNOSIS — Z95828 Presence of other vascular implants and grafts: Secondary | ICD-10-CM

## 2022-02-01 LAB — CBC WITH DIFFERENTIAL (CANCER CENTER ONLY)
Abs Immature Granulocytes: 0 10*3/uL (ref 0.00–0.07)
Basophils Absolute: 0 10*3/uL (ref 0.0–0.1)
Basophils Relative: 1 %
Eosinophils Absolute: 0.1 10*3/uL (ref 0.0–0.5)
Eosinophils Relative: 3 %
HCT: 37.3 % (ref 36.0–46.0)
Hemoglobin: 12.8 g/dL (ref 12.0–15.0)
Immature Granulocytes: 0 %
Lymphocytes Relative: 26 %
Lymphs Abs: 1.1 10*3/uL (ref 0.7–4.0)
MCH: 32 pg (ref 26.0–34.0)
MCHC: 34.3 g/dL (ref 30.0–36.0)
MCV: 93.3 fL (ref 80.0–100.0)
Monocytes Absolute: 0.5 10*3/uL (ref 0.1–1.0)
Monocytes Relative: 13 %
Neutro Abs: 2.3 10*3/uL (ref 1.7–7.7)
Neutrophils Relative %: 57 %
Platelet Count: 240 10*3/uL (ref 150–400)
RBC: 4 MIL/uL (ref 3.87–5.11)
RDW: 12.7 % (ref 11.5–15.5)
WBC Count: 4.1 10*3/uL (ref 4.0–10.5)
nRBC: 0 % (ref 0.0–0.2)

## 2022-02-01 LAB — CMP (CANCER CENTER ONLY)
ALT: 20 U/L (ref 0–44)
AST: 18 U/L (ref 15–41)
Albumin: 4.1 g/dL (ref 3.5–5.0)
Alkaline Phosphatase: 76 U/L (ref 38–126)
Anion gap: 6 (ref 5–15)
BUN: 20 mg/dL (ref 8–23)
CO2: 27 mmol/L (ref 22–32)
Calcium: 9.5 mg/dL (ref 8.9–10.3)
Chloride: 107 mmol/L (ref 98–111)
Creatinine: 0.8 mg/dL (ref 0.44–1.00)
GFR, Estimated: >60 mL/min (ref >60)
Glucose, Bld: 96 mg/dL (ref 70–99)
Potassium: 4 mmol/L (ref 3.5–5.1)
Sodium: 140 mmol/L (ref 135–145)
Total Bilirubin: 0.4 mg/dL (ref 0.3–1.2)
Total Protein: 6.6 g/dL (ref 6.5–8.1)

## 2022-02-01 MED ORDER — SODIUM CHLORIDE 0.9 % IV SOLN
420.0000 mg | Freq: Once | INTRAVENOUS | Status: AC
  Administered 2022-02-01: 420 mg via INTRAVENOUS
  Filled 2022-02-01: qty 14

## 2022-02-01 MED ORDER — HEPARIN SOD (PORK) LOCK FLUSH 100 UNIT/ML IV SOLN
500.0000 [IU] | Freq: Once | INTRAVENOUS | Status: AC | PRN
  Administered 2022-02-01: 500 [IU]

## 2022-02-01 MED ORDER — SODIUM CHLORIDE 0.9% FLUSH
10.0000 mL | INTRAVENOUS | Status: DC | PRN
  Administered 2022-02-01: 10 mL

## 2022-02-01 MED ORDER — SODIUM CHLORIDE 0.9 % IV SOLN: Freq: Once | INTRAVENOUS | Status: AC

## 2022-02-01 MED ORDER — TRASTUZUMAB-ANNS CHEMO 420 MG IV SOLR
6.0000 mg/kg | Freq: Once | INTRAVENOUS | Status: AC
  Administered 2022-02-01: 420 mg via INTRAVENOUS
  Filled 2022-02-01: qty 20

## 2022-02-01 MED ORDER — SODIUM CHLORIDE 0.9% FLUSH
10.0000 mL | Freq: Once | INTRAVENOUS | Status: AC
  Administered 2022-02-01: 10 mL

## 2022-02-01 NOTE — Progress Notes (Signed)
Patient declined to stay for 30 minutes post-observation period following administration of infusion. Vital signs retaken and remained stable. Patient showed no signs of distress upon discharge.

## 2022-02-01 NOTE — Progress Notes (Signed)
Patient reports she took Tylenol prior to arrival.

## 2022-02-01 NOTE — Patient Instructions (Signed)
[facility]  Discharge Instructions: Thank you for choosing [facility] to provide your oncology and hematology care.   If you have a lab appointment with the [facility], please go directly to the [facility] and check in at the registration area.   Wear comfortable clothing and clothing appropriate for easy access to any Portacath or PICC line.   We strive to give you quality time with your provider. You may need to reschedule your appointment if you arrive late (15 or more minutes).  Arriving late affects you and other patients whose appointments are after yours.  Also, if you miss three or more appointments without notifying the office, you may be dismissed from the clinic at the provider's discretion.      For prescription refill requests, have your pharmacy contact our office and allow 72 hours for refills to be completed.    Today you received the following chemotherapy and/or immunotherapy agents: trastuzumab-anns and pertuzumab      To help prevent nausea and vomiting after your treatment, we encourage you to take your nausea medication as directed.  BELOW ARE SYMPTOMS THAT SHOULD BE REPORTED IMMEDIATELY: *FEVER GREATER THAN 100.4 F (38 C) OR HIGHER *CHILLS OR SWEATING *NAUSEA AND VOMITING THAT IS NOT CONTROLLED WITH YOUR NAUSEA MEDICATION *UNUSUAL SHORTNESS OF BREATH *UNUSUAL BRUISING OR BLEEDING *URINARY PROBLEMS (pain or burning when urinating, or frequent urination) *BOWEL PROBLEMS (unusual diarrhea, constipation, pain near the anus) TENDERNESS IN MOUTH AND THROAT WITH OR WITHOUT PRESENCE OF ULCERS (sore throat, sores in mouth, or a toothache) UNUSUAL RASH, SWELLING OR PAIN  UNUSUAL VAGINAL DISCHARGE OR ITCHING   Items with * indicate a potential emergency and should be followed up as soon as possible or go to the Emergency Department if any problems should occur.  Please show the CHEMOTHERAPY ALERT CARD or IMMUNOTHERAPY  ALERT CARD at check-in to the Emergency Department and triage nurse.  Should you have questions after your visit or need to cancel or reschedule your appointment, please contact [facility]  Dept: [phone]  and follow the prompts.  Office hours are 8:00 a.m. to 4:30 p.m. Monday - Friday. Please note that voicemails left after 4:00 p.m. may not be returned until the following business day.  We are closed weekends and major holidays. You have access to a nurse at all times for urgent questions. Please call the main number to the clinic Dept: [phone] and follow the prompts.   For any non-urgent questions, you may also contact your provider using MyChart. We now offer e-Visits for anyone 68 and older to request care online for non-urgent symptoms. For details visit [email or web address].   Also download the MyChart app! Go to the app store, search "MyChart", open the app, select [facility], and log in with your MyChart username and password.

## 2022-02-09 ENCOUNTER — Ambulatory Visit (HOSPITAL_COMMUNITY)
Admission: RE | Admit: 2022-02-09 | Discharge: 2022-02-09 | Disposition: A | Discharge status: Home / Self Care | Payer: 59 | Source: Ambulatory Visit | Attending: Adult Health | Admitting: Adult Health

## 2022-02-09 DIAGNOSIS — Z0189 Encounter for other specified special examinations: Secondary | ICD-10-CM

## 2022-02-09 DIAGNOSIS — I4892 Unspecified atrial flutter: Secondary | ICD-10-CM | POA: Insufficient documentation

## 2022-02-09 DIAGNOSIS — Z01818 Encounter for other preprocedural examination: Secondary | ICD-10-CM | POA: Diagnosis present

## 2022-02-09 DIAGNOSIS — Z170 Estrogen receptor positive status [ER+]: Secondary | ICD-10-CM

## 2022-02-09 DIAGNOSIS — C50411 Malignant neoplasm of upper-outer quadrant of right female breast: Secondary | ICD-10-CM | POA: Diagnosis not present

## 2022-02-09 DIAGNOSIS — Z8616 Personal history of COVID-19: Secondary | ICD-10-CM | POA: Insufficient documentation

## 2022-02-09 LAB — ECHOCARDIOGRAM COMPLETE
Area-P 1/2: 2.24 cm2
Calc EF: 64.3 %
MV VTI: 3.54 cm2
S' Lateral: 2.3 cm
Single Plane A2C EF: 68.8 %
Single Plane A4C EF: 64 %

## 2022-02-09 NOTE — Progress Notes (Signed)
  Echocardiogram 2D Echocardiogram has been performed.  [name] [name] 02/09/2022, 11:15 AM

## 2022-02-17 ENCOUNTER — Ambulatory Visit
Admission: RE | Admit: 2022-02-17 | Discharge: 2022-02-17 | Disposition: A | Discharge status: Home / Self Care | Payer: 59 | Source: Ambulatory Visit | Attending: Hematology and Oncology | Admitting: Hematology and Oncology

## 2022-02-17 DIAGNOSIS — Z170 Estrogen receptor positive status [ER+]: Secondary | ICD-10-CM

## 2022-02-21 ENCOUNTER — Other Ambulatory Visit: Payer: Self-pay

## 2022-02-21 DIAGNOSIS — C50411 Malignant neoplasm of upper-outer quadrant of right female breast: Secondary | ICD-10-CM

## 2022-02-22 ENCOUNTER — Inpatient Hospital Stay: Payer: 59

## 2022-02-22 ENCOUNTER — Other Ambulatory Visit: Payer: Self-pay

## 2022-02-22 VITALS — BP 145/92 | HR 72 | Temp 98.2°F | Resp 16 | Ht 62.0 in | Wt 151.9 lb

## 2022-02-22 DIAGNOSIS — Z5111 Encounter for antineoplastic chemotherapy: Secondary | ICD-10-CM | POA: Diagnosis not present

## 2022-02-22 DIAGNOSIS — Z95828 Presence of other vascular implants and grafts: Secondary | ICD-10-CM

## 2022-02-22 DIAGNOSIS — Z170 Estrogen receptor positive status [ER+]: Secondary | ICD-10-CM

## 2022-02-22 LAB — CBC WITH DIFFERENTIAL (CANCER CENTER ONLY)
Abs Immature Granulocytes: 0 10*3/uL (ref 0.00–0.07)
Basophils Absolute: 0 10*3/uL (ref 0.0–0.1)
Basophils Relative: 1 %
Eosinophils Absolute: 0.1 10*3/uL (ref 0.0–0.5)
Eosinophils Relative: 3 %
HCT: 35.8 % — ABNORMAL LOW (ref 36.0–46.0)
Hemoglobin: 12.5 g/dL (ref 12.0–15.0)
Immature Granulocytes: 0 %
Lymphocytes Relative: 30 %
Lymphs Abs: 0.9 10*3/uL (ref 0.7–4.0)
MCH: 32.3 pg (ref 26.0–34.0)
MCHC: 34.9 g/dL (ref 30.0–36.0)
MCV: 92.5 fL (ref 80.0–100.0)
Monocytes Absolute: 0.4 10*3/uL (ref 0.1–1.0)
Monocytes Relative: 14 %
Neutro Abs: 1.6 10*3/uL — ABNORMAL LOW (ref 1.7–7.7)
Neutrophils Relative %: 52 %
Platelet Count: 211 10*3/uL (ref 150–400)
RBC: 3.87 MIL/uL (ref 3.87–5.11)
RDW: 12.5 % (ref 11.5–15.5)
WBC Count: 3 10*3/uL — ABNORMAL LOW (ref 4.0–10.5)
nRBC: 0 % (ref 0.0–0.2)

## 2022-02-22 LAB — CMP (CANCER CENTER ONLY)
ALT: 15 U/L (ref 0–44)
AST: 16 U/L (ref 15–41)
Albumin: 3.9 g/dL (ref 3.5–5.0)
Alkaline Phosphatase: 75 U/L (ref 38–126)
Anion gap: 6 (ref 5–15)
BUN: 21 mg/dL (ref 8–23)
CO2: 26 mmol/L (ref 22–32)
Calcium: 9.5 mg/dL (ref 8.9–10.3)
Chloride: 108 mmol/L (ref 98–111)
Creatinine: 0.81 mg/dL (ref 0.44–1.00)
GFR, Estimated: >60 mL/min (ref >60)
Glucose, Bld: 111 mg/dL — ABNORMAL HIGH (ref 70–99)
Potassium: 3.9 mmol/L (ref 3.5–5.1)
Sodium: 140 mmol/L (ref 135–145)
Total Bilirubin: 0.5 mg/dL (ref 0.3–1.2)
Total Protein: 6.4 g/dL — ABNORMAL LOW (ref 6.5–8.1)

## 2022-02-22 MED ORDER — HEPARIN SOD (PORK) LOCK FLUSH 100 UNIT/ML IV SOLN
500.0000 [IU] | Freq: Once | INTRAVENOUS | Status: AC | PRN
  Administered 2022-02-22: 500 [IU]

## 2022-02-22 MED ORDER — SODIUM CHLORIDE 0.9% FLUSH
10.0000 mL | Freq: Once | INTRAVENOUS | Status: AC
  Administered 2022-02-22: 10 mL

## 2022-02-22 MED ORDER — TRASTUZUMAB-ANNS CHEMO 150 MG IV SOLR
6.0000 mg/kg | Freq: Once | INTRAVENOUS | Status: AC
  Administered 2022-02-22: 420 mg via INTRAVENOUS
  Filled 2022-02-22: qty 20

## 2022-02-22 MED ORDER — SODIUM CHLORIDE 0.9 % IV SOLN: Freq: Once | INTRAVENOUS | Status: AC

## 2022-02-22 MED ORDER — SODIUM CHLORIDE 0.9 % IV SOLN
420.0000 mg | Freq: Once | INTRAVENOUS | Status: AC
  Administered 2022-02-22: 420 mg via INTRAVENOUS
  Filled 2022-02-22: qty 14

## 2022-02-22 MED ORDER — SODIUM CHLORIDE 0.9% FLUSH
10.0000 mL | INTRAVENOUS | Status: DC | PRN
  Administered 2022-02-22: 10 mL

## 2022-02-22 NOTE — Progress Notes (Signed)
Patient declined to stay for 30 minute post infusion observation today. Tolerated infusion well with vital signs stable. Patient ambulated independently to lobby.

## 2022-02-22 NOTE — Progress Notes (Signed)
Per Lonn Georgia, RN, patient took pre-medications at home today.  Laray Anger, PharmD PGY-2 Pharmacy Resident Hematology/Oncology [phone]  02/22/2022 9:40 AM

## 2022-02-22 NOTE — Patient Instructions (Signed)
[facility]  Discharge Instructions: Thank you for choosing [facility] to provide your oncology and hematology care.   If you have a lab appointment with the [facility], please go directly to the [facility] and check in at the registration area.   Wear comfortable clothing and clothing appropriate for easy access to any Portacath or PICC line.   We strive to give you quality time with your provider. You may need to reschedule your appointment if you arrive late (15 or more minutes).  Arriving late affects you and other patients whose appointments are after yours.  Also, if you miss three or more appointments without notifying the office, you may be dismissed from the clinic at the provider's discretion.      For prescription refill requests, have your pharmacy contact our office and allow 72 hours for refills to be completed.    Today you received the following chemotherapy and/or immunotherapy agents: trastuzumab-anns and pertuzumab      To help prevent nausea and vomiting after your treatment, we encourage you to take your nausea medication as directed.  BELOW ARE SYMPTOMS THAT SHOULD BE REPORTED IMMEDIATELY: *FEVER GREATER THAN 100.4 F (38 C) OR HIGHER *CHILLS OR SWEATING *NAUSEA AND VOMITING THAT IS NOT CONTROLLED WITH YOUR NAUSEA MEDICATION *UNUSUAL SHORTNESS OF BREATH *UNUSUAL BRUISING OR BLEEDING *URINARY PROBLEMS (pain or burning when urinating, or frequent urination) *BOWEL PROBLEMS (unusual diarrhea, constipation, pain near the anus) TENDERNESS IN MOUTH AND THROAT WITH OR WITHOUT PRESENCE OF ULCERS (sore throat, sores in mouth, or a toothache) UNUSUAL RASH, SWELLING OR PAIN  UNUSUAL VAGINAL DISCHARGE OR ITCHING   Items with * indicate a potential emergency and should be followed up as soon as possible or go to the Emergency Department if any problems should occur.  Please show the CHEMOTHERAPY ALERT CARD or  IMMUNOTHERAPY ALERT CARD at check-in to the Emergency Department and triage nurse.  Should you have questions after your visit or need to cancel or reschedule your appointment, please contact [facility]  Dept: [phone]  and follow the prompts.  Office hours are 8:00 a.m. to 4:30 p.m. Monday - Friday. Please note that voicemails left after 4:00 p.m. may not be returned until the following business day.  We are closed weekends and major holidays. You have access to a nurse at all times for urgent questions. Please call the main number to the clinic Dept: [phone] and follow the prompts.   For any non-urgent questions, you may also contact your provider using MyChart. We now offer e-Visits for anyone 22 and older to request care online for non-urgent symptoms. For details visit [email or web address].   Also download the MyChart app! Go to the app store, search "MyChart", open the app, select [facility], and log in with your MyChart username and password.

## 2022-03-15 ENCOUNTER — Other Ambulatory Visit: Payer: Self-pay

## 2022-03-15 ENCOUNTER — Telehealth: Payer: Self-pay

## 2022-03-15 ENCOUNTER — Inpatient Hospital Stay: Payer: 59

## 2022-03-15 ENCOUNTER — Inpatient Hospital Stay: Payer: 59 | Attending: Hematology and Oncology | Admitting: Adult Health

## 2022-03-15 ENCOUNTER — Encounter: Payer: Self-pay | Admitting: Adult Health

## 2022-03-15 VITALS — BP 150/85 | HR 81 | Temp 98.8°F | Resp 18

## 2022-03-15 VITALS — BP 130/99 | HR 82 | Temp 97.7°F | Resp 18 | Ht 62.0 in | Wt 154.5 lb

## 2022-03-15 DIAGNOSIS — L03012 Cellulitis of left finger: Secondary | ICD-10-CM | POA: Insufficient documentation

## 2022-03-15 DIAGNOSIS — C50411 Malignant neoplasm of upper-outer quadrant of right female breast: Secondary | ICD-10-CM | POA: Diagnosis not present

## 2022-03-15 DIAGNOSIS — Z170 Estrogen receptor positive status [ER+]: Secondary | ICD-10-CM

## 2022-03-15 DIAGNOSIS — Z95828 Presence of other vascular implants and grafts: Secondary | ICD-10-CM

## 2022-03-15 DIAGNOSIS — Z5111 Encounter for antineoplastic chemotherapy: Secondary | ICD-10-CM | POA: Diagnosis not present

## 2022-03-15 DIAGNOSIS — Z5112 Encounter for antineoplastic immunotherapy: Secondary | ICD-10-CM | POA: Insufficient documentation

## 2022-03-15 LAB — CBC WITH DIFFERENTIAL (CANCER CENTER ONLY)
Abs Immature Granulocytes: 0.01 10*3/uL (ref 0.00–0.07)
Basophils Absolute: 0 10*3/uL (ref 0.0–0.1)
Basophils Relative: 1 %
Eosinophils Absolute: 0.1 10*3/uL (ref 0.0–0.5)
Eosinophils Relative: 2 %
HCT: 36.2 % (ref 36.0–46.0)
Hemoglobin: 12.4 g/dL (ref 12.0–15.0)
Immature Granulocytes: 0 %
Lymphocytes Relative: 23 %
Lymphs Abs: 1 10*3/uL (ref 0.7–4.0)
MCH: 31.9 pg (ref 26.0–34.0)
MCHC: 34.3 g/dL (ref 30.0–36.0)
MCV: 93.1 fL (ref 80.0–100.0)
Monocytes Absolute: 0.5 10*3/uL (ref 0.1–1.0)
Monocytes Relative: 11 %
Neutro Abs: 2.9 10*3/uL (ref 1.7–7.7)
Neutrophils Relative %: 63 %
Platelet Count: 228 10*3/uL (ref 150–400)
RBC: 3.89 MIL/uL (ref 3.87–5.11)
RDW: 12.6 % (ref 11.5–15.5)
WBC Count: 4.5 10*3/uL (ref 4.0–10.5)
nRBC: 0 % (ref 0.0–0.2)

## 2022-03-15 LAB — CMP (CANCER CENTER ONLY)
ALT: 14 U/L (ref 0–44)
AST: 17 U/L (ref 15–41)
Albumin: 4 g/dL (ref 3.5–5.0)
Alkaline Phosphatase: 85 U/L (ref 38–126)
Anion gap: 7 (ref 5–15)
BUN: 16 mg/dL (ref 8–23)
CO2: 26 mmol/L (ref 22–32)
Calcium: 9.2 mg/dL (ref 8.9–10.3)
Chloride: 107 mmol/L (ref 98–111)
Creatinine: 0.76 mg/dL (ref 0.44–1.00)
GFR, Estimated: >60 mL/min (ref >60)
Glucose, Bld: 118 mg/dL — ABNORMAL HIGH (ref 70–99)
Potassium: 3.9 mmol/L (ref 3.5–5.1)
Sodium: 140 mmol/L (ref 135–145)
Total Bilirubin: 0.4 mg/dL (ref 0.3–1.2)
Total Protein: 6.3 g/dL — ABNORMAL LOW (ref 6.5–8.1)

## 2022-03-15 MED ORDER — SODIUM CHLORIDE 0.9% FLUSH
10.0000 mL | INTRAVENOUS | Status: DC | PRN
  Administered 2022-03-15: 10 mL

## 2022-03-15 MED ORDER — SODIUM CHLORIDE 0.9 % IV SOLN
420.0000 mg | Freq: Once | INTRAVENOUS | Status: AC
  Administered 2022-03-15: 420 mg via INTRAVENOUS
  Filled 2022-03-15: qty 14

## 2022-03-15 MED ORDER — DOXYCYCLINE HYCLATE 100 MG PO TABS: 100.0000 mg | ORAL_TABLET | Freq: Two times a day (BID) | ORAL | 0 refills | Status: DC

## 2022-03-15 MED ORDER — TRASTUZUMAB-ANNS CHEMO 150 MG IV SOLR
6.0000 mg/kg | Freq: Once | INTRAVENOUS | Status: AC
  Administered 2022-03-15: 420 mg via INTRAVENOUS
  Filled 2022-03-15: qty 20

## 2022-03-15 MED ORDER — HEPARIN SOD (PORK) LOCK FLUSH 100 UNIT/ML IV SOLN
500.0000 [IU] | Freq: Once | INTRAVENOUS | Status: AC | PRN
  Administered 2022-03-15: 500 [IU]

## 2022-03-15 MED ORDER — SODIUM CHLORIDE 0.9 % IV SOLN: Freq: Once | INTRAVENOUS | Status: AC

## 2022-03-15 MED ORDER — SODIUM CHLORIDE 0.9% FLUSH
10.0000 mL | Freq: Once | INTRAVENOUS | Status: AC
  Administered 2022-03-15: 10 mL

## 2022-03-15 NOTE — Patient Instructions (Signed)
Paronychia Paronychia is an infection of the skin that surrounds a nail. It usually affects the skin around a fingernail, but it may also occur near a toenail. It often causes pain and swelling around the nail. In some cases, a collection of pus (abscess) can form near or under the nail.  This condition may develop suddenly, or it may develop gradually over a longer period. In most cases, paronychia is not serious, and it will clear up with treatment. What are the causes? This condition may be caused by bacteria or a fungus, such as yeast. The bacteria or fungus can enter the body through an opening in the skin, such as a cut or a hangnail, and cause an infection in your fingernail or toenail. Other causes may include: Recurrent injury to the fingernail or toenail area. Irritation of the base and sides of the nail (cuticle). Injury and irritation can result in inflammation, swelling, and thickened skin around the nail. What increases the risk? This condition is more likely to develop in people who: Get their hands wet often, such as those who work as dishwashers, bartenders, or housekeepers. Bite their fingernails or cuticles. Have underlying skin conditions. Have hangnails or injured fingertips. Are exposed to irritants like detergents and other chemicals. Have diabetes. What are the signs or symptoms? Symptoms of this condition include: Redness and swelling of the skin near the nail. Tenderness around the nail when you touch the area. Pus-filled bumps under the cuticle. Fluid or pus under the nail. Throbbing pain in the area. How is this diagnosed? This condition is diagnosed with a physical exam. In some cases, a sample of pus may be tested to determine what type of bacteria or fungus is causing the condition. How is this treated? Treatment depends on the cause and severity of your condition. If your condition is mild, it may clear up on its own in a few days or after soaking in warm  water. If needed, treatment may include: Antibiotic medicine, if your infection is caused by bacteria. Antifungal medicine, if your infection is caused by a fungus. A procedure to drain pus from an abscess. Anti-inflammatory medicine (corticosteroids). Removal of part of an ingrown toenail. A bandage (dressing) may be placed over the affected area if an abscess or part of a nail has been removed. Follow these instructions at home: Wound care Keep the affected area clean. Soak the affected area in warm water if told to do so by your health care provider. You may be told to do this for 20 minutes, 2-3 times a day. Keep the area dry when you are not soaking it. Do not try to drain an abscess yourself. Follow instructions from your health care provider about how to take care of the affected area. Make sure you: Wash your hands with soap and water for at least 20 seconds before and after you change your dressing. If soap and water are not available, use hand sanitizer. Change your dressing as told by your health care provider. If you had an abscess drained, check the area every day for signs of infection. Check for: Redness, swelling, or pain. Fluid or blood. Warmth. Pus or a bad smell. Medicines  Take over-the-counter and prescription medicines only as told by your health care provider. If you were prescribed an antibiotic medicine, take it as told by your health care provider. Do not stop taking the antibiotic even if you start to feel better. General instructions Avoid contact with any skin irritants or allergens.   Do not pick at the affected area. Keep all follow-up visits as told. This is important. Prevention To prevent this condition from happening again: Wear rubber gloves when washing dishes or doing other tasks that require your hands to get wet. Wear gloves if your hands might come in contact with cleaners or other chemicals. Avoid injuring your nails or fingertips. Do not bite  your nails or tear hangnails. Do not cut your nails very short. Do not cut your cuticles. Use clean nail clippers or scissors when trimming nails. Contact a health care provider if: Your symptoms get worse or do not improve with treatment. You have continued or increased fluid, blood, or pus coming from the affected area. Your affected finger, toe, or joint becomes swollen or difficult to move. You have a fever or chills. There is redness spreading away from the affected area. Summary Paronychia is an infection of the skin that surrounds a nail. It often causes pain and swelling around the nail. In some cases, a collection of pus (abscess) can form near or under the nail. This condition may be caused by bacteria or a fungus. These germs can enter the body through an opening in the skin, such as a cut or a hangnail. If your condition is mild, it may clear up on its own in a few days. If needed, treatment may include medicine or a procedure to drain pus from an abscess. To prevent this condition from happening again, wear gloves if doing tasks that require your hands to get wet or to come in contact with chemicals. Also avoid injuring your nails or fingertips. This information is not intended to replace advice given to you by your health care provider. Make sure you discuss any questions you have with your health care provider. Document Revised: 04/13/2020 Document Reviewed: 04/13/2020 Elsevier Patient Education  2023 [employer]  

## 2022-03-15 NOTE — Telephone Encounter (Signed)
Per Wilber Bihari NP, patient needs an urgent visit with a hand surgeon for her infected finger with possible abscess forming within the next 1-2 days. This LPN reached out to [facility] and they sent a urgent case message to Dr.Benfield and will be reaching out to the patient within the next 1-2 days. Patient has been made aware and verbalizes understanding.

## 2022-03-15 NOTE — Progress Notes (Signed)
Pt took tylenol at home

## 2022-03-15 NOTE — Progress Notes (Signed)
SURVIVORSHIP VISIT:    BRIEF ONCOLOGIC HISTORY:  Oncology History  Malignant neoplasm of upper-outer quadrant of right breast in female, estrogen receptor positive ([place])  03/16/2021 Initial Diagnosis   Malignant neoplasm of upper-outer quadrant of right breast in female, estrogen receptor positive ([place])   04/07/2021 - 07/23/2021 Chemotherapy   Patient is on Treatment Plan : BREAST  Docetaxel + Carboplatin + Trastuzumab + Pertuzumab  (TCHP) q21d       Genetic Testing   [facility] CustomNext Panel was Negative. Report date is 03/28/2021.  The CustomNext-Cancer+RNAinsight panel offered by [facility] includes sequencing and rearrangement analysis for the following 47 genes:  APC, ATM, AXIN2, BARD1, BMPR1A, BRCA1, BRCA2, BRIP1, CDH1, CDK4, CDKN2A, CHEK2, CTNNA1, DICER1, EPCAM, GREM1, HOXB13, KIT, MEN1, MLH1, MSH2, MSH3, MSH6, MUTYH, NBN, NF1, NTHL1, PALB2, PDGFRA, PMS2, POLD1, POLE, PTEN, RAD50, RAD51C, RAD51D, SDHA, SDHB, SDHC, SDHD, SMAD4, SMARCA4, STK11, TP53, TSC1, TSC2, and VHL.  RNA data is routinely analyzed for use in variant interpretation for all genes.   08/13/2021 -  Chemotherapy   Patient is on Treatment Plan : BREAST Trastuzumab  + Pertuzumab q21d x 13 cycles     09/08/2021 Surgery   Right breast lumpectomy: no residual cancer, 5 SLN negative   10/2021 -  Anti-estrogen oral therapy   Letrozole daily   10/25/2021 - 11/19/2021 Radiation Therapy   Site Technique Total Dose (Gy) Dose per Fx (Gy) Completed Fx Beam Energies  Breast, Right: Breast_R 3D 42.56/42.56 2.66 16/16 6X  Breast, Right: Breast_R_Bst 3D 8/8 2 4/4 6X       INTERVAL HISTORY:  Ms. Sawinski to review her survivorship care plan detailing her treatment course for breast cancer, as well as monitoring long-term side effects of that treatment, education regarding health maintenance, screening, and overall wellness and health promotion.     Overall, Ms. Mofield reports feeling quite well.  He is wearing her compression bra with   a waffle insert today to help her right breast swelling since she underwent seroma drainage by Dr. Barry Dienes.  She continues on letrozole and is tolerating it moderately well.  She does have a right fourth digit trigger finger that bothers her.  Her main concern is her left first digit fingernail infection that developed this past weekend.  She notes that it is getting worse and she started on doxycycline that she had at home at 100 mg twice daily.  REVIEW OF SYSTEMS:  Review of Systems  Constitutional:  Negative for appetite change, chills, fatigue, fever and unexpected weight change.  HENT:   Negative for hearing loss, lump/mass and trouble swallowing.   Eyes:  Negative for eye problems and icterus.  Respiratory:  Negative for chest tightness, cough and shortness of breath.   Cardiovascular:  Negative for chest pain, leg swelling and palpitations.  Gastrointestinal:  Negative for abdominal distention, abdominal pain, constipation, diarrhea, nausea and vomiting.  Endocrine: Negative for hot flashes.  Genitourinary:  Negative for difficulty urinating.   Musculoskeletal:  Negative for arthralgias.  Skin:  Negative for itching and rash.  Neurological:  Negative for dizziness, extremity weakness, headaches and numbness.  Hematological:  Negative for adenopathy. Does not bruise/bleed easily.  Psychiatric/Behavioral:  Negative for depression. The patient is not nervous/anxious.   Breast: Denies any new nodularity, masses, tenderness, nipple changes, or nipple discharge.       PAST MEDICAL/SURGICAL HISTORY:  Past Medical History:  Diagnosis Date   Abnormal bleeding in menstrual cycle    Abnormal Pap smear of vagina  Breast cancer ([place])    right breast   COVID 08/2019   Endometrial polyp    Hot flashes    Irregular heart beats    PONV (postoperative nausea and vomiting)    Typical atrial flutter ([place]) 01/01/2019   Past Surgical History:  Procedure Laterality Date   BREAST BIOPSY  Right 04/05/2021   BREAST IMPLANT REMOVAL Bilateral 2021   breast implants Bilateral 2002   BREAST LUMPECTOMY WITH RADIOACTIVE SEED AND SENTINEL LYMPH NODE BIOPSY Right 09/08/2021   Procedure: RIGHT BREAST SEED BRACKETED LUMPECTOMY AND SENTINEL LYMPH NODE BIOPSY;  Surgeon: Stark Klein, MD;  Location: [place];  Service: General;  Laterality: Right;  [place] N/A 04/06/2021   Procedure: INSERTION PORT-A-CATH;  Surgeon: Stark Klein, MD;  Location: [facility];  Service: General;  Laterality: N/A;   SHOULDER SURGERY Right    Rotator cuff repair     ALLERGIES:  Allergies  Allergen Reactions   Bee Venom Swelling     CURRENT MEDICATIONS:  Outpatient Encounter Medications as of 03/15/2022  Medication Sig   acetaminophen (TYLENOL) 500 MG tablet Take 1,000 mg by mouth every 6 (six) hours as needed for moderate pain.   Cetirizine HCl (ZYRTEC ALLERGY PO) Take by mouth.   Cholecalciferol (VITAMIN D3) 125 MCG (5000 UT) CAPS Take 10,000 Units by mouth daily.   doxycycline (VIBRA-TABS) 100 MG tablet Take 1 tablet (100 mg total) by mouth 2 (two) times daily.   letrozole (FEMARA) 2.5 MG tablet Take 1 tablet (2.5 mg total) by mouth daily.   lidocaine-prilocaine (EMLA) cream Apply 1 Application topically as needed (port access).   Mag Oxide-Vit D3-Turmeric (MAGNESIUM-VITAMIN D3-TURMERIC PO) Take by mouth.   Multiple Vitamin (MULTIVITAMIN WITH MINERALS) TABS tablet Take 1 tablet by mouth daily.   Nutritional Supplements (FRUIT & VEGETABLE DAILY) CAPS Take 1 capsule by mouth daily.   Probiotic Product (PROBIOTIC DAILY PO) Take 1 tablet by mouth daily.    Zinc Acetate, Oral, (ZINC ACETATE PO) zinc   diphenoxylate-atropine (LOMOTIL) 2.5-0.025 MG tablet Take 1 tablet by mouth 4 (four) times daily as needed for diarrhea or loose stools. (Patient not taking: Reported on 02/01/2022)   fluconazole (DIFLUCAN) 100 MG tablet Take 1 tablet (100 mg total) by mouth daily.  Take 2 tablets with first dose then 1 tab daily (Patient not taking: Reported on 02/01/2022)   oxyCODONE (OXY IR/ROXICODONE) 5 MG immediate release tablet Take 1 tablet (5 mg total) by mouth every 12 (twelve) hours as needed for severe pain. (Patient not taking: Reported on 02/01/2022)   VEOZAH 45 MG TABS TAKE 1 TABLET BY MOUTH DAILY. FOR PT WITH ER/PR + BREAST CANCER HOT FLASHES INTERFERING WITH ADL'S (Patient not taking: Reported on 02/01/2022)   [DISCONTINUED] prochlorperazine (COMPAZINE) 10 MG tablet Take 1 tablet (10 mg total) by mouth every 6 (six) hours as needed (Nausea or vomiting).   Facility-Administered Encounter Medications as of 03/15/2022  Medication   acetaminophen (TYLENOL) 325 MG tablet   diphenhydrAMINE (BENADRYL) 25 mg capsule   [COMPLETED] sodium chloride flush (NS) 0.9 % injection 10 mL     ONCOLOGIC FAMILY HISTORY:  Family History  Problem Relation Age of Onset   Heart attack Mother      SOCIAL HISTORY:  Social History   Socioeconomic History   Marital status: Married    Spouse name: Not on file   Number of children: 2   Years of education: Not on file  Highest education level: Not on file  Occupational History   Not on file  Tobacco Use   Smoking status: Never   Smokeless tobacco: Never  Vaping Use   Vaping Use: Never used  Substance and Sexual Activity   Alcohol use: Yes    Alcohol/week: 14.0 standard drinks of alcohol    Types: 14 Standard drinks or equivalent per week    Comment: 2 white claws every night with dinner   Drug use: Not Currently   Sexual activity: Not on file  Other Topics Concern   Not on file  Social History Narrative   Not on file   Social Determinants of Health   Financial Resource Strain: Not on file  Food Insecurity: Not on file  Transportation Needs: Not on file  Physical Activity: Not on file  Stress: Not on file  Social Connections: Not on file  Intimate Partner Violence: Not on file     OBSERVATIONS/OBJECTIVE:   BP (!) 130/99 (BP Location: Left Arm, Patient Position: Sitting)   Pulse 82   Temp 97.7 F (36.5 C) (Temporal)   Resp 18   Ht 5' 2"$  (1.575 m)   Wt 154 lb 8 oz (70.1 kg)   LMP 09/09/2019   SpO2 100%   BMI 28.26 kg/m  GENERAL: Patient is a well appearing female in no acute distress HEENT:  Sclerae anicteric.  Oropharynx clear and moist. No ulcerations or evidence of oropharyngeal candidiasis. Neck is supple.  NODES:  No cervical, supraclavicular, or axillary lymphadenopathy palpated.  BREAST EXAM:  Deferred. LUNGS:  Clear to auscultation bilaterally.  No wheezes or rhonchi. HEART:  Regular rate and rhythm. No murmur appreciated. ABDOMEN:  Soft, nontender.  Positive, normoactive bowel sounds. No organomegaly palpated. MSK:  No focal spinal tenderness to palpation. Full range of motion bilaterally in the upper extremities. EXTREMITIES:  No peripheral edema.   SKIN:  Clear with no obvious rashes or skin changes.  See pictures below NEURO:  Nonfocal. Well oriented.  Appropriate affect. Left first digit pictures on March 15, 2022      LABORATORY DATA:  None for this visit.  DIAGNOSTIC IMAGING:  None for this visit.      ASSESSMENT AND PLAN:  Ms.. Perkowski is a pleasant 62 y.o. 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  She presents to the Survivorship Clinic for our initial meeting and routine follow-up post-completion of treatment for breast cancer.    1. Stage IB right breast cancer:  Ms. Jackovich is continuing to recover from definitive treatment for breast cancer. She will follow-up with her medical oncologist, Dr. Chryl Heck in 03/2022 with history and physical exam per surveillance protocol.  She will continue her anti-estrogen therapy with  Letrozole. Thus far, she is tolerating the Letrozole well, with minimal side effects. Her mammogram is due 02/2022; orders placed today.  Today, a comprehensive survivorship care plan and treatment summary was reviewed with the patient today detailing her breast cancer diagnosis, treatment course, potential late/long-term effects of treatment, appropriate follow-up care with recommendations for the future, and patient education resources.  A copy of this summary, along with a letter will be sent to the patient's primary care provider via mail/fax/In Basket message after today's visit.    2.  Acute left first digit paronychia: I sent in doxycycline 100 mg p.o. twice daily I gave  her a handout on paronychia.  There is beginning to be some swelling around the nailbed and I am concerned that an abscess is trying to form.  My recommendation is for her to get in with a hand surgeon for evaluation and management.  Out of times this can be difficult and patients are recommended to go to the emergency room which she understandably declines.  [facility] is on-call today and so my nurse is calling to see if they use her insurance and also the best way to get her in for an urgent appointment.  I went ahead and sent in doxycycline twice daily since she has already started this antibiotic and I am unsure how quickly we can get her in at [facility].  She is hopeful that she can talk to them about her fourth right digit trigger finger as well.  3. Bone health:  Given Ms. Blacketer age/history of breast cancer and her current treatment regimen including anti-estrogen therapy with letrozole, she is at risk for bone demineralization.  Her last DEXA scan was January 2024 and was normal.  Repeat is recommended to occur in January 2026. She was given education on specific activities to promote bone health.  4. Cancer screening:  Due to Ms. Pieper's history and her age, she should receive screening for skin cancers, colon cancer, and  gynecologic cancers.  The information and recommendations are listed on the patient's comprehensive care plan/treatment summary and were reviewed in detail with the patient.    5. Health maintenance and wellness promotion: Ms. Tinner was encouraged to consume 5-7 servings of fruits and vegetables per day. We reviewed the "Nutrition Rainbow" handout.  She was also encouraged to engage in moderate to vigorous exercise for 30 minutes per day most days of the week. She was instructed to limit her alcohol consumption and continue to abstain from tobacco use.     6. Support services/counseling: It is not uncommon for this period of the patient's cancer care trajectory to be one of many emotions and stressors.     She was given information regarding our available services and encouraged to contact me with any questions or for help enrolling in any of our support group/programs.    Follow up instructions:    -Return to cancer center March 2024 for her final cycle of Herceptin Perjeta -Mammogram due in February 2024 -Bone density testing due January 2026 -Urgent referral to hand surgery at [facility], [facility], [facility] pending -Follow up with surgery after March 2024 for port removal. -She is welcome to return back to the Survivorship Clinic at any time; no additional follow-up needed at this time.  -Consider referral back to survivorship as a long-term survivor for continued surveillance  The patient was provided an opportunity to ask questions and all were answered. The patient agreed with the plan and demonstrated an understanding of the instructions.   Total encounter time:45 minutes*in face-to-face visit time, chart review, lab review, care coordination, order entry, and documentation of the encounter time.    Wilber Bihari, NP 03/15/22 10:33 AM Medical Oncology and Hematology [facility] [place], [place] [address] Tel. [phone]    Fax. [phone]  *Total Encounter Time as  defined by the Centers for Medicare and Medicaid Services includes, in addition to the face-to-face time of a patient visit (documented in the note above) non-face-to-face time: obtaining and reviewing outside history, ordering and reviewing medications, tests or procedures, care coordination (communications with other health care professionals or caregivers) and  documentation in the medical record.

## 2022-03-15 NOTE — Progress Notes (Signed)
Pt decline 30 min post perjta wait. VSS at discharge.

## 2022-03-17 ENCOUNTER — Telehealth: Payer: Self-pay | Admitting: Adult Health

## 2022-03-17 NOTE — Telephone Encounter (Signed)
Rescheduled and cancelled appointments per 2/20 los. Patient is aware of the changes made to her upcoming appointments.

## 2022-03-25 ENCOUNTER — Other Ambulatory Visit: Payer: Self-pay

## 2022-04-04 ENCOUNTER — Ambulatory Visit: Payer: 59 | Attending: Radiation Oncology

## 2022-04-04 VITALS — Wt 152.2 lb

## 2022-04-04 DIAGNOSIS — Z483 Aftercare following surgery for neoplasm: Secondary | ICD-10-CM | POA: Insufficient documentation

## 2022-04-04 NOTE — Therapy (Signed)
OUTPATIENT PHYSICAL THERAPY SOZO SCREENING NOTE   Patient Name: [name] [name] MRN: [id number] DOB:[birth year], 62 y.o., female Today's Date: 04/04/2022  PCP: [name], MD REFERRING PROVIDER: [name], MD   PT End of Session - 04/04/22 [phone]     Visit Number 8   # unchanged due to screen only   PT Start Time 0949    PT Stop Time 0953    PT Time Calculation (min) 4 min    Activity Tolerance Patient tolerated treatment well    Behavior During Therapy [facility] for tasks assessed/performed             Past Medical History:  Diagnosis Date   Abnormal bleeding in menstrual cycle    Abnormal Pap smear of vagina    Breast cancer ([place])    right breast   COVID 08/2019   Endometrial polyp    Hot flashes    Irregular heart beats    PONV (postoperative nausea and vomiting)    Typical atrial flutter ([place]) 01/01/2019   Past Surgical History:  Procedure Laterality Date   BREAST BIOPSY Right 04/05/2021   BREAST IMPLANT REMOVAL Bilateral 2021   breast implants Bilateral 2002   BREAST LUMPECTOMY WITH RADIOACTIVE SEED AND SENTINEL LYMPH NODE BIOPSY Right 09/08/2021   Procedure: RIGHT BREAST SEED BRACKETED LUMPECTOMY AND SENTINEL LYMPH NODE BIOPSY;  Surgeon: Stark Klein, MD;  Location: [place];  Service: General;  Laterality: Right;  [place] N/A 04/06/2021   Procedure: INSERTION PORT-A-CATH;  Surgeon: Stark Klein, MD;  Location: [place];  Service: General;  Laterality: N/A;   SHOULDER SURGERY Right    Rotator cuff repair   Patient Active Problem List   Diagnosis Date Noted   Dysuria 06/08/2021   Port-A-Cath in place 05/18/2021   Chemotherapy induced diarrhea 04/27/2021   Rash and nonspecific skin eruption 04/27/2021   UTI (urinary tract infection) 04/27/2021   Bone pain due to granulocyte colony stimulating factor 04/27/2021   Transaminitis 04/27/2021   Genetic testing 03/31/2021   Malignant neoplasm of  upper-outer quadrant of right breast in female, estrogen receptor positive ([place]) 03/16/2021   History of cervical dysplasia 02/17/2021   Hot flashes due to menopause 09/16/2019   Typical atrial flutter ([place]) 01/01/2019    REFERRING DIAG: right breast cancer at risk for lymphedema  THERAPY DIAG:  Aftercare following surgery for neoplasm  PERTINENT HISTORY: Patient was diagnosed on 03/04/2021 with right grade II invasive ductal carcinoma breast cancer. It measures 2.2 cm and is located in the upper outer quadrant. It is triple positive with a Ki67 of 25%.  She has a history of breast implants and had a right rotator cuff repair in 2019. Neoadjuvant chemotherapy. Rt lumpectomy and SLNB on 09/08/21 with 5 negative nodes removed.  Drainage noted from incision 09/28/21 with infection. Will be having radiation.   PRECAUTIONS: right UE Lymphedema risk, None  SUBJECTIVE: Pt returns for her 3 month L-Dex screen.   PAIN:  Are you having pain? No  SOZO SCREENING: Patient was assessed today using the SOZO machine to determine the lymphedema index score. This was compared to her baseline score. It was determined that she is within the recommended range when compared to her baseline and no further action is needed at this time. She will continue SOZO screenings. These are done every 3 months for 2 years post operatively followed by every 6 months for 2 years, and then annually.  L-DEX FLOWSHEETS - 04/04/22 0900       L-DEX LYMPHEDEMA SCREENING   Measurement Type Unilateral    L-DEX MEASUREMENT EXTREMITY Upper Extremity    POSITION  Standing    DOMINANT SIDE Right    At Risk Side Right    BASELINE SCORE (UNILATERAL) -0.9    L-DEX SCORE (UNILATERAL) -1.5    VALUE CHANGE (UNILAT) -0.6               Otelia Limes, PTA 04/04/2022, 9:52 AM

## 2022-04-05 ENCOUNTER — Inpatient Hospital Stay: Payer: 59 | Attending: Hematology and Oncology

## 2022-04-05 ENCOUNTER — Other Ambulatory Visit: Payer: Self-pay

## 2022-04-05 ENCOUNTER — Inpatient Hospital Stay: Payer: 59

## 2022-04-05 ENCOUNTER — Inpatient Hospital Stay: Payer: 59 | Admitting: Hematology and Oncology

## 2022-04-05 VITALS — BP 150/98 | HR 74 | Resp 16

## 2022-04-05 DIAGNOSIS — Z5111 Encounter for antineoplastic chemotherapy: Secondary | ICD-10-CM | POA: Diagnosis present

## 2022-04-05 DIAGNOSIS — Z79811 Long term (current) use of aromatase inhibitors: Secondary | ICD-10-CM | POA: Diagnosis not present

## 2022-04-05 DIAGNOSIS — C50411 Malignant neoplasm of upper-outer quadrant of right female breast: Secondary | ICD-10-CM

## 2022-04-05 DIAGNOSIS — Z170 Estrogen receptor positive status [ER+]: Secondary | ICD-10-CM | POA: Diagnosis not present

## 2022-04-05 DIAGNOSIS — Z95828 Presence of other vascular implants and grafts: Secondary | ICD-10-CM

## 2022-04-05 DIAGNOSIS — Z5112 Encounter for antineoplastic immunotherapy: Secondary | ICD-10-CM | POA: Insufficient documentation

## 2022-04-05 LAB — CMP (CANCER CENTER ONLY)
ALT: 19 U/L (ref 0–44)
AST: 19 U/L (ref 15–41)
Albumin: 4 g/dL (ref 3.5–5.0)
Alkaline Phosphatase: 83 U/L (ref 38–126)
Anion gap: 6 (ref 5–15)
BUN: 20 mg/dL (ref 8–23)
CO2: 27 mmol/L (ref 22–32)
Calcium: 9.3 mg/dL (ref 8.9–10.3)
Chloride: 105 mmol/L (ref 98–111)
Creatinine: 0.85 mg/dL (ref 0.44–1.00)
GFR, Estimated: >60 mL/min (ref >60)
Glucose, Bld: 113 mg/dL — ABNORMAL HIGH (ref 70–99)
Potassium: 4 mmol/L (ref 3.5–5.1)
Sodium: 138 mmol/L (ref 135–145)
Total Bilirubin: 0.4 mg/dL (ref 0.3–1.2)
Total Protein: 6.5 g/dL (ref 6.5–8.1)

## 2022-04-05 LAB — CBC WITH DIFFERENTIAL (CANCER CENTER ONLY)
Abs Immature Granulocytes: 0.01 10*3/uL (ref 0.00–0.07)
Basophils Absolute: 0 10*3/uL (ref 0.0–0.1)
Basophils Relative: 1 %
Eosinophils Absolute: 0.2 10*3/uL (ref 0.0–0.5)
Eosinophils Relative: 5 %
HCT: 37.1 % (ref 36.0–46.0)
Hemoglobin: 12.7 g/dL (ref 12.0–15.0)
Immature Granulocytes: 0 %
Lymphocytes Relative: 24 %
Lymphs Abs: 0.9 10*3/uL (ref 0.7–4.0)
MCH: 31.9 pg (ref 26.0–34.0)
MCHC: 34.2 g/dL (ref 30.0–36.0)
MCV: 93.2 fL (ref 80.0–100.0)
Monocytes Absolute: 0.6 10*3/uL (ref 0.1–1.0)
Monocytes Relative: 15 %
Neutro Abs: 2 10*3/uL (ref 1.7–7.7)
Neutrophils Relative %: 55 %
Platelet Count: 218 10*3/uL (ref 150–400)
RBC: 3.98 MIL/uL (ref 3.87–5.11)
RDW: 12.7 % (ref 11.5–15.5)
WBC Count: 3.7 10*3/uL — ABNORMAL LOW (ref 4.0–10.5)
nRBC: 0 % (ref 0.0–0.2)

## 2022-04-05 LAB — LIPID PANEL
Cholesterol: 202 mg/dL — ABNORMAL HIGH (ref 0–200)
HDL: 92 mg/dL (ref >40)
LDL Cholesterol: 99 mg/dL (ref 0–99)
Total CHOL/HDL Ratio: 2.2 RATIO
Triglycerides: 57 mg/dL (ref <150)
VLDL: 11 mg/dL (ref 0–40)

## 2022-04-05 LAB — VITAMIN D 25 HYDROXY (VIT D DEFICIENCY, FRACTURES): Vit D, 25-Hydroxy: 84.11 ng/mL (ref 30–100)

## 2022-04-05 MED ORDER — SODIUM CHLORIDE 0.9% FLUSH
10.0000 mL | Freq: Once | INTRAVENOUS | Status: AC
  Administered 2022-04-05: 10 mL

## 2022-04-05 MED ORDER — TRASTUZUMAB-ANNS CHEMO 150 MG IV SOLR
6.0000 mg/kg | Freq: Once | INTRAVENOUS | Status: AC
  Administered 2022-04-05: 420 mg via INTRAVENOUS
  Filled 2022-04-05: qty 20

## 2022-04-05 MED ORDER — SODIUM CHLORIDE 0.9 % IV SOLN: Freq: Once | INTRAVENOUS | Status: AC

## 2022-04-05 MED ORDER — SODIUM CHLORIDE 0.9 % IV SOLN
420.0000 mg | Freq: Once | INTRAVENOUS | Status: AC
  Administered 2022-04-05: 420 mg via INTRAVENOUS
  Filled 2022-04-05: qty 14

## 2022-04-05 MED ORDER — HEPARIN SOD (PORK) LOCK FLUSH 100 UNIT/ML IV SOLN
500.0000 [IU] | Freq: Once | INTRAVENOUS | Status: AC | PRN
  Administered 2022-04-05: 500 [IU]

## 2022-04-05 MED ORDER — SODIUM CHLORIDE 0.9% FLUSH
10.0000 mL | INTRAVENOUS | Status: DC | PRN
  Administered 2022-04-05: 10 mL

## 2022-04-05 NOTE — Progress Notes (Signed)
Per patient, took APAP prior to infusion at approximately 0745 this morning.

## 2022-04-05 NOTE — Progress Notes (Signed)
Oncology History  Malignant neoplasm of upper-outer quadrant of right breast in female, estrogen receptor positive ([place])  03/16/2021 Initial Diagnosis   Malignant neoplasm of upper-outer quadrant of right breast in female, estrogen receptor positive ([place])   04/07/2021 - 07/23/2021 Chemotherapy   Patient is on Treatment Plan : BREAST  Docetaxel + Carboplatin + Trastuzumab + Pertuzumab  (TCHP) q21d       Genetic Testing   [facility] CustomNext Panel was Negative. Report date is 03/28/2021.  The CustomNext-Cancer+RNAinsight panel offered by [facility] includes sequencing and rearrangement analysis for the following 47 genes:  APC, ATM, AXIN2, BARD1, BMPR1A, BRCA1, BRCA2, BRIP1, CDH1, CDK4, CDKN2A, CHEK2, CTNNA1, DICER1, EPCAM, GREM1, HOXB13, KIT, MEN1, MLH1, MSH2, MSH3, MSH6, MUTYH, NBN, NF1, NTHL1, PALB2, PDGFRA, PMS2, POLD1, POLE, PTEN, RAD50, RAD51C, RAD51D, SDHA, SDHB, SDHC, SDHD, SMAD4, SMARCA4, STK11, TP53, TSC1, TSC2, and VHL.  RNA data is routinely analyzed for use in variant interpretation for all genes.   08/13/2021 -  Chemotherapy   Patient is on Treatment Plan : BREAST Trastuzumab  + Pertuzumab q21d x 13 cycles     09/08/2021 Surgery   Right breast lumpectomy: no residual cancer, 5 SLN negative   10/2021 -  Anti-estrogen oral therapy   Letrozole daily   10/25/2021 - 11/19/2021 Radiation Therapy   Site Technique Total Dose (Gy) Dose per Fx (Gy) Completed Fx Beam Energies  Breast, Right: Breast_R 3D 42.56/42.56 2.66 16/16 6X  Breast, Right: Breast_R_Bst 3D 8/8 2 4/4 6X      INTERVAL HISTORY:   [name] [name] is here for follow up on herceptin/perjeta and letrozole. She is quite excited that today is her last treatment.  She tells me that she is tired of follow-ups, co-pays and her insurance has been costing her quite a bit of money currently.  She is doing quite well with Herceptin and pertuzumab.  She has noticed some skin rash on her face which goes away in between her cycles.  Otherwise  she struggles with hot flashes every morning between 2 to 4 AM.  She could not Ve[place] as a prescription since it was going to cost her about 500 bucks.  She is however taking letrozole as prescribed.  She would like to only come once a year down the line.  She is very confident that the cancer would not return and she does not want to pay for a lot of appointments.  She most recently had diagnostic mammogram of the right breast.  She is due for bilateral mammogram.  She is curious about her vitamin D levels and the need for additional vitamin D supplementation.  She is otherwise overall happy about the progress.  Rest of the pertinent 10 point ROS reviewed and negative  REVIEW OF SYSTEMS:  Review of Systems  Constitutional:  Negative for appetite change, chills, fatigue, fever and unexpected weight change.  HENT:   Negative for hearing loss, lump/mass and trouble swallowing.   Eyes:  Negative for eye problems and icterus.  Respiratory:  Negative for chest tightness, cough and shortness of breath.   Cardiovascular:  Negative for chest pain, leg swelling and palpitations.  Gastrointestinal:  Negative for abdominal distention, abdominal pain, constipation, diarrhea, nausea and vomiting.  Endocrine: Negative for hot flashes.  Genitourinary:  Negative for difficulty urinating.   Musculoskeletal:  Negative for arthralgias.  Skin:  Negative for itching and rash.  Neurological:  Negative for dizziness, extremity weakness, headaches and numbness.  Hematological:  Negative for adenopathy. Does not bruise/bleed easily.  Psychiatric/Behavioral:  Negative for depression. The patient is not nervous/anxious.   Breast: Denies any new nodularity, masses, tenderness, nipple changes, or nipple discharge.    PAST MEDICAL/SURGICAL HISTORY:  Past Medical History:  Diagnosis Date   Abnormal bleeding in menstrual cycle    Abnormal Pap smear of vagina    Breast cancer ([place])    right breast   COVID 08/2019    Endometrial polyp    Hot flashes    Irregular heart beats    PONV (postoperative nausea and vomiting)    Typical atrial flutter ([place]) 01/01/2019   Past Surgical History:  Procedure Laterality Date   BREAST BIOPSY Right 04/05/2021   BREAST IMPLANT REMOVAL Bilateral 2021   breast implants Bilateral 2002   BREAST LUMPECTOMY WITH RADIOACTIVE SEED AND SENTINEL LYMPH NODE BIOPSY Right 09/08/2021   Procedure: RIGHT BREAST SEED BRACKETED LUMPECTOMY AND SENTINEL LYMPH NODE BIOPSY;  Surgeon: Stark Klein, MD;  Location: [facility];  Service: General;  Laterality: Right;  [place] N/A 04/06/2021   Procedure: INSERTION PORT-A-CATH;  Surgeon: Stark Klein, MD;  Location: [facility] [facility];  Service: General;  Laterality: N/A;   SHOULDER SURGERY Right    Rotator cuff repair     ALLERGIES:  Allergies  Allergen Reactions   Bee Venom Swelling     CURRENT MEDICATIONS:  Outpatient Encounter Medications as of 04/05/2022  Medication Sig   acetaminophen (TYLENOL) 500 MG tablet Take 1,000 mg by mouth every 6 (six) hours as needed for moderate pain.   Cetirizine HCl (ZYRTEC ALLERGY PO) Take by mouth.   Cholecalciferol (VITAMIN D3) 125 MCG (5000 UT) CAPS Take 10,000 Units by mouth daily.   diphenoxylate-atropine (LOMOTIL) 2.5-0.025 MG tablet Take 1 tablet by mouth 4 (four) times daily as needed for diarrhea or loose stools. (Patient not taking: Reported on 02/01/2022)   doxycycline (VIBRA-TABS) 100 MG tablet Take 1 tablet (100 mg total) by mouth 2 (two) times daily.   fluconazole (DIFLUCAN) 100 MG tablet Take 1 tablet (100 mg total) by mouth daily. Take 2 tablets with first dose then 1 tab daily (Patient not taking: Reported on 02/01/2022)   letrozole (FEMARA) 2.5 MG tablet Take 1 tablet (2.5 mg total) by mouth daily.   lidocaine-prilocaine (EMLA) cream Apply 1 Application topically as needed (port access).   Mag Oxide-Vit D3-Turmeric (MAGNESIUM-VITAMIN D3-TURMERIC PO)  Take by mouth.   Multiple Vitamin (MULTIVITAMIN WITH MINERALS) TABS tablet Take 1 tablet by mouth daily.   Nutritional Supplements (FRUIT & VEGETABLE DAILY) CAPS Take 1 capsule by mouth daily.   oxyCODONE (OXY IR/ROXICODONE) 5 MG immediate release tablet Take 1 tablet (5 mg total) by mouth every 12 (twelve) hours as needed for severe pain. (Patient not taking: Reported on 02/01/2022)   Probiotic Product (PROBIOTIC DAILY PO) Take 1 tablet by mouth daily.    VEOZAH 45 MG TABS TAKE 1 TABLET BY MOUTH DAILY. FOR PT WITH ER/PR + BREAST CANCER HOT FLASHES INTERFERING WITH ADL'S (Patient not taking: Reported on 02/01/2022)   Zinc Acetate, Oral, (ZINC ACETATE PO) zinc   [DISCONTINUED] prochlorperazine (COMPAZINE) 10 MG tablet Take 1 tablet (10 mg total) by mouth every 6 (six) hours as needed (Nausea or vomiting).   Facility-Administered Encounter Medications as of 04/05/2022  Medication   acetaminophen (TYLENOL) 325 MG tablet   diphenhydrAMINE (BENADRYL) 25 mg capsule     ONCOLOGIC FAMILY HISTORY:  Family History  Problem Relation Age of Onset   Heart attack  Mother      SOCIAL HISTORY:  Social History   Socioeconomic History   Marital status: Married    Spouse name: Not on file   Number of children: 2   Years of education: Not on file   Highest education level: Not on file  Occupational History   Not on file  Tobacco Use   Smoking status: Never   Smokeless tobacco: Never  Vaping Use   Vaping Use: Never used  Substance and Sexual Activity   Alcohol use: Yes    Alcohol/week: 14.0 standard drinks of alcohol    Types: 14 Standard drinks or equivalent per week    Comment: 2 white claws every night with dinner   Drug use: Not Currently   Sexual activity: Not on file  Other Topics Concern   Not on file  Social History Narrative   Not on file   Social Determinants of Health   Financial Resource Strain: Not on file  Food Insecurity: Not on file  Transportation Needs: Not on file   Physical Activity: Not on file  Stress: Not on file  Social Connections: Not on file  Intimate Partner Violence: Not on file     OBSERVATIONS/OBJECTIVE:  BP (!) 142/84 (BP Location: Left Arm, Patient Position: Sitting)   Pulse 69   Temp 98.1 F (36.7 C) (Temporal)   Resp 16   Ht '5\' 2"'$  (1.575 m)   Wt 153 lb 4.8 oz (69.5 kg)   LMP 09/09/2019   SpO2 98%   BMI 28.04 kg/m   Physical Exam Constitutional:      Appearance: Normal appearance.  Cardiovascular:     Rate and Rhythm: Normal rate and regular rhythm.     Pulses: Normal pulses.     Heart sounds: Normal heart sounds.  Pulmonary:     Effort: Pulmonary effort is normal.     Breath sounds: Normal breath sounds.  Chest:     Comments: Right breast s.p surgical changes, small post op seroma. Abdominal:     General: Abdomen is flat.     Palpations: Abdomen is soft.  Musculoskeletal:        General: No swelling.     Cervical back: Normal range of motion and neck supple. No rigidity.  Lymphadenopathy:     Cervical: No cervical adenopathy.  Skin:    General: Skin is warm and dry.  Neurological:     General: No focal deficit present.     Mental Status: She is alert.    LABORATORY DATA:  None for this visit.  DIAGNOSTIC IMAGING:  None for this visit.    ASSESSMENT ND PLAN:  [name] [name] is a pleasant 62 y.o. 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  She [facility] for our initial meeting and routine follow-up post-completion of treatment for breast cancer.    1. Stage IB right breast cancer: She got neoadjuvant TCHP, had complete pathologic response and is now almost done with adjuvant Herceptin and pertuzumab.  She will also continue daily letrozole for 5  years.  She started this around October 2023.  She has been tolerating it really well so far.  She will continue annual diagnostic mammograms for the first 2 years followed by screening or diagnostic mammogram for an additional 3 years.  We have discussed about following up in  about 6 months but patient is trying to cut down because because of heavy co-pays and would like to return to clinic in 1 year or sooner as needed.  She reassures [place] that she will call us as soon as she has any new concerns.  2. Bone health:  Given Ms. Organ age/history of breast cancer and her current treatment regimen including anti-estrogen therapy with letrozole, she is at risk for bone demineralization.  Her last DEXA scan was January 2024 and was normal.  Repeat is recommended to occur in January 2026. She was given education on specific activities to promote bone health.  3.  Vasomotor symptoms of menopause, she could not get me was a prescription.  We have previously discussed about trying gabapentin, Effexor.  At this time she is not interested in pursuing any new treatments.  She tells me that the letrozole did not make her vasomotor symptoms any worse than before.  4.  Paronychia, resolved.  This is unrelated to her current treatment.  5.  Patient wanted her lipid panel checked today, this has been ordered.  With regards to vitamin D supplementation, I recommended we check for this as well since she is on 10,000 units daily.  If she does not have vitamin D deficiency or insufficiency, she only needs maintenance dosing at 1 to 2000 units daily.  Total time spent: 30 minutes  *Total Encounter Time as defined by the Centers for Medicare and Medicaid Services includes, in addition to the face-to-face time of a patient visit (documented in the note above) non-face-to-face time: obtaining and reviewing outside history, ordering and reviewing medications, tests or procedures, care coordination (communications with other  health care professionals or caregivers) and documentation in the medical record.

## 2022-04-05 NOTE — Patient Instructions (Signed)
[facility]  Discharge Instructions: Thank you for choosing [facility] to provide your oncology and hematology care.   If you have a lab appointment with the Cancer Center, please go directly to the Cancer Center and check in at the registration area.   Wear comfortable clothing and clothing appropriate for easy access to any Portacath or PICC line.   We strive to give you quality time with your provider. You may need to reschedule your appointment if you arrive late (15 or more minutes).  Arriving late affects you and other patients whose appointments are after yours.  Also, if you miss three or more appointments without notifying the office, you may be dismissed from the clinic at the provider's discretion.      For prescription refill requests, have your pharmacy contact our office and allow 72 hours for refills to be completed.    Today you received the following chemotherapy and/or immunotherapy agents: trastuzumab-anns and pertuzumab      To help prevent nausea and vomiting after your treatment, we encourage you to take your nausea medication as directed.  BELOW ARE SYMPTOMS THAT SHOULD BE REPORTED IMMEDIATELY: *FEVER GREATER THAN 100.4 F (38 C) OR HIGHER *CHILLS OR SWEATING *NAUSEA AND VOMITING THAT IS NOT CONTROLLED WITH YOUR NAUSEA MEDICATION *UNUSUAL SHORTNESS OF BREATH *UNUSUAL BRUISING OR BLEEDING *URINARY PROBLEMS (pain or burning when urinating, or frequent urination) *BOWEL PROBLEMS (unusual diarrhea, constipation, pain near the anus) TENDERNESS IN MOUTH AND THROAT WITH OR WITHOUT PRESENCE OF ULCERS (sore throat, sores in mouth, or a toothache) UNUSUAL RASH, SWELLING OR PAIN  UNUSUAL VAGINAL DISCHARGE OR ITCHING   Items with * indicate a potential emergency and should be followed up as soon as possible or go to the Emergency Department if any problems should occur.  Please show the CHEMOTHERAPY ALERT CARD or  IMMUNOTHERAPY ALERT CARD at check-in to the Emergency Department and triage nurse.  Should you have questions after your visit or need to cancel or reschedule your appointment, please contact [facility]  Dept: [phone]  and follow the prompts.  Office hours are 8:00 a.m. to 4:30 p.m. Monday - Friday. Please note that voicemails left after 4:00 p.m. may not be returned until the following business day.  We are closed weekends and major holidays. You have access to a nurse at all times for urgent questions. Please call the main number to the clinic Dept: [phone] and follow the prompts.   For any non-urgent questions, you may also contact your provider using MyChart. We now offer e-Visits for anyone 18 and older to request care online for non-urgent symptoms. For details visit [email or web address].   Also download the MyChart app! Go to the app store, search "MyChart", open the app, select [facility], and log in with your MyChart username and password.  

## 2022-04-05 NOTE — Progress Notes (Signed)
Patient declined to stay for 30 minute post perjeta infusion observation. VSS at discharge. Pt ambulated independently to lobby.

## 2022-04-06 ENCOUNTER — Telehealth: Payer: Self-pay | Admitting: Hematology and Oncology

## 2022-04-06 NOTE — Telephone Encounter (Signed)
Spoke with patient confirming appointment  

## 2022-04-13 ENCOUNTER — Telehealth: Payer: Self-pay | Admitting: *Deleted

## 2022-04-13 NOTE — Telephone Encounter (Addendum)
-----   Message from Benay Pike, MD sent at 04/06/2022  8:02 AM EDT ----- She wanted lipid panel done, looks good overall. Vit D levels are also normal. She can take maintenance 1000 IU daily at this time.  This RN called pt and obtained her identified VM- detailed message left per above with this RN's name for return call if needed

## 2022-04-26 ENCOUNTER — Other Ambulatory Visit: Payer: Self-pay

## 2022-04-26 ENCOUNTER — Inpatient Hospital Stay: Payer: 59

## 2022-04-26 ENCOUNTER — Inpatient Hospital Stay: Payer: 59 | Admitting: Hematology and Oncology

## 2022-05-05 ENCOUNTER — Other Ambulatory Visit: Payer: Self-pay | Admitting: Hematology and Oncology

## 2022-05-16 ENCOUNTER — Encounter: Payer: Self-pay | Admitting: *Deleted

## 2022-05-16 DIAGNOSIS — Z170 Estrogen receptor positive status [ER+]: Secondary | ICD-10-CM

## 2022-05-17 ENCOUNTER — Inpatient Hospital Stay: Payer: 59

## 2022-06-01 ENCOUNTER — Encounter (HOSPITAL_BASED_OUTPATIENT_CLINIC_OR_DEPARTMENT_OTHER): Payer: Self-pay | Admitting: General Surgery

## 2022-06-01 ENCOUNTER — Telehealth: Payer: Self-pay

## 2022-06-01 ENCOUNTER — Other Ambulatory Visit: Payer: Self-pay

## 2022-06-01 NOTE — Telephone Encounter (Signed)
Pt called and LVM regarding her surgery scheduled with Dr Donell Beers tro remove port is not covered under insurance because [facility] is out of network. She needs a provider who is in network to fill out paperwork for [employer] regarding necessity for port removal. Attempted to return pt's call. LVM for call back and send message to Dr Arita Miss RN.

## 2022-06-02 ENCOUNTER — Telehealth: Payer: Self-pay | Admitting: *Deleted

## 2022-06-02 NOTE — Telephone Encounter (Signed)
This RN spoke with pt today per her call stating need of this office to contact her new [facility] provider and give information relating to need for authorization per her Dr Donell Beers who is not included in her list of providers.  She states they need Dr Arita Miss full name, address,NPI number,tax ID, procedure code,dx code,POS and to state  unit.  [name] [name] is scheduled for port removal at 6 am on 5/14.  This RN called given number of [phone].  This RN called above and spoke with "an advocate" - informed of need to provide above information (RN has available) to allow for authorization of planned procedure by surgeon who placed the port for continuity of care.  This RN was informed pt's policy has Dr Zola Button as her primary MD and above information would have to be sent in by her office.  She states when the patient signed up for her current plan- above MD was named for pt to obtain any needed [facility] authorization. She stated the office this RN is calling from is not contracted to obtain [employer] exceptions per the patient's policy.  This RN called pt to inform her of above- with [name] [name] stating she does not see Dr Zola Button as her primary. She actually has not seen a primary since prior to her breast cancer diagnosis.  This RN explained per the policy she has she will need to either call her known primary to see if they are on her plan so the can send  information or if needed to be seen by Dr Zola Button.   Again apologized but per her insurance provider they will not take or process the information from this office.  [name] [name] verbalized understanding - she will follow up as with above information and let this RN know if further assistance needed.

## 2022-06-06 ENCOUNTER — Other Ambulatory Visit: Payer: Self-pay | Admitting: General Surgery

## 2022-06-06 NOTE — H&P (Signed)
PROVIDER:  Matthias Hughs, MD Patient Care Team: None (Inactive) as PCP - General Donell Beers Daphene Calamity, MD as Consulting Provider (Surgical Oncology) Iruku, Shawn Stall, MD (Hematology and Oncology) Jonna Coup, MD (Radiation Oncology) Arelia Sneddon, Raphael Gibney, MD (Obstetrics and Gynecology) Lise Auer, MD (Internal Medicine)   MRN: [id number] DOB: 06-16-1960     Chief Complaint: 3 month follow up - Right breast       History of Present Illness: [name] [name] is a 62 y.o. female who is seen today for breast cancer follow up.   Initial history:    Pt had a new diagnosis of right breast cancer 02/2021.  She had a palpable mass.  Diagnostic imaging confirmed this and showed a 2.2 cm mass at 9 o'clock.  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  There was an enlarged node, but biopsy of node was benign and concordant.     Of note, she ad issues with h/o saline implants that were removed in 2021.  She had had capsular contractures that Dr. Stephens November addressed by releasing this manually in clinic.  She had them removed to eliminate need for this.  She thinks there was something there at that point in 2021, but it was felt to be scar tissue.  It is definitely more prominent.     Menarche was age 62.  She is a G2P2 with first child at age 10.  She used hormonal contraception for around 8 years.  She is post menopausal since around age 34.     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     Pt received neoadjuvant chemotherapy.  Overall patient tolerated chemo relatively well.  She stated that the main issue for her was the shot to stimulate her bone marrow makes her feel weak and dizzy.  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  She was able to appreciate that the tumor shrunk and disappeared after the second dose of chemotherapy.  She did have sloughing of her skin especially on her face.  Her  skin recovered.     She had follow up MRI which showed a significant improvement.     She underwent right seed bracketed lumpectomy and sentinel node biopsy 09/08/2021.  She had complete pathologic response.  She saw Dr. Al Pimple 09/28/2021 was seen to have drainage from her incision so was sent over.  She was sore for several days after the block wore off.  She was feeling better and doing her arm exercises easily.  She started having dramatic swelling Friday evening 9/1.  She thinks she "overdid it."  She was also having pain going down her arm.      She came back 9/8 with some swelling, but much less than before.  She denied fever/chills.  Culture from aspiration showed klesiella oxytoca.     On 10/05/2921, she returned for a pre radiation check.     Interval history:    She completed adjuvant XRT.  She finished PT.  She still has some right breast soreness and swelling.  She also has some numbness of right chest wall below the axilla.  Her cording is better.  Her hair is growing back.  She does complain of bilateral thumb pain.     Review of Systems: A complete review of systems was obtained from the patient.  I have reviewed this information and discussed as appropriate with the patient.  See HPI as well for other ROS.   Review of Systems  All other systems reviewed and are negative.     Medical History: Past Medical History      Past Medical History:  Diagnosis Date   History of cancer          Problem List     Patient Active Problem List  Diagnosis   Malignant neoplasm of upper-outer quadrant of right breast in female, estrogen receptor positive    Hot flash, menopausal   Klebsiella infection   Drug-related hair loss        Past Surgical History       Past Surgical History:  Procedure Laterality Date   INSERTION PORT-A-CATH    04/06/2021    Dr. Donell Beers   RIGHT BREAST SEED BRACKETED LUMPECTOMY AND SENTINEL LYMPH NODE BIOPSY    09/08/2021    Dr. Worthy Flank   breast implants         2002   CHOLECYSTECTOMY N/A      1993   JOINT REPLACEMENT            Allergies      Allergies  Allergen Reactions   Venom-Honey Bee Swelling        Medications Ordered Prior to Encounter        Current Outpatient Medications on File Prior to Visit  Medication Sig Dispense Refill   letrozole (FEMARA) 2.5 mg tablet Take by mouth       cholecalciferol (VITAMIN D3) 5,000 unit capsule Take by mouth       doxycycline (VIBRA-TABS) 100 MG tablet Take 100 mg by mouth 2 (two) times daily (Patient not taking: Reported on 01/19/2022)       multivitamin with minerals tablet Take 1 tablet by mouth once daily       venlafaxine (EFFEXOR-XR) 37.5 MG XR capsule Take 1 capsule (37.5 mg total) by mouth once daily (Patient not taking: Reported on 10/01/2021) 30 capsule 11   ZINC ACETATE ORAL zinc        No current facility-administered medications on file prior to visit.        Family History       Family History  Problem Relation Age of Onset   Stroke Mother          Tobacco Use History  Social History       Tobacco Use  Smoking Status Never  Smokeless Tobacco Never        Social History  Social History         Socioeconomic History   Marital status: Married  Tobacco Use   Smoking status: Never   Smokeless tobacco: Never  Vaping Use   Vaping Use: Never used  Substance and Sexual Activity   Alcohol use: Yes      Comment: "10 glasses per week"   Drug use: Not Currently        Objective:         Vitals:      PainSc: 0-No pain    There is no height or weight on file to calculate BMI.   Head:   Normocephalic and atraumatic.  Eyes:    Conjunctivae are normal. Pupils are equal, round, and reactive to light. No scleral icterus.  Neck:   Normal range of motion. Neck supple. No tracheal deviation present. No thyromegaly present.  Resp:No respiratory distress, normal effort. Breast: faint right breast lymphedema inferolaterally.  No masses.  Some swelling upper  right breast.  ? Seroma.  No erythema.  No nipple retraction, no nipple discharge.  Good ROM right arm.  Left breast benign.   Abd:      Abdomen is soft, non distended and non tender. No masses are palpable.  There is no rebound and no guarding.  Neurological: Alert and oriented to person, place, and time. Coordination normal.  Skin:    Skin is warm and dry. No rash noted. No diaphoretic. No erythema. No pallor. Recent spray tan.   Psychiatric: Normal mood and affect. Normal behavior. Judgment and thought content normal.      Labs, Imaging and Diagnostic Testing:   N/a     Assessment and Plan:     Diagnoses and all orders for this visit:   Malignant neoplasm of upper-outer quadrant of right breast in female, estrogen receptor positive    No clinical evidence of disease. Will need to complete adjuvant anti her2 treatment.   She started letrozole.

## 2022-06-06 NOTE — Anesthesia Preprocedure Evaluation (Signed)
Anesthesia Evaluation  Patient identified by MRN, date of birth, ID band Patient awake    Reviewed: Allergy & Precautions, NPO status , Patient's Chart, lab work & pertinent test results  History of Anesthesia Complications (+) PONV and history of anesthetic complications  Airway Mallampati: II  TM Distance: >3 FB Neck ROM: Full    Dental  (+) Dental Advisory Given   Pulmonary neg pulmonary ROS   Pulmonary exam normal breath sounds clear to auscultation       Cardiovascular (-) hypertension(-) angina (-) Past MI, (-) Cardiac Stents and (-) CABG + dysrhythmias Atrial Fibrillation  Rhythm:Regular Rate:Normal  TTE 02/09/2022: IMPRESSIONS     1. Left ventricular ejection fraction, by estimation, is 65 to 70%. The  left ventricle has normal function. The left ventricle has no regional  wall motion abnormalities. Left ventricular diastolic parameters are  consistent with Grade I diastolic  dysfunction (impaired relaxation). The average left ventricular global  longitudinal strain is -21.7 %. The global longitudinal strain is normal.   2. Right ventricular systolic function is normal. The right ventricular  size is normal. There is normal pulmonary artery systolic pressure.   3. The mitral valve is normal in structure. No evidence of mitral valve  regurgitation. No evidence of mitral stenosis.   4. The aortic valve is normal in structure. Aortic valve regurgitation is  not visualized. No aortic stenosis is present.   5. The inferior vena cava is normal in size with greater than 50%  respiratory variability, suggesting right atrial pressure of 3 mmHg.     Neuro/Psych negative neurological ROS     GI/Hepatic negative GI ROS, Neg liver ROS,,,  Endo/Other  negative endocrine ROS    Renal/GU negative Renal ROS     Musculoskeletal   Abdominal   Peds  Hematology negative hematology ROS (+)   Anesthesia Other  Findings right breast cancer  Reproductive/Obstetrics                             Anesthesia Physical Anesthesia Plan  ASA: 2  Anesthesia Plan: MAC   Post-op Pain Management:    Induction: Intravenous  PONV Risk Score and Plan: 3 and Propofol infusion and Treatment may vary due to age or medical condition  Airway Management Planned: Natural Airway and Simple Face Mask  Additional Equipment:   Intra-op Plan:   Post-operative Plan: Extubation in OR  Informed Consent: I have reviewed the patients History and Physical, chart, labs and discussed the procedure including the risks, benefits and alternatives for the proposed anesthesia with the patient or authorized representative who has indicated his/her understanding and acceptance.     Dental advisory given  Plan Discussed with: CRNA and Anesthesiologist  Anesthesia Plan Comments: (Discussed with patient risks of MAC including, but not limited to, minor pain or discomfort, hearing people in the room, and possible need for backup general anesthesia. Risks for general anesthesia also discussed including, but not limited to, sore throat, hoarse voice, chipped/damaged teeth, injury to vocal cords, nausea and vomiting, allergic reactions, lung infection, heart attack, stroke, and death. All questions answered. )       Anesthesia Quick Evaluation

## 2022-06-07 ENCOUNTER — Encounter (HOSPITAL_BASED_OUTPATIENT_CLINIC_OR_DEPARTMENT_OTHER): Payer: Self-pay | Admitting: General Surgery

## 2022-06-07 ENCOUNTER — Other Ambulatory Visit: Payer: Self-pay

## 2022-06-07 ENCOUNTER — Encounter (HOSPITAL_BASED_OUTPATIENT_CLINIC_OR_DEPARTMENT_OTHER)
Admission: RE | Disposition: A | Discharge status: Home / Self Care | Payer: Self-pay | Source: Home / Self Care | Attending: General Surgery

## 2022-06-07 ENCOUNTER — Ambulatory Visit (HOSPITAL_BASED_OUTPATIENT_CLINIC_OR_DEPARTMENT_OTHER): Payer: 59 | Admitting: Anesthesiology

## 2022-06-07 ENCOUNTER — Ambulatory Visit (HOSPITAL_BASED_OUTPATIENT_CLINIC_OR_DEPARTMENT_OTHER)
Admission: RE | Admit: 2022-06-07 | Discharge: 2022-06-07 | Disposition: A | Discharge status: Home / Self Care | Payer: 59 | Attending: General Surgery | Admitting: General Surgery

## 2022-06-07 DIAGNOSIS — Z452 Encounter for adjustment and management of vascular access device: Secondary | ICD-10-CM

## 2022-06-07 DIAGNOSIS — Z853 Personal history of malignant neoplasm of breast: Secondary | ICD-10-CM

## 2022-06-07 DIAGNOSIS — Z79899 Other long term (current) drug therapy: Secondary | ICD-10-CM | POA: Diagnosis not present

## 2022-06-07 DIAGNOSIS — C50411 Malignant neoplasm of upper-outer quadrant of right female breast: Secondary | ICD-10-CM | POA: Diagnosis present

## 2022-06-07 DIAGNOSIS — Z08 Encounter for follow-up examination after completed treatment for malignant neoplasm: Secondary | ICD-10-CM | POA: Diagnosis not present

## 2022-06-07 DIAGNOSIS — I4891 Unspecified atrial fibrillation: Secondary | ICD-10-CM

## 2022-06-07 DIAGNOSIS — Z01818 Encounter for other preprocedural examination: Secondary | ICD-10-CM

## 2022-06-07 DIAGNOSIS — Z923 Personal history of irradiation: Secondary | ICD-10-CM | POA: Diagnosis not present

## 2022-06-07 DIAGNOSIS — Z9221 Personal history of antineoplastic chemotherapy: Secondary | ICD-10-CM | POA: Insufficient documentation

## 2022-06-07 HISTORY — PX: PORT-A-CATH REMOVAL: SHX5289

## 2022-06-07 SURGERY — REMOVAL PORT-A-CATH
Anesthesia: Monitor Anesthesia Care | Site: Chest | Laterality: Left

## 2022-06-07 MED ORDER — ONDANSETRON HCL 4 MG/2ML IJ SOLN
INTRAMUSCULAR | Status: AC
  Filled 2022-06-07: qty 2

## 2022-06-07 MED ORDER — MIDAZOLAM HCL 5 MG/5ML IJ SOLN
INTRAMUSCULAR | Status: DC | PRN
  Administered 2022-06-07: 2 mg via INTRAVENOUS

## 2022-06-07 MED ORDER — ACETAMINOPHEN 500 MG PO TABS
ORAL_TABLET | ORAL | Status: AC
  Filled 2022-06-07: qty 2

## 2022-06-07 MED ORDER — FENTANYL CITRATE (PF) 100 MCG/2ML IJ SOLN: 25.0000 ug | INTRAMUSCULAR | Status: DC | PRN

## 2022-06-07 MED ORDER — ONDANSETRON HCL 4 MG/2ML IJ SOLN
INTRAMUSCULAR | Status: DC | PRN
  Administered 2022-06-07: 4 mg via INTRAVENOUS

## 2022-06-07 MED ORDER — AMISULPRIDE (ANTIEMETIC) 5 MG/2ML IV SOLN: 10.0000 mg | Freq: Once | INTRAVENOUS | Status: DC | PRN

## 2022-06-07 MED ORDER — BUPIVACAINE HCL (PF) 0.25 % IJ SOLN
INTRAMUSCULAR | Status: DC | PRN
  Administered 2022-06-07: 10 mL

## 2022-06-07 MED ORDER — LIDOCAINE-EPINEPHRINE (PF) 1 %-1:200000 IJ SOLN
INTRAMUSCULAR | Status: AC
  Filled 2022-06-07: qty 60

## 2022-06-07 MED ORDER — LIDOCAINE 2% (20 MG/ML) 5 ML SYRINGE
INTRAMUSCULAR | Status: AC
  Filled 2022-06-07: qty 5

## 2022-06-07 MED ORDER — OXYCODONE HCL 5 MG PO TABS: 5.0000 mg | ORAL_TABLET | Freq: Once | ORAL | Status: DC | PRN

## 2022-06-07 MED ORDER — CHLORHEXIDINE GLUCONATE CLOTH 2 % EX PADS: 6.0000 | MEDICATED_PAD | Freq: Once | CUTANEOUS | Status: DC

## 2022-06-07 MED ORDER — CEFAZOLIN SODIUM-DEXTROSE 2-4 GM/100ML-% IV SOLN
INTRAVENOUS | Status: AC
  Filled 2022-06-07: qty 100

## 2022-06-07 MED ORDER — CEFAZOLIN SODIUM-DEXTROSE 2-4 GM/100ML-% IV SOLN
2.0000 g | INTRAVENOUS | Status: AC
  Administered 2022-06-07: 2 g via INTRAVENOUS

## 2022-06-07 MED ORDER — OXYCODONE HCL 5 MG PO TABS: 5.0000 mg | ORAL_TABLET | Freq: Four times a day (QID) | ORAL | 0 refills | Status: AC | PRN

## 2022-06-07 MED ORDER — ACETAMINOPHEN 500 MG PO TABS
1000.0000 mg | ORAL_TABLET | ORAL | Status: AC
  Administered 2022-06-07: 1000 mg via ORAL

## 2022-06-07 MED ORDER — OXYCODONE HCL 5 MG/5ML PO SOLN: 5.0000 mg | Freq: Once | ORAL | Status: DC | PRN

## 2022-06-07 MED ORDER — PROPOFOL 500 MG/50ML IV EMUL
INTRAVENOUS | Status: AC
  Filled 2022-06-07: qty 100

## 2022-06-07 MED ORDER — FENTANYL CITRATE (PF) 100 MCG/2ML IJ SOLN
INTRAMUSCULAR | Status: AC
  Filled 2022-06-07: qty 2

## 2022-06-07 MED ORDER — MIDAZOLAM HCL 2 MG/2ML IJ SOLN
INTRAMUSCULAR | Status: AC
  Filled 2022-06-07: qty 2

## 2022-06-07 MED ORDER — BUPIVACAINE HCL (PF) 0.25 % IJ SOLN
INTRAMUSCULAR | Status: AC
  Filled 2022-06-07: qty 30

## 2022-06-07 MED ORDER — PROPOFOL 500 MG/50ML IV EMUL
INTRAVENOUS | Status: DC | PRN
  Administered 2022-06-07: 125 ug/kg/min via INTRAVENOUS

## 2022-06-07 MED ORDER — LIDOCAINE-EPINEPHRINE (PF) 1 %-1:200000 IJ SOLN
INTRAMUSCULAR | Status: DC | PRN
  Administered 2022-06-07: 10 mL

## 2022-06-07 MED ORDER — FENTANYL CITRATE (PF) 100 MCG/2ML IJ SOLN
INTRAMUSCULAR | Status: DC | PRN
  Administered 2022-06-07 (×2): 50 ug via INTRAVENOUS

## 2022-06-07 MED ORDER — LACTATED RINGERS IV SOLN: INTRAVENOUS | Status: DC

## 2022-06-07 SURGICAL SUPPLY — 32 items
ADH SKN CLS APL DERMABOND .7 (GAUZE/BANDAGES/DRESSINGS) ×1
APL PRP STRL LF DISP 70% ISPRP (MISCELLANEOUS) ×1
BLADE HEX COATED 2.75 (ELECTRODE) ×2 IMPLANT
BLADE SURG 15 STRL LF DISP TIS (BLADE) ×2 IMPLANT
BLADE SURG 15 STRL SS (BLADE) ×1
CANISTER SUCT 1200ML W/VALVE (MISCELLANEOUS) IMPLANT
CHLORAPREP W/TINT 26 (MISCELLANEOUS) ×2 IMPLANT
COVER BACK TABLE 60X90IN (DRAPES) ×2 IMPLANT
COVER MAYO STAND STRL (DRAPES) ×2 IMPLANT
DERMABOND ADVANCED .7 DNX12 (GAUZE/BANDAGES/DRESSINGS) ×2 IMPLANT
DRAPE LAPAROTOMY 100X72 PEDS (DRAPES) ×2 IMPLANT
DRAPE UTILITY XL STRL (DRAPES) ×2 IMPLANT
ELECT REM PT RETURN 9FT ADLT (ELECTROSURGICAL) ×1
ELECTRODE REM PT RTRN 9FT ADLT (ELECTROSURGICAL) ×2 IMPLANT
GLOVE BIO SURGEON STRL SZ 6 (GLOVE) ×2 IMPLANT
GLOVE BIOGEL PI IND STRL 6.5 (GLOVE) ×2 IMPLANT
GOWN STRL REUS W/ TWL LRG LVL3 (GOWN DISPOSABLE) ×2 IMPLANT
GOWN STRL REUS W/TWL 2XL LVL3 (GOWN DISPOSABLE) ×2 IMPLANT
GOWN STRL REUS W/TWL LRG LVL3 (GOWN DISPOSABLE) ×1
NDL HYPO 25X1 1.5 SAFETY (NEEDLE) ×2 IMPLANT
NEEDLE HYPO 25X1 1.5 SAFETY (NEEDLE) ×1 IMPLANT
NS IRRIG 1000ML POUR BTL (IV SOLUTION) IMPLANT
PACK BASIN DAY SURGERY FS (CUSTOM PROCEDURE TRAY) ×2 IMPLANT
PENCIL SMOKE EVACUATOR (MISCELLANEOUS) ×2 IMPLANT
SPIKE FLUID TRANSFER (MISCELLANEOUS) IMPLANT
SUT MNCRL AB 4-0 PS2 18 (SUTURE) ×2 IMPLANT
SUT VIC AB 3-0 SH 27 (SUTURE) ×1
SUT VIC AB 3-0 SH 27X BRD (SUTURE) ×2 IMPLANT
SYR CONTROL 10ML LL (SYRINGE) ×2 IMPLANT
TOWEL GREEN STERILE FF (TOWEL DISPOSABLE) ×2 IMPLANT
TUBE CONNECTING 20X1/4 (TUBING) IMPLANT
YANKAUER SUCT BULB TIP NO VENT (SUCTIONS) IMPLANT

## 2022-06-07 NOTE — Anesthesia Postprocedure Evaluation (Signed)
Anesthesia Post Note  Patient: [name] [name]  Procedure(s) Performed: REMOVAL PORT-A-CATH (Left: Chest)     Patient location during evaluation: PACU Anesthesia Type: MAC Level of consciousness: awake Pain management: pain level controlled Vital Signs Assessment: post-procedure vital signs reviewed and stable Respiratory status: spontaneous breathing, nonlabored ventilation and respiratory function stable Cardiovascular status: stable and blood pressure returned to baseline Postop Assessment: no apparent nausea or vomiting Anesthetic complications: no   No notable events documented.  Last Vitals:  Vitals:   06/07/22 0835 06/07/22 0843  BP:  132/77  Pulse: 69 66  Resp: 10 18  Temp:  36.6 C  SpO2: 93% 97%    Last Pain:  Vitals:   06/07/22 0843  TempSrc:   PainSc: 0-No pain                 [name]

## 2022-06-07 NOTE — Interval H&P Note (Signed)
History and Physical Interval Note:  06/07/2022 7:33 AM  [name] [name]  has presented today for surgery, with the diagnosis of PORT IN PLACE.  The various methods of treatment have been discussed with the patient and family. After consideration of risks, benefits and other options for treatment, the patient has consented to  Procedure(s): REMOVAL PORT-A-CATH (N/A) as a surgical intervention.  [name] history has been reviewed, patient examined, no change in status, stable for surgery.  I have reviewed the patient's chart and labs.  Questions were answered to the patient's satisfaction.     Almond Lint

## 2022-06-07 NOTE — Transfer of Care (Signed)
Immediate Anesthesia Transfer of Care Note  Patient: [name] [name]  Procedure(s) Performed: REMOVAL PORT-A-CATH (Left: Chest)  Patient Location: PACU  Anesthesia Type:MAC  Level of Consciousness: awake, alert , and oriented  Airway & Oxygen Therapy: Patient Spontanous Breathing and Patient connected to face mask oxygen  Post-op Assessment: Report given to RN and Post -op Vital signs reviewed and stable  Post vital signs: Reviewed and stable  Last Vitals:  Vitals Value Taken Time  BP 113/65 06/07/22 0819  Temp    Pulse 74 06/07/22 0821  Resp 14 06/07/22 0821  SpO2 98 % 06/07/22 0821  Vitals shown include unvalidated device data.  Last Pain:  Vitals:   06/07/22 0636  TempSrc: Temporal  PainSc: 0-No pain      Patients Stated Pain Goal: 3 (06/07/22 0636)  Complications: No notable events documented.

## 2022-06-07 NOTE — Op Note (Signed)
  PRE-OPERATIVE DIAGNOSIS:  un-needed Port-A-Cath for right breast cancer  POST-OPERATIVE DIAGNOSIS:  Same   PROCEDURE:  Procedure(s):  REMOVAL PORT-A-CATH left subclavian position  SURGEON:  Surgeon(s):  Almond Lint, MD  ANESTHESIA:   MAC + local  EBL:   Minimal  SPECIMEN:  None  Complications : none known  Procedure:   Pt was  identified in the holding area and taken to the operating room where she was placed supine on the operating room table.  MAC anesthesia was induced.  The left upper chest was prepped and draped.  The prior incision was anesthetized with local anesthetic.  The incision was opened with a #15 blade.  The subcutaneous tissue was divided with the cautery.  The port was identified and the capsule opened.  The four 2-0 prolene sutures were removed.  The port was then removed and pressure held on the tract.  The catheter appeared intact without evidence of breakage, length was 23 cm.  The wound was inspected for hemostasis, which was achieved with cautery.  The wound was closed with 3-0 vicryl deep dermal interrupted sutures and 4-0 Monocryl running subcuticular suture.  The wound was cleaned, dried, and dressed with dermabond.  The patient was awakened from anesthesia and taken to the PACU in stable condition.  Needle, sponge, and instrument counts are correct.

## 2022-06-07 NOTE — Discharge Instructions (Addendum)
[facility] Office Phone Number [phone]   POST OP INSTRUCTIONS  Always review your discharge instruction sheet given to you by the facility where your surgery was performed.  IF YOU HAVE DISABILITY OR FAMILY LEAVE FORMS, YOU MUST BRING THEM TO THE OFFICE FOR PROCESSING.  DO NOT GIVE THEM TO YOUR DOCTOR.  Take 2 tylenol (acetominophen) three times a day for 3 days.  If you still have pain, add ibuprofen with food in between if able to take this (if you have kidney issues or stomach issues, do not take ibuprofen).  If both of those are not enough, add the narcotic pain pill.  If you find you are needing a lot of this overnight after surgery, call the next morning for a refill.   Take your usually prescribed medications unless otherwise directed If you need a refill on your pain medication, please contact your pharmacy.  They will contact our office to request authorization.  Prescriptions will not be filled after 5pm or on week-ends. You should eat very light the first 24 hours after surgery, such as soup, crackers, pudding, etc.  Resume your normal diet the day after surgery It is common to experience some constipation if taking pain medication after surgery.  Increasing fluid intake and taking a stool softener will usually help or prevent this problem from occurring.  A mild laxative (Milk of Magnesia or Miralax) should be taken according to package directions if there are no bowel movements after 48 hours. You may shower in 48 hours.  The surgical glue will flake off in 2-3 weeks.   ACTIVITIES:  No strenuous activity or heavy lifting for 1 week.   You may drive when you no longer are taking prescription pain medication, you can comfortably wear a seatbelt, and you can safely maneuver your car and apply brakes. RETURN TO WORK:  __________1 week_______________ [name] [name] should see your doctor in the office for a follow-up appointment approximately three-four weeks after your surgery.     WHEN TO CALL YOUR DOCTOR: Fever over 101.0 Nausea and/or vomiting. Extreme swelling or bruising. Continued bleeding from incision. Increased pain, redness, or drainage from the incision.  The clinic staff is available to answer your questions during regular business hours.  Please don't hesitate to call and ask to speak to one of the nurses for clinical concerns.  If you have a medical emergency, go to the nearest emergency room or call 911.  A surgeon from [facility] is always on call at the hospital.  For further questions, please visit [email or web address]    Post Anesthesia Home Care Instructions  Activity: Get plenty of rest for the remainder of the day. A responsible individual must stay with you for 24 hours following the procedure.  For the next 24 hours, DO NOT: -Drive a car -Advertising copywriter -Drink alcoholic beverages -Take any medication unless instructed by your physician -Make any legal decisions or sign important papers.  Meals: Start with liquid foods such as gelatin or soup. Progress to regular foods as tolerated. Avoid greasy, spicy, heavy foods. If nausea and/or vomiting occur, drink only clear liquids until the nausea and/or vomiting subsides. Call your physician if vomiting continues.  Special Instructions/Symptoms: Your throat may feel dry or sore from the anesthesia or the breathing tube placed in your throat during surgery. If this causes discomfort, gargle with warm salt water. The discomfort should disappear within 24 hours.  If you had a scopolamine patch placed behind your ear for the management  of post- operative nausea and/or vomiting:  1. The medication in the patch is effective for 72 hours, after which it should be removed.  Wrap patch in a tissue and discard in the trash. Wash hands thoroughly with soap and water. 2. You may remove the patch earlier than 72 hours if you experience unpleasant side effects which may include dry  mouth, dizziness or visual disturbances. 3. Avoid touching the patch. Wash your hands with soap and water after contact with the patch.    *May have Tylenol today at 12:40pm

## 2022-06-08 ENCOUNTER — Encounter (HOSPITAL_BASED_OUTPATIENT_CLINIC_OR_DEPARTMENT_OTHER): Payer: Self-pay | Admitting: General Surgery

## 2022-07-11 ENCOUNTER — Other Ambulatory Visit: Payer: Self-pay | Admitting: *Deleted

## 2022-07-11 ENCOUNTER — Ambulatory Visit: Payer: 59 | Attending: Radiation Oncology | Admitting: Physical Therapy

## 2022-07-11 ENCOUNTER — Telehealth: Payer: Self-pay | Admitting: *Deleted

## 2022-07-11 DIAGNOSIS — Z483 Aftercare following surgery for neoplasm: Secondary | ICD-10-CM | POA: Insufficient documentation

## 2022-07-11 DIAGNOSIS — Z170 Estrogen receptor positive status [ER+]: Secondary | ICD-10-CM

## 2022-07-11 NOTE — Telephone Encounter (Signed)
Pt left VM stating she is having a " joint pain that gets really bad at night - that my body felt frozen and couldn't move", " I do not have a grip "  She is asking if the letrozole could be changed to another medication.  This RN returned call and obtained her identified VM- message left regarding changing medications - stating concern regarding changing to another AI may induce the same SE - with other choice as tamoxifen which we would need to discuss further.  This RN instructed pt if she wants to change medication she needs to stop the letrozole presently and call back post 1 week with update on symptoms and further discussion.

## 2022-07-11 NOTE — Therapy (Signed)
OUTPATIENT PHYSICAL THERAPY SOZO SCREENING NOTE   Patient Name: [name] [name] MRN: [id number] DOB:Aug 23, 1960, 62 y.o., female Today's Date: 07/11/2022  PCP: Jerral Ralph, MD REFERRING PROVIDER: Ronny Bacon,*   PT End of Session - 07/11/22 1042     Visit Number 8    PT Start Time 1041    PT Stop Time 1050    PT Time Calculation (min) 9 min    Activity Tolerance Patient tolerated treatment well    Behavior During Therapy [facility] for tasks assessed/performed             Past Medical History:  Diagnosis Date   Abnormal bleeding in menstrual cycle    Abnormal Pap smear of vagina    Breast cancer (HCC)    right breast   COVID 08/2019   Endometrial polyp    Hot flashes    Irregular heart beats    PONV (postoperative nausea and vomiting)    Typical atrial flutter (HCC) 01/01/2019   Past Surgical History:  Procedure Laterality Date   BREAST BIOPSY Right 04/05/2021   BREAST IMPLANT REMOVAL Bilateral 2021   breast implants Bilateral 2002   BREAST LUMPECTOMY WITH RADIOACTIVE SEED AND SENTINEL LYMPH NODE BIOPSY Right 09/08/2021   Procedure: RIGHT BREAST SEED BRACKETED LUMPECTOMY AND SENTINEL LYMPH NODE BIOPSY;  Surgeon: Almond Lint, MD;  Location: [facility] OR;  Service: General;  Laterality: Right;  GEN & PEC BLOCK   CHOLECYSTECTOMY  1993   PORT-A-CATH REMOVAL Left 06/07/2022   Procedure: REMOVAL PORT-A-CATH;  Surgeon: Almond Lint, MD;  Location: [facility];  Service: General;  Laterality: Left;   PORTACATH PLACEMENT N/A 04/06/2021   Procedure: INSERTION PORT-A-CATH;  Surgeon: Almond Lint, MD;  Location: [facility] OR;  Service: General;  Laterality: N/A;   SHOULDER SURGERY Right    Rotator cuff repair   Patient Active Problem List   Diagnosis Date Noted   Dysuria 06/08/2021   Port-A-Cath in place 05/18/2021   Chemotherapy induced diarrhea 04/27/2021   Rash and nonspecific skin eruption 04/27/2021   UTI (urinary tract infection) 04/27/2021   Bone pain  due to granulocyte colony stimulating factor 04/27/2021   Transaminitis 04/27/2021   Genetic testing 03/31/2021   Malignant neoplasm of upper-outer quadrant of right breast in female, estrogen receptor positive (HCC) 03/16/2021   History of cervical dysplasia 02/17/2021   Hot flashes due to menopause 09/16/2019   Typical atrial flutter (HCC) 01/01/2019    REFERRING DIAG: right breast cancer at risk for lymphedema  THERAPY DIAG:  Aftercare following surgery for neoplasm  PERTINENT HISTORY: Patient was diagnosed on 03/04/2021 with right grade II invasive ductal carcinoma breast cancer. It measures 2.2 cm and is located in the upper outer quadrant. It is triple positive with a Ki67 of 25%.  She has a history of breast implants and had a right rotator cuff repair in 2019. Neoadjuvant chemotherapy. Rt lumpectomy and SLNB on 09/08/21 with 5 negative nodes removed.  Drainage noted from incision 09/28/21 with infection. Will be having radiation.   PRECAUTIONS: right UE Lymphedema risk  SUBJECTIVE: Here for SOZO screen  PAIN:  Are you having pain? No  SOZO SCREENING: Patient was assessed today using the SOZO machine to determine the lymphedema index score. This was compared to her baseline score. It was determined that she is within the recommended range when compared to her baseline and no further action is needed at this time. She will continue SOZO screenings. These are done every 3 months for 2 years  post operatively followed by every 6 months for 2 years, and then annually.   L-DEX FLOWSHEETS - 07/11/22 1000       L-DEX LYMPHEDEMA SCREENING   Measurement Type Unilateral    L-DEX MEASUREMENT EXTREMITY Upper Extremity    POSITION  Standing    DOMINANT SIDE Right    At Risk Side Right    BASELINE SCORE (UNILATERAL) -0.9    L-DEX SCORE (UNILATERAL) -2.5    VALUE CHANGE (UNILAT) -1.6             [name], [place] 07/11/22 10:47 AM

## 2022-07-19 ENCOUNTER — Ambulatory Visit
Admission: RE | Admit: 2022-07-19 | Discharge: 2022-07-19 | Disposition: A | Discharge status: Home / Self Care | Payer: 59 | Source: Ambulatory Visit | Attending: Adult Health | Admitting: Adult Health

## 2022-07-19 DIAGNOSIS — C50411 Malignant neoplasm of upper-outer quadrant of right female breast: Secondary | ICD-10-CM

## 2022-07-19 HISTORY — DX: Personal history of antineoplastic chemotherapy: Z92.21

## 2022-07-19 HISTORY — DX: Personal history of irradiation: Z92.3

## 2022-08-09 ENCOUNTER — Telehealth: Payer: Self-pay | Admitting: *Deleted

## 2022-08-09 ENCOUNTER — Other Ambulatory Visit: Payer: Self-pay | Admitting: *Deleted

## 2022-08-09 MED ORDER — EXEMESTANE 25 MG PO TABS: 25.0000 mg | ORAL_TABLET | Freq: Every day | ORAL | 3 refills | Status: DC

## 2022-08-09 NOTE — Telephone Encounter (Signed)
This RN spoke with pt per her return call regarding need for a different medication then the letrozole due to severe joint discomfort interfering with ADL's.  She has been off approximately 3 weeks with complete resolution of symptoms.  Per MD order given for exemestane.  Prescription sent to verified pharmacy.

## 2022-09-03 ENCOUNTER — Encounter (HOSPITAL_BASED_OUTPATIENT_CLINIC_OR_DEPARTMENT_OTHER): Payer: Self-pay | Admitting: Emergency Medicine

## 2022-09-03 ENCOUNTER — Other Ambulatory Visit: Payer: Self-pay

## 2022-09-03 ENCOUNTER — Emergency Department (HOSPITAL_BASED_OUTPATIENT_CLINIC_OR_DEPARTMENT_OTHER)
Admission: EM | Admit: 2022-09-03 | Discharge: 2022-09-03 | Disposition: A | Discharge status: Home / Self Care | Payer: 59 | Source: Home / Self Care | Attending: Emergency Medicine | Admitting: Emergency Medicine

## 2022-09-03 DIAGNOSIS — T7840XA Allergy, unspecified, initial encounter: Secondary | ICD-10-CM | POA: Insufficient documentation

## 2022-09-03 MED ORDER — LORATADINE 10 MG PO TABS
10.0000 mg | ORAL_TABLET | Freq: Once | ORAL | Status: AC
  Administered 2022-09-03: 10 mg via ORAL
  Filled 2022-09-03: qty 1

## 2022-09-03 MED ORDER — METHYLPREDNISOLONE SODIUM SUCC 125 MG IJ SOLR
125.0000 mg | Freq: Once | INTRAMUSCULAR | Status: AC
  Administered 2022-09-03: 125 mg via INTRAVENOUS
  Filled 2022-09-03: qty 2

## 2022-09-03 MED ORDER — EPINEPHRINE 0.3 MG/0.3ML IJ SOAJ: 0.3000 mg | INTRAMUSCULAR | 0 refills | Status: AC | PRN

## 2022-09-03 MED ORDER — FAMOTIDINE 20 MG PO TABS
40.0000 mg | ORAL_TABLET | Freq: Once | ORAL | Status: AC
  Administered 2022-09-03: 40 mg via ORAL
  Filled 2022-09-03: qty 2

## 2022-09-03 MED ORDER — PREDNISONE 10 MG PO TABS: 40.0000 mg | ORAL_TABLET | Freq: Every day | ORAL | 0 refills | Status: AC

## 2022-09-03 NOTE — ED Notes (Signed)
No hives noted; BLE swelling decreased; pt sts she feels much better

## 2022-09-03 NOTE — ED Provider Notes (Signed)
[facility] Provider Note   CSN: [id number] Arrival date & time: 09/03/22  1935     History Chief Complaint  Patient presents with   Allergic Reaction    [name] [name] is a 62 y.o. female.  Patient presents to the emergency department concerns of an allergic reaction.  Reports that she stepped on an ant hill earlier today and was subsequently bitten by multiple aunts.  Unsure what type of dance the patient denies any past history of allergy to ant bites.  Has tried taking Benadryl at home without significant improvement in her symptoms.  Reports that she had some swelling, itching and warmth to the bilateral lower extremities.  Denies any wheezing, chest pain, cough, sore throat, dysphagia.  No prior history of anaphylactic allergies and does not have an EpiPen for severe allergies.   Allergic Reaction      Home Medications Prior to Admission medications   Medication Sig Start Date End Date Taking? Authorizing Provider  EPINEPHrine 0.3 mg/0.3 mL IJ SOAJ injection Inject 0.3 mg into the muscle as needed for anaphylaxis. 09/03/22  Yes Smitty Knudsen, PA-C  predniSONE (DELTASONE) 10 MG tablet Take 4 tablets (40 mg total) by mouth daily for 5 days. 09/03/22 09/08/22 Yes Smitty Knudsen, PA-C  acetaminophen (TYLENOL) 500 MG tablet Take 1,000 mg by mouth every 6 (six) hours as needed for moderate pain.    [provider]  Cetirizine HCl (ZYRTEC ALLERGY PO) Take by mouth.    [provider]  Cholecalciferol (VITAMIN D3) 125 MCG (5000 UT) CAPS Take 10,000 Units by mouth daily.    [provider]  exemestane (AROMASIN) 25 MG tablet Take 1 tablet (25 mg total) by mouth daily after breakfast. 08/09/22   Rachel Moulds, MD  lidocaine-prilocaine (EMLA) cream Apply 1 Application topically as needed (port access).    [provider]  Mag Oxide-Vit D3-Turmeric (MAGNESIUM-VITAMIN D3-TURMERIC PO) Take by mouth.    [provider]  Multiple Vitamin (MULTIVITAMIN WITH MINERALS) TABS tablet Take 1 tablet by mouth daily.    [provider]  Nutritional Supplements (FRUIT & VEGETABLE DAILY) CAPS Take 1 capsule by mouth daily.    [provider]  oxyCODONE (OXY IR/ROXICODONE) 5 MG immediate release tablet Take 1 tablet (5 mg total) by mouth every 6 (six) hours as needed for severe pain. 06/07/22   Almond Lint, MD  Probiotic Product (PROBIOTIC DAILY PO) Take 1 tablet by mouth daily.     [provider]  Zinc Acetate, Oral, (ZINC ACETATE PO) zinc    [provider]  prochlorperazine (COMPAZINE) 10 MG tablet Take 1 tablet (10 mg total) by mouth every 6 (six) hours as needed (Nausea or vomiting). 03/17/21 08/06/21  Rachel Moulds, MD      Allergies    Bee venom    Review of Systems   Review of Systems  Skin:  Positive for color change.  All other systems reviewed and are negative.   Physical Exam Updated Vital Signs BP (!) 158/91   Pulse 75   Temp 97.9 F (36.6 C) (Oral)   Resp 16   LMP 09/09/2019   SpO2 96%  Physical Exam Vitals and nursing note reviewed.  Constitutional:      General: She is not in acute distress.    Appearance: She is well-developed.  HENT:     Head: Normocephalic and atraumatic.     Mouth/Throat:     Mouth: Mucous membranes are  moist.     Pharynx: Oropharynx is clear. No posterior oropharyngeal erythema.  Eyes:     Conjunctiva/sclera: Conjunctivae normal.  Cardiovascular:     Rate and Rhythm: Normal rate and regular rhythm.     Heart sounds: No murmur heard. Pulmonary:     Effort: Pulmonary effort is normal. No respiratory distress.     Breath sounds: Normal breath sounds. No wheezing.     Comments: No acute respiratory distress at this time. Abdominal:     Palpations: Abdomen is soft.     Tenderness: There is no abdominal tenderness.  Musculoskeletal:        General: Swelling present.     Cervical back: Neck supple.      Comments: Mild swelling to bilateral lower extremities.  Lymphadenopathy:     Cervical: No cervical adenopathy.  Skin:    General: Skin is warm and dry.     Capillary Refill: Capillary refill takes less than 2 seconds.     Findings: Erythema present.     Comments: Erythema and warmth to bilateral lower extremities. No areas of fluctuance of purulence.  Neurological:     Mental Status: She is alert.  Psychiatric:        Mood and Affect: Mood normal.     ED Results / Procedures / Treatments   Labs (all labs ordered are listed, but only abnormal results are displayed) Labs Reviewed - No data to display  EKG None  Radiology No results found.  Procedures Procedures   Medications Ordered in ED Medications  loratadine (CLARITIN) tablet 10 mg (10 mg Oral Given 09/03/22 2013)  famotidine (PEPCID) tablet 40 mg (40 mg Oral Given 09/03/22 2013)  methylPREDNISolone sodium succinate (SOLU-MEDROL) 125 mg/2 mL injection 125 mg (125 mg Intravenous Given 09/03/22 2019)    ED Course/ Medical Decision Making/ A&P                               Medical Decision Making Risk Prescription drug management.   This patient presents to the ED for concern of allergic reaction.  Differential diagnosis includes anaphylaxis, cellulitis, urticaria, DVT   Medicines ordered and prescription drug management:  I ordered medication including Claritin, Pepcid, Solu-Medrol for allergic reaction Reevaluation of the patient after these medicines showed that the patient improved I have reviewed the patients home medicines and have made adjustments as needed   Problem List / ED Course:  Patient presented to the emergency department concerns of an allergic reaction.  Reports that she was bit by ants when she stepped on an annual earlier today.  Has taken 50 mg of Benadryl total and applies hydrocortisone cream to the affected area without significant improvement.  No shortness of breath, wheezing, chest  tightness.  Notes that she did develop some hives but this is largely been improving.  Will try course of H1 and H2 RA I will as well as methylprednisolone for management of symptoms.  Will reassess patient shortly. Patient reports significant improvement with allergic reaction medications including loratadine, famotidine, and methylprednisolone.  No acute signs of respiratory distress, chest pain, lightheadedness, weakness.  Concerned that patient may be has been having worsening reactions to allergic triggers she reports that she is unable to manage her symptoms at home anymore with over-the-counter medications.  Given concerns of worsening reactions, will send a prescription for an EpiPen to patient's pharmacy for use at home if needed for signs of anaphylaxis.  Discussed  and educated patient on how to use an EpiPen.  Will also continue outpatient steroid course for the next few days with prednisone 40 mg.  Also advised patient to follow-up with primary care provider for further evaluation of symptoms.  Encourage patient return to the emergency department if she has any acute decline in symptoms or has recurrence of her current reaction.  No recurrence of hives and skin irritation significantly improved.  Will plan on discharging patient home with outpatient follow-up with primary care provider.  Patient is agreeable to treatment plan and verbalized understanding all return precautions.  All questions answered prior to patient discharge.  Final Clinical Impression(s) / ED Diagnoses Final diagnoses:  Allergic reaction, initial encounter    Rx / DC Orders ED Discharge Orders          Ordered    EPINEPHrine 0.3 mg/0.3 mL IJ SOAJ injection  As needed        09/03/22 2134    predniSONE (DELTASONE) 10 MG tablet  Daily        09/03/22 2134              Smitty Knudsen, PA-C 09/03/22 2354    Charlynne Pander, MD 09/04/22 1451

## 2022-09-03 NOTE — Discharge Instructions (Addendum)
You were seen in the emergency department today for an allergic reaction.  You had improvement in your symptoms with the antihistamine medications and the steroid they received.  Thankfully you had no acute worsening or decline in your symptoms.  I have sent a prescription for an EpiPen and a short course of steroids to take at home.  If you have any recurrence of your symptoms or begin to experience signs of anaphylaxis, please return the emergency department for repeat evaluation.  Otherwise follow-up with your primary care provider for further evaluation of your symptoms.

## 2022-09-03 NOTE — ED Triage Notes (Signed)
Patient states that she was bitten by fire ants on bilateral feet, she states that she has taken 2 25mg  benadryl pills, had some hydrocortisone cream on her area of bites.  Patient is able to speak in full sentences, no tongue swelling, no wheezing at this time.  Patient does have some hives.  She states that her voice does not sound normal.  She states that she is having chest pain with the allergic reaction.  She has hives on her trunk and arms.

## 2022-10-17 ENCOUNTER — Ambulatory Visit: Payer: 59 | Attending: Radiation Oncology

## 2022-10-17 VITALS — BP 150/96 | Wt 155.4 lb

## 2022-10-17 DIAGNOSIS — Z483 Aftercare following surgery for neoplasm: Secondary | ICD-10-CM | POA: Insufficient documentation

## 2022-10-17 NOTE — Therapy (Signed)
OUTPATIENT PHYSICAL THERAPY SOZO SCREENING NOTE   Patient Name: [name] [name] MRN: [id number] DOB:[birth year], 62 y.o., female Today's Date: 10/17/2022  PCP: Jerral Ralph, MD REFERRING PROVIDER: Ronny Bacon,*   PT End of Session - 10/17/22 1025     Visit Number 8   # unchanged due to screen only   PT Start Time 1021    PT Stop Time 1027    PT Time Calculation (min) 6 min    Activity Tolerance Patient tolerated treatment well    Behavior During Therapy [facility] for tasks assessed/performed             Past Medical History:  Diagnosis Date   Abnormal bleeding in menstrual cycle    Abnormal Pap smear of vagina    Breast cancer (HCC)    right breast   COVID 08/2019   Endometrial polyp    Hot flashes    Irregular heart beats    Personal history of chemotherapy    Personal history of radiation therapy    PONV (postoperative nausea and vomiting)    Typical atrial flutter (HCC) 01/01/2019   Past Surgical History:  Procedure Laterality Date   BREAST BIOPSY Right 04/05/2021   BREAST IMPLANT REMOVAL Bilateral 2021   breast implants Bilateral 2002   BREAST LUMPECTOMY Right 09/08/2021   BREAST LUMPECTOMY WITH RADIOACTIVE SEED AND SENTINEL LYMPH NODE BIOPSY Right 09/08/2021   Procedure: RIGHT BREAST SEED BRACKETED LUMPECTOMY AND SENTINEL LYMPH NODE BIOPSY;  Surgeon: Almond Lint, MD;  Location: [facility] OR;  Service: General;  Laterality: Right;  GEN & PEC BLOCK   CHOLECYSTECTOMY  1993   PORT-A-CATH REMOVAL Left 06/07/2022   Procedure: REMOVAL PORT-A-CATH;  Surgeon: Almond Lint, MD;  Location: [facility];  Service: General;  Laterality: Left;   PORTACATH PLACEMENT N/A 04/06/2021   Procedure: INSERTION PORT-A-CATH;  Surgeon: Almond Lint, MD;  Location: [facility] OR;  Service: General;  Laterality: N/A;   SHOULDER SURGERY Right    Rotator cuff repair   Patient Active Problem List   Diagnosis Date Noted   Dysuria 06/08/2021   Port-A-Cath in place 05/18/2021    Chemotherapy induced diarrhea 04/27/2021   Rash and nonspecific skin eruption 04/27/2021   UTI (urinary tract infection) 04/27/2021   Bone pain due to granulocyte colony stimulating factor 04/27/2021   Transaminitis 04/27/2021   Genetic testing 03/31/2021   Malignant neoplasm of upper-outer quadrant of right breast in female, estrogen receptor positive (HCC) 03/16/2021   History of cervical dysplasia 02/17/2021   Hot flashes due to menopause 09/16/2019   Typical atrial flutter (HCC) 01/01/2019    REFERRING DIAG: right breast cancer at risk for lymphedema  THERAPY DIAG:  Aftercare following surgery for neoplasm  PERTINENT HISTORY: Patient was diagnosed on 03/04/2021 with right grade II invasive ductal carcinoma breast cancer. It measures 2.2 cm and is located in the upper outer quadrant. It is triple positive with a Ki67 of 25%.  She has a history of breast implants and had a right rotator cuff repair in 2019. Neoadjuvant chemotherapy. Rt lumpectomy and SLNB on 09/08/21 with 5 negative nodes removed.  Drainage noted from incision 09/28/21 with infection. Will be having radiation.   PRECAUTIONS: right UE Lymphedema risk  SUBJECTIVE: Here for SOZO screen "Can you check my BP, it's been high even though I'm on new meds for it."  PAIN:  Are you having pain? No  SOZO SCREENING: Patient was assessed today using the SOZO machine to determine the lymphedema index score. This  was compared to her baseline score. It was determined that she is within the recommended range when compared to her baseline and no further action is needed at this time. She will continue SOZO screenings. These are done every 3 months for 2 years post operatively followed by every 6 months for 2 years, and then annually.   Advised pt to update doctor on elevated BP.  L-DEX FLOWSHEETS - 10/17/22 1000       L-DEX LYMPHEDEMA SCREENING   Measurement Type Unilateral    L-DEX MEASUREMENT EXTREMITY Upper Extremity    POSITION   Standing    DOMINANT SIDE Right    At Risk Side Right    BASELINE SCORE (UNILATERAL) -0.9    L-DEX SCORE (UNILATERAL) 1.5    VALUE CHANGE (UNILAT) 2.4             [name], [place] 10/17/22 10:29 AM

## 2022-11-09 ENCOUNTER — Other Ambulatory Visit: Payer: Self-pay | Admitting: Hematology and Oncology

## 2023-01-16 ENCOUNTER — Ambulatory Visit: Payer: 59 | Attending: Radiation Oncology

## 2023-01-16 VITALS — BP 154/84 | Wt 159.4 lb

## 2023-01-16 DIAGNOSIS — Z483 Aftercare following surgery for neoplasm: Secondary | ICD-10-CM | POA: Insufficient documentation

## 2023-01-16 NOTE — Therapy (Signed)
OUTPATIENT PHYSICAL THERAPY SOZO SCREENING NOTE   Patient Name: [name] [name] MRN: [id number] DOB:1960-12-16, 62 y.o., female Today's Date: 01/16/2023  PCP: Jerral Ralph, MD REFERRING PROVIDER: Ronny Bacon,*   PT End of Session - 01/16/23 1028     Visit Number 8   # unchanged due to screen only   PT Start Time 1025    PT Stop Time 1032    PT Time Calculation (min) 7 min    Activity Tolerance Patient tolerated treatment well    Behavior During Therapy [facility] for tasks assessed/performed             Past Medical History:  Diagnosis Date   Abnormal bleeding in menstrual cycle    Abnormal Pap smear of vagina    Breast cancer (HCC)    right breast   COVID 08/2019   Endometrial polyp    Hot flashes    Irregular heart beats    Personal history of chemotherapy    Personal history of radiation therapy    PONV (postoperative nausea and vomiting)    Typical atrial flutter (HCC) 01/01/2019   Past Surgical History:  Procedure Laterality Date   BREAST BIOPSY Right 04/05/2021   BREAST IMPLANT REMOVAL Bilateral 2021   breast implants Bilateral 2002   BREAST LUMPECTOMY Right 09/08/2021   BREAST LUMPECTOMY WITH RADIOACTIVE SEED AND SENTINEL LYMPH NODE BIOPSY Right 09/08/2021   Procedure: RIGHT BREAST SEED BRACKETED LUMPECTOMY AND SENTINEL LYMPH NODE BIOPSY;  Surgeon: Almond Lint, MD;  Location: [facility] OR;  Service: General;  Laterality: Right;  GEN & PEC BLOCK   CHOLECYSTECTOMY  1993   PORT-A-CATH REMOVAL Left 06/07/2022   Procedure: REMOVAL PORT-A-CATH;  Surgeon: Almond Lint, MD;  Location: [facility];  Service: General;  Laterality: Left;   PORTACATH PLACEMENT N/A 04/06/2021   Procedure: INSERTION PORT-A-CATH;  Surgeon: Almond Lint, MD;  Location: [facility] OR;  Service: General;  Laterality: N/A;   SHOULDER SURGERY Right    Rotator cuff repair   Patient Active Problem List   Diagnosis Date Noted   Dysuria 06/08/2021   Port-A-Cath in place  05/18/2021   Chemotherapy induced diarrhea 04/27/2021   Rash and nonspecific skin eruption 04/27/2021   UTI (urinary tract infection) 04/27/2021   Bone pain due to granulocyte colony stimulating factor 04/27/2021   Transaminitis 04/27/2021   Genetic testing 03/31/2021   Malignant neoplasm of upper-outer quadrant of right breast in female, estrogen receptor positive (HCC) 03/16/2021   History of cervical dysplasia 02/17/2021   Hot flashes due to menopause 09/16/2019   Typical atrial flutter (HCC) 01/01/2019    REFERRING DIAG: right breast cancer at risk for lymphedema  THERAPY DIAG:  Aftercare following surgery for neoplasm  PERTINENT HISTORY: Patient was diagnosed on 03/04/2021 with right grade II invasive ductal carcinoma breast cancer. It measures 2.2 cm and is located in the upper outer quadrant. It is triple positive with a Ki67 of 25%.  She has a history of breast implants and had a right rotator cuff repair in 2019. Neoadjuvant chemotherapy. Rt lumpectomy and SLNB on 09/08/21 with 5 negative nodes removed.  Drainage noted from incision 09/28/21 with infection. Will be having radiation.   PRECAUTIONS: right UE Lymphedema risk  SUBJECTIVE: Here for SOZO screen "I think I've got my BP under control. Can you check it while I'm here?"   PAIN:  Are you having pain? No  SOZO SCREENING: Patient was assessed today using the SOZO machine to determine the lymphedema index score. This  was compared to her baseline score. It was determined that she is within the recommended range when compared to her baseline and no further action is needed at this time. She will continue SOZO screenings. These are done every 3 months for 2 years post operatively followed by every 6 months for 2 years, and then annually.   Advised pt to update doctor on elevated BP.  L-DEX FLOWSHEETS - 01/16/23 1000       L-DEX LYMPHEDEMA SCREENING   Measurement Type Unilateral    L-DEX MEASUREMENT EXTREMITY Upper Extremity     POSITION  Standing    DOMINANT SIDE Right    At Risk Side Right    BASELINE SCORE (UNILATERAL) -0.9    L-DEX SCORE (UNILATERAL) 0.8    VALUE CHANGE (UNILAT) 1.7             Berna Spare, PTA 01/16/23 10:31 AM

## 2023-01-18 ENCOUNTER — Other Ambulatory Visit: Payer: Self-pay

## 2023-04-06 ENCOUNTER — Telehealth: Payer: Self-pay

## 2023-04-06 NOTE — Telephone Encounter (Signed)
 Spoke with patient and confirmed appointment on 04/07/23

## 2023-04-07 ENCOUNTER — Other Ambulatory Visit: Payer: Self-pay

## 2023-04-07 ENCOUNTER — Inpatient Hospital Stay

## 2023-04-07 ENCOUNTER — Inpatient Hospital Stay: Payer: 59 | Attending: Hematology and Oncology | Admitting: Hematology and Oncology

## 2023-04-07 VITALS — BP 125/70 | HR 79 | Temp 98.0°F | Resp 16 | Wt 161.3 lb

## 2023-04-07 DIAGNOSIS — Z79811 Long term (current) use of aromatase inhibitors: Secondary | ICD-10-CM | POA: Insufficient documentation

## 2023-04-07 DIAGNOSIS — Z1721 Progesterone receptor positive status: Secondary | ICD-10-CM | POA: Insufficient documentation

## 2023-04-07 DIAGNOSIS — Z170 Estrogen receptor positive status [ER+]: Secondary | ICD-10-CM | POA: Diagnosis not present

## 2023-04-07 DIAGNOSIS — Z9221 Personal history of antineoplastic chemotherapy: Secondary | ICD-10-CM | POA: Insufficient documentation

## 2023-04-07 DIAGNOSIS — C50411 Malignant neoplasm of upper-outer quadrant of right female breast: Secondary | ICD-10-CM | POA: Diagnosis present

## 2023-04-07 DIAGNOSIS — Z1731 Human epidermal growth factor receptor 2 positive status: Secondary | ICD-10-CM | POA: Diagnosis not present

## 2023-04-07 DIAGNOSIS — Z923 Personal history of irradiation: Secondary | ICD-10-CM | POA: Insufficient documentation

## 2023-04-07 MED ORDER — METHYLPREDNISOLONE 4 MG PO TBPK: ORAL_TABLET | ORAL | 0 refills | Status: AC

## 2023-04-07 NOTE — Progress Notes (Signed)
 Oncology History  Malignant neoplasm of upper-outer quadrant of right breast in female, estrogen receptor positive (HCC)  03/16/2021 Initial Diagnosis   Malignant neoplasm of upper-outer quadrant of right breast in female, estrogen receptor positive (HCC)   04/07/2021 - 07/23/2021 Chemotherapy   Patient is on Treatment Plan : BREAST  Docetaxel + Carboplatin + Trastuzumab + Pertuzumab  (TCHP) q21d       Genetic Testing   [facility] CustomNext Panel was Negative. Report date is 03/28/2021.  The CustomNext-Cancer+RNAinsight panel offered by [employer] [facility] includes sequencing and rearrangement analysis for the following 47 genes:  APC, ATM, AXIN2, BARD1, BMPR1A, BRCA1, BRCA2, BRIP1, CDH1, CDK4, CDKN2A, CHEK2, CTNNA1, DICER1, EPCAM, GREM1, HOXB13, KIT, MEN1, MLH1, MSH2, MSH3, MSH6, MUTYH, NBN, NF1, NTHL1, PALB2, PDGFRA, PMS2, POLD1, POLE, PTEN, RAD50, RAD51C, RAD51D, SDHA, SDHB, SDHC, SDHD, SMAD4, SMARCA4, STK11, TP53, TSC1, TSC2, and VHL.  RNA data is routinely analyzed for use in variant interpretation for all genes.   08/13/2021 -  Chemotherapy   Patient is on Treatment Plan : BREAST Trastuzumab  + Pertuzumab q21d x 13 cycles     09/08/2021 Surgery   Right breast lumpectomy: no residual cancer, 5 SLN negative   10/2021 -  Anti-estrogen oral therapy   Letrozole daily   10/25/2021 - 11/19/2021 Radiation Therapy   Site Technique Total Dose (Gy) Dose per Fx (Gy) Completed Fx Beam Energies  Breast, Right: Breast_R 3D 42.56/42.56 2.66 16/16 6X  Breast, Right: Breast_R_Bst 3D 8/8 2 4/4 6X       INTERVAL HISTORY:   Ms. Sweeny is here for follow up on herceptin/perjeta and letrozole. Since her last visit here, she has been feeling well, staying active. She says overall her energy is not the same, she also gained some weight. She recently went to [place] and had a good time. No changes in her breast She request a medrol dose pak on hand, since she develops allergic reactions from time to time.  Rest of  the pertinent 10 point ROS reviewed and negative  REVIEW OF SYSTEMS:  Review of Systems  Constitutional:  Negative for appetite change, chills, fatigue, fever and unexpected weight change.  HENT:   Negative for hearing loss, lump/mass and trouble swallowing.   Eyes:  Negative for eye problems and icterus.  Respiratory:  Negative for chest tightness, cough and shortness of breath.   Cardiovascular:  Negative for chest pain, leg swelling and palpitations.  Gastrointestinal:  Negative for abdominal distention, abdominal pain, constipation, diarrhea, nausea and vomiting.  Endocrine: Negative for hot flashes.  Genitourinary:  Negative for difficulty urinating.   Musculoskeletal:  Negative for arthralgias.  Skin:  Negative for itching and rash.  Neurological:  Negative for dizziness, extremity weakness, headaches and numbness.  Hematological:  Negative for adenopathy. Does not bruise/bleed easily.  Psychiatric/Behavioral:  Negative for depression. The patient is not nervous/anxious.   Breast: Denies any new nodularity, masses, tenderness, nipple changes, or nipple discharge.    PAST MEDICAL/SURGICAL HISTORY:  Past Medical History:  Diagnosis Date   Abnormal bleeding in menstrual cycle    Abnormal Pap smear of vagina    Breast cancer (HCC)    right breast   COVID 08/2019   Endometrial polyp    Hot flashes    Irregular heart beats    Personal history of chemotherapy    Personal history of radiation therapy    PONV (postoperative nausea and vomiting)    Typical atrial flutter (HCC) 01/01/2019   Past Surgical History:  Procedure Laterality Date  BREAST BIOPSY Right 04/05/2021   BREAST IMPLANT REMOVAL Bilateral 2021   breast implants Bilateral 2002   BREAST LUMPECTOMY Right 09/08/2021   BREAST LUMPECTOMY WITH RADIOACTIVE SEED AND SENTINEL LYMPH NODE BIOPSY Right 09/08/2021   Procedure: RIGHT BREAST SEED BRACKETED LUMPECTOMY AND SENTINEL LYMPH NODE BIOPSY;  Surgeon: Almond Lint, MD;   Location: [facility];  Service: General;  Laterality: Right;  GEN & PEC BLOCK   CHOLECYSTECTOMY  1993   PORT-A-CATH REMOVAL Left 06/07/2022   Procedure: REMOVAL PORT-A-CATH;  Surgeon: Almond Lint, MD;  Location: [facility];  Service: General;  Laterality: Left;   PORTACATH PLACEMENT N/A 04/06/2021   Procedure: INSERTION PORT-A-CATH;  Surgeon: Almond Lint, MD;  Location: [facility];  Service: General;  Laterality: N/A;   SHOULDER SURGERY Right    Rotator cuff repair     ALLERGIES:  Allergies  Allergen Reactions   Bee Venom Swelling     CURRENT MEDICATIONS:  Outpatient Encounter Medications as of 04/07/2023  Medication Sig   acetaminophen (TYLENOL) 500 MG tablet Take 1,000 mg by mouth every 6 (six) hours as needed for moderate pain.   Cetirizine HCl (ZYRTEC ALLERGY PO) Take by mouth.   Cholecalciferol (VITAMIN D3) 125 MCG (5000 UT) CAPS Take 10,000 Units by mouth daily.   EPINEPHrine 0.3 mg/0.3 mL IJ SOAJ injection Inject 0.3 mg into the muscle as needed for anaphylaxis.   exemestane (AROMASIN) 25 MG tablet TAKE 1 TABLET(25 MG) BY MOUTH DAILY AFTER BREAKFAST   lidocaine-prilocaine (EMLA) cream Apply 1 Application topically as needed (port access).   Mag Oxide-Vit D3-Turmeric (MAGNESIUM-VITAMIN D3-TURMERIC PO) Take by mouth.   Multiple Vitamin (MULTIVITAMIN WITH MINERALS) TABS tablet Take 1 tablet by mouth daily.   Nutritional Supplements (FRUIT & VEGETABLE DAILY) CAPS Take 1 capsule by mouth daily.   oxyCODONE (OXY IR/ROXICODONE) 5 MG immediate release tablet Take 1 tablet (5 mg total) by mouth every 6 (six) hours as needed for severe pain.   Probiotic Product (PROBIOTIC DAILY PO) Take 1 tablet by mouth daily.    Zinc Acetate, Oral, (ZINC ACETATE PO) zinc   [DISCONTINUED] prochlorperazine (COMPAZINE) 10 MG tablet Take 1 tablet (10 mg total) by mouth every 6 (six) hours as needed (Nausea or vomiting).   Facility-Administered Encounter Medications as of 04/07/2023  Medication    acetaminophen (TYLENOL) 325 MG tablet   diphenhydrAMINE (BENADRYL) 25 mg capsule     ONCOLOGIC FAMILY HISTORY:  Family History  Problem Relation Age of Onset   Heart attack Mother      SOCIAL HISTORY:  Social History   Socioeconomic History   Marital status: Married    Spouse name: Not on file   Number of children: 2   Years of education: Not on file   Highest education level: Not on file  Occupational History   Not on file  Tobacco Use   Smoking status: Never   Smokeless tobacco: Never  Vaping Use   Vaping status: Never Used  Substance and Sexual Activity   Alcohol use: Yes    Alcohol/week: 14.0 standard drinks of alcohol    Types: 14 Standard drinks or equivalent per week    Comment: 2 white claws every night with dinner   Drug use: Not Currently   Sexual activity: Not on file  Other Topics Concern   Not on file  Social History Narrative   Not on file   Social Drivers of Health   Financial Resource Strain: Not on file  Food Insecurity: Not on file  Transportation Needs: Not on file  Physical Activity: Not on file  Stress: Not on file  Social Connections: Not on file  Intimate Partner Violence: Not on file     OBSERVATIONS/OBJECTIVE:  BP 125/70 (BP Location: Left Arm, Patient Position: Sitting)   Pulse 79   Temp 98 F (36.7 C) (Temporal)   Resp 16   Wt 161 lb 4.8 oz (73.2 kg)   LMP 09/09/2019   SpO2 97%   BMI 29.50 kg/m   Physical Exam Constitutional:      Appearance: Normal appearance.  Cardiovascular:     Rate and Rhythm: Normal rate and regular rhythm.     Pulses: Normal pulses.     Heart sounds: Normal heart sounds.  Pulmonary:     Effort: Pulmonary effort is normal.     Breath sounds: Normal breath sounds.  Chest:     Comments: Right breast s.p surgical changes, no concern for recurence. Left breast normal. No regional adenopathy Abdominal:     General: Abdomen is flat.     Palpations: Abdomen is soft.  Musculoskeletal:         General: No swelling.     Cervical back: Normal range of motion and neck supple. No rigidity.  Lymphadenopathy:     Cervical: No cervical adenopathy.  Skin:    General: Skin is warm and dry.  Neurological:     General: No focal deficit present.     Mental Status: She is alert.    LABORATORY DATA:  None for this visit.  DIAGNOSTIC IMAGING:  None for this visit.    ASSESSMENT ND PLAN:  Ms.. Maravilla is a pleasant 63 y.o. 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    She is doing well currently. No concerns for recurrence on ROS or PE She is interested in Kenel reveal, MRD testing every 6 months. Last mammogram June 2024, no concerns. Baseline Bone density normal. She will RTC in 1 yr or sooner as needed. Medrol dose Pak sent per pt request  Total time spent: 20 minutes  *Total Encounter Time as defined by the Centers for Medicare and Medicaid Services includes, in addition to the face-to-face time of a patient visit (documented in the note above) non-face-to-face time: obtaining and reviewing outside history, ordering and reviewing medications, tests or procedures, care coordination (communications with other health care professionals or caregivers) and documentation in the medical record.

## 2023-04-09 ENCOUNTER — Other Ambulatory Visit: Payer: Self-pay

## 2023-04-10 ENCOUNTER — Encounter: Payer: Self-pay | Admitting: Hematology and Oncology

## 2023-04-10 ENCOUNTER — Encounter: Payer: Self-pay | Admitting: Adult Health

## 2023-04-17 ENCOUNTER — Ambulatory Visit: Payer: 59 | Attending: Hematology and Oncology

## 2023-04-18 ENCOUNTER — Encounter: Payer: Self-pay | Admitting: Hematology and Oncology

## 2023-04-20 LAB — GUARDANT REVEAL

## 2023-04-21 ENCOUNTER — Telehealth: Payer: Self-pay | Admitting: *Deleted

## 2023-04-21 NOTE — Telephone Encounter (Signed)
 Called pt to inform of Guardant Reveal results- obtained answering machine requested for pt to return call to this RN

## 2023-04-26 ENCOUNTER — Encounter: Payer: Self-pay | Admitting: Hematology and Oncology

## 2023-04-26 ENCOUNTER — Encounter: Payer: Self-pay | Admitting: Adult Health

## 2023-05-08 ENCOUNTER — Ambulatory Visit: Attending: Hematology and Oncology

## 2023-05-08 VITALS — Wt 161.1 lb

## 2023-05-08 DIAGNOSIS — Z483 Aftercare following surgery for neoplasm: Secondary | ICD-10-CM | POA: Insufficient documentation

## 2023-05-08 NOTE — Therapy (Signed)
 OUTPATIENT PHYSICAL THERAPY SOZO SCREENING NOTE   Patient Name: [name] [name] MRN: [id number] DOB:10-14-1960, 63 y.o., female Today's Date: 05/08/2023  PCP: Jerral Ralph, MD REFERRING PROVIDER: Rachel Moulds, MD   PT End of Session - 05/08/23 0933     Visit Number 8   # unchanged due to screen only   PT Start Time 0930    PT Stop Time 0935    PT Time Calculation (min) 5 min    Activity Tolerance Patient tolerated treatment well    Behavior During Therapy [facility] for tasks assessed/performed             Past Medical History:  Diagnosis Date   Abnormal bleeding in menstrual cycle    Abnormal Pap smear of vagina    Breast cancer (HCC)    right breast   COVID 08/2019   Endometrial polyp    Hot flashes    Irregular heart beats    Personal history of chemotherapy    Personal history of radiation therapy    PONV (postoperative nausea and vomiting)    Typical atrial flutter (HCC) 01/01/2019   Past Surgical History:  Procedure Laterality Date   BREAST BIOPSY Right 04/05/2021   BREAST IMPLANT REMOVAL Bilateral 2021   breast implants Bilateral 2002   BREAST LUMPECTOMY Right 09/08/2021   BREAST LUMPECTOMY WITH RADIOACTIVE SEED AND SENTINEL LYMPH NODE BIOPSY Right 09/08/2021   Procedure: RIGHT BREAST SEED BRACKETED LUMPECTOMY AND SENTINEL LYMPH NODE BIOPSY;  Surgeon: Almond Lint, MD;  Location: [facility];  Service: General;  Laterality: Right;  GEN & PEC BLOCK   CHOLECYSTECTOMY  1993   PORT-A-CATH REMOVAL Left 06/07/2022   Procedure: REMOVAL PORT-A-CATH;  Surgeon: Almond Lint, MD;  Location: [facility];  Service: General;  Laterality: Left;   PORTACATH PLACEMENT N/A 04/06/2021   Procedure: INSERTION PORT-A-CATH;  Surgeon: Almond Lint, MD;  Location: [facility];  Service: General;  Laterality: N/A;   SHOULDER SURGERY Right    Rotator cuff repair   Patient Active Problem List   Diagnosis Date Noted   Dysuria 06/08/2021   Port-A-Cath in place 05/18/2021    Chemotherapy induced diarrhea 04/27/2021   Rash and nonspecific skin eruption 04/27/2021   UTI (urinary tract infection) 04/27/2021   Bone pain due to granulocyte colony stimulating factor 04/27/2021   Transaminitis 04/27/2021   Genetic testing 03/31/2021   Malignant neoplasm of upper-outer quadrant of right breast in female, estrogen receptor positive (HCC) 03/16/2021   History of cervical dysplasia 02/17/2021   Hot flashes due to menopause 09/16/2019   Typical atrial flutter (HCC) 01/01/2019    REFERRING DIAG: right breast cancer at risk for lymphedema  THERAPY DIAG:  Aftercare following surgery for neoplasm  PERTINENT HISTORY: Patient was diagnosed on 03/04/2021 with right grade II invasive ductal carcinoma breast cancer. It measures 2.2 cm and is located in the upper outer quadrant. It is triple positive with a Ki67 of 25%.  She has a history of breast implants and had a right rotator cuff repair in 2019. Neoadjuvant chemotherapy. Rt lumpectomy and SLNB on 09/08/21 with 5 negative nodes removed.  Drainage noted from incision 09/28/21 with infection. Will be having radiation.   PRECAUTIONS: right UE Lymphedema risk  SUBJECTIVE: Pt returns for her 3 month L-Dex screen.   PAIN:  Are you having pain? No  SOZO SCREENING: Patient was assessed today using the SOZO machine to determine the lymphedema index score. This was compared to her baseline score. It was determined that she  is within the recommended range when compared to her baseline and no further action is needed at this time. She will continue SOZO screenings. These are done every 3 months for 2 years post operatively followed by every 6 months for 2 years, and then annually.   Advised pt to update doctor on elevated BP.  L-DEX FLOWSHEETS - 05/08/23 0900       L-DEX LYMPHEDEMA SCREENING   Measurement Type Unilateral    L-DEX MEASUREMENT EXTREMITY Upper Extremity    POSITION  Standing    DOMINANT SIDE Right    At Risk Side  Right    BASELINE SCORE (UNILATERAL) -0.9    L-DEX SCORE (UNILATERAL) 2.3    VALUE CHANGE (UNILAT) 3.2             Roslynn Coombes, PTA 05/08/23 9:34 AM

## 2023-06-16 ENCOUNTER — Telehealth: Payer: Self-pay | Admitting: *Deleted

## 2023-06-16 NOTE — Telephone Encounter (Signed)
 This RN spoke with patient per her call stating she is starting to have some joint stiffness with the exemestane  - ( has been on it over 6 months ) but symptoms not as severe as prior

## 2023-06-26 ENCOUNTER — Encounter: Payer: Self-pay | Admitting: Adult Health

## 2023-06-26 ENCOUNTER — Encounter: Payer: Self-pay | Admitting: Hematology and Oncology

## 2023-07-12 ENCOUNTER — Telehealth: Payer: Self-pay | Admitting: *Deleted

## 2023-07-12 NOTE — Telephone Encounter (Signed)
 Pt called and left message requesting medication for pain in hands. Called pt to get more information. Left vm to call office to evaluate further.

## 2023-07-14 ENCOUNTER — Other Ambulatory Visit: Payer: Self-pay

## 2023-07-14 MED ORDER — EXEMESTANE 25 MG PO TABS: 25.0000 mg | ORAL_TABLET | Freq: Every day | ORAL | 3 refills | Status: AC

## 2023-07-19 ENCOUNTER — Telehealth: Payer: Self-pay | Admitting: *Deleted

## 2023-07-19 NOTE — Telephone Encounter (Signed)
 Pt left a VM stating she has  2 or 3 eyelashes that are infected and need you to send in an antibiotic for me to [facility]  This RN returned call -obtained VM- left detailed message informing pt above issue needs to be addressed by her primary MD or opthalmology for best outcome as she in not under active chemotherapy -    This RN left her name for any return call concerns.

## 2023-07-31 ENCOUNTER — Encounter: Payer: Self-pay | Admitting: Adult Health

## 2023-07-31 ENCOUNTER — Encounter: Payer: Self-pay | Admitting: Hematology and Oncology

## 2023-08-02 ENCOUNTER — Other Ambulatory Visit: Payer: Self-pay | Admitting: Obstetrics and Gynecology

## 2023-08-02 DIAGNOSIS — Z8249 Family history of ischemic heart disease and other diseases of the circulatory system: Secondary | ICD-10-CM

## 2023-08-07 ENCOUNTER — Ambulatory Visit: Payer: Self-pay | Attending: Hematology and Oncology

## 2023-08-07 VITALS — Wt 162.1 lb

## 2023-08-07 DIAGNOSIS — Z483 Aftercare following surgery for neoplasm: Secondary | ICD-10-CM | POA: Insufficient documentation

## 2023-08-07 NOTE — Therapy (Signed)
 OUTPATIENT PHYSICAL THERAPY SOZO SCREENING NOTE   Patient Name: [name] [name] MRN: [id number] DOB:Aug 01, 1960, 63 y.o., female Today's Date: 08/07/2023  PCP: Billy Corean PARAS, MD REFERRING PROVIDER: Loretha Ash, MD   PT End of Session - 08/07/23 [phone]     Visit Number 8   # unchanged due to screen only   PT Start Time 0924    PT Stop Time 0928    PT Time Calculation (min) 4 min    Activity Tolerance Patient tolerated treatment well    Behavior During Therapy [facility] for tasks assessed/performed          Past Medical History:  Diagnosis Date   Abnormal bleeding in menstrual cycle    Abnormal Pap smear of vagina    Breast cancer (HCC)    right breast   COVID 08/2019   Endometrial polyp    Hot flashes    Irregular heart beats    Personal history of chemotherapy    Personal history of radiation therapy    PONV (postoperative nausea and vomiting)    Typical atrial flutter (HCC) 01/01/2019   Past Surgical History:  Procedure Laterality Date   BREAST BIOPSY Right 04/05/2021   BREAST IMPLANT REMOVAL Bilateral 2021   breast implants Bilateral 2002   BREAST LUMPECTOMY Right 09/08/2021   BREAST LUMPECTOMY WITH RADIOACTIVE SEED AND SENTINEL LYMPH NODE BIOPSY Right 09/08/2021   Procedure: RIGHT BREAST SEED BRACKETED LUMPECTOMY AND SENTINEL LYMPH NODE BIOPSY;  Surgeon: Aron Shoulders, MD;  Location: [facility];  Service: General;  Laterality: Right;  GEN & PEC BLOCK   CHOLECYSTECTOMY  1993   PORT-A-CATH REMOVAL Left 06/07/2022   Procedure: REMOVAL PORT-A-CATH;  Surgeon: Aron Shoulders, MD;  Location: [facility];  Service: General;  Laterality: Left;   PORTACATH PLACEMENT N/A 04/06/2021   Procedure: INSERTION PORT-A-CATH;  Surgeon: Aron Shoulders, MD;  Location: [facility];  Service: General;  Laterality: N/A;   SHOULDER SURGERY Right    Rotator cuff repair   Patient Active Problem List   Diagnosis Date Noted   Dysuria 06/08/2021   Port-A-Cath in place 05/18/2021    Chemotherapy induced diarrhea 04/27/2021   Rash and nonspecific skin eruption 04/27/2021   UTI (urinary tract infection) 04/27/2021   Bone pain due to granulocyte colony stimulating factor 04/27/2021   Transaminitis 04/27/2021   Genetic testing 03/31/2021   Malignant neoplasm of upper-outer quadrant of right breast in female, estrogen receptor positive (HCC) 03/16/2021   History of cervical dysplasia 02/17/2021   Hot flashes due to menopause 09/16/2019   Typical atrial flutter (HCC) 01/01/2019    REFERRING DIAG: right breast cancer at risk for lymphedema  THERAPY DIAG: Aftercare following surgery for neoplasm  PERTINENT HISTORY: Patient was diagnosed on 03/04/2021 with right grade II invasive ductal carcinoma breast cancer. It measures 2.2 cm and is located in the upper outer quadrant. It is triple positive with a Ki67 of 25%.  She has a history of breast implants and had a right rotator cuff repair in 2019. Neoadjuvant chemotherapy. Rt lumpectomy and SLNB on 09/08/21 with 5 negative nodes removed.  Drainage noted from incision 09/28/21 with infection. Will be having radiation.   PRECAUTIONS: right UE Lymphedema risk  SUBJECTIVE: Pt returns for her last 3 month L-Dex screen.   PAIN:  Are you having pain? No  SOZO SCREENING: Patient was assessed today using the SOZO machine to determine the lymphedema index score. This was compared to her baseline score. It was determined that she is within the  recommended range when compared to her baseline and no further action is needed at this time. She will continue SOZO screenings. These are done every 3 months for 2 years post operatively followed by every 6 months for 2 years, and then annually.   Advised pt to update doctor on elevated BP.  L-DEX FLOWSHEETS - 08/07/23 0900       L-DEX LYMPHEDEMA SCREENING   Measurement Type Unilateral    L-DEX MEASUREMENT EXTREMITY Upper Extremity    POSITION  Standing    DOMINANT SIDE Right    At Risk Side  Right    BASELINE SCORE (UNILATERAL) -0.9    L-DEX SCORE (UNILATERAL) 0.7    VALUE CHANGE (UNILAT) 1.6            Berwyn Knights, PTA 08/07/23 9:27 AM

## 2023-08-21 ENCOUNTER — Ambulatory Visit
Admission: RE | Admit: 2023-08-21 | Discharge: 2023-08-21 | Disposition: A | Discharge status: Home / Self Care | Payer: Self-pay | Source: Ambulatory Visit | Attending: Obstetrics and Gynecology | Admitting: Obstetrics and Gynecology

## 2023-08-21 ENCOUNTER — Encounter: Payer: Self-pay | Admitting: Adult Health

## 2023-08-21 ENCOUNTER — Encounter: Payer: Self-pay | Admitting: Hematology and Oncology

## 2023-08-21 ENCOUNTER — Ambulatory Visit
Admission: RE | Admit: 2023-08-21 | Discharge: 2023-08-21 | Disposition: A | Discharge status: Home / Self Care | Payer: Self-pay | Source: Ambulatory Visit | Attending: Hematology and Oncology

## 2023-08-21 DIAGNOSIS — Z170 Estrogen receptor positive status [ER+]: Secondary | ICD-10-CM

## 2023-08-21 DIAGNOSIS — Z8249 Family history of ischemic heart disease and other diseases of the circulatory system: Secondary | ICD-10-CM

## 2023-10-09 ENCOUNTER — Telehealth: Payer: Self-pay | Admitting: *Deleted

## 2023-10-09 NOTE — Telephone Encounter (Signed)
 Called pt and called pt daughter Valery and made aware to call and make aware of negative Guardant Reveal results. Pt daughter verbalized understanding.

## 2024-01-09 ENCOUNTER — Telehealth: Payer: Self-pay | Admitting: Hematology and Oncology

## 2024-01-09 NOTE — Telephone Encounter (Signed)
 I left voicemail for pt regarding 04/08/24 appt being rescheduled to 04/09/24.

## 2024-01-10 ENCOUNTER — Other Ambulatory Visit: Payer: Self-pay

## 2024-02-05 ENCOUNTER — Ambulatory Visit: Payer: Self-pay | Attending: Hematology and Oncology

## 2024-02-05 VITALS — Wt 163.1 lb

## 2024-02-05 DIAGNOSIS — Z483 Aftercare following surgery for neoplasm: Secondary | ICD-10-CM | POA: Insufficient documentation

## 2024-02-05 NOTE — Therapy (Signed)
 " OUTPATIENT PHYSICAL THERAPY SOZO SCREENING NOTE   Patient Name: [name] [name] MRN: [id number] DOB:05-21-1960, 64 y.o., female Today's Date: 02/05/2024  PCP: Billy Corean PARAS, MD REFERRING PROVIDER: Loretha Ash, MD   PT End of Session - 02/05/24 1023     Visit Number 8   # unchanged due to screen only   PT Start Time 1021    PT Stop Time 1025    PT Time Calculation (min) 4 min    Activity Tolerance Patient tolerated treatment well    Behavior During Therapy [facility] for tasks assessed/performed          Past Medical History:  Diagnosis Date   Abnormal bleeding in menstrual cycle    Abnormal Pap smear of vagina    Breast cancer (HCC)    right breast   COVID 08/2019   Endometrial polyp    Hot flashes    Irregular heart beats    Personal history of chemotherapy    Personal history of radiation therapy    PONV (postoperative nausea and vomiting)    Typical atrial flutter (HCC) 01/01/2019   Past Surgical History:  Procedure Laterality Date   BREAST BIOPSY Right 04/05/2021   BREAST IMPLANT REMOVAL Bilateral 2021   breast implants Bilateral 2002   BREAST LUMPECTOMY Right 09/08/2021   BREAST LUMPECTOMY WITH RADIOACTIVE SEED AND SENTINEL LYMPH NODE BIOPSY Right 09/08/2021   Procedure: RIGHT BREAST SEED BRACKETED LUMPECTOMY AND SENTINEL LYMPH NODE BIOPSY;  Surgeon: Aron Shoulders, MD;  Location: [facility];  Service: General;  Laterality: Right;  GEN & PEC BLOCK   CHOLECYSTECTOMY  1993   PORT-A-CATH REMOVAL Left 06/07/2022   Procedure: REMOVAL PORT-A-CATH;  Surgeon: Aron Shoulders, MD;  Location: [facility];  Service: General;  Laterality: Left;   PORTACATH PLACEMENT N/A 04/06/2021   Procedure: INSERTION PORT-A-CATH;  Surgeon: Aron Shoulders, MD;  Location: [facility];  Service: General;  Laterality: N/A;   SHOULDER SURGERY Right    Rotator cuff repair   Patient Active Problem List   Diagnosis Date Noted   Dysuria 06/08/2021   Port-A-Cath in place 05/18/2021    Chemotherapy induced diarrhea 04/27/2021   Rash and nonspecific skin eruption 04/27/2021   UTI (urinary tract infection) 04/27/2021   Bone pain due to granulocyte colony stimulating factor 04/27/2021   Transaminitis 04/27/2021   Genetic testing 03/31/2021   Malignant neoplasm of upper-outer quadrant of right breast in female, estrogen receptor positive (HCC) 03/16/2021   History of cervical dysplasia 02/17/2021   Hot flashes due to menopause 09/16/2019   Typical atrial flutter (HCC) 01/01/2019    REFERRING DIAG: right breast cancer at risk for lymphedema  THERAPY DIAG: Aftercare following surgery for neoplasm  PERTINENT HISTORY: Patient was diagnosed on 03/04/2021 with right grade II invasive ductal carcinoma breast cancer. It measures 2.2 cm and is located in the upper outer quadrant. It is triple positive with a Ki67 of 25%.  She has a history of breast implants and had a right rotator cuff repair in 2019. Neoadjuvant chemotherapy. Rt lumpectomy and SLNB on 09/08/21 with 5 negative nodes removed.  Drainage noted from incision 09/28/21 with infection. Will be having radiation.   PRECAUTIONS: right UE Lymphedema risk  SUBJECTIVE: Pt returns for her 6 month L-Dex screen.   PAIN:  Are you having pain? No  SOZO SCREENING: Patient was assessed today using the SOZO machine to determine the lymphedema index score. This was compared to her baseline score. It was determined that she is within the  recommended range when compared to her baseline and no further action is needed at this time. She will continue SOZO screenings. These are done every 3 months for 2 years post operatively followed by every 6 months for 2 years, and then annually.   Advised pt to update doctor on elevated BP.  L-DEX FLOWSHEETS - 02/05/24 1000       L-DEX LYMPHEDEMA SCREENING   Measurement Type Unilateral    L-DEX MEASUREMENT EXTREMITY Upper Extremity    POSITION  Standing    DOMINANT SIDE Right    At Risk Side Right     BASELINE SCORE (UNILATERAL) -0.9    L-DEX SCORE (UNILATERAL) 0.9    VALUE CHANGE (UNILAT) 1.8          P: Cont every 6 months until 08/2025, then can transition to annual.   Berwyn Knights, PTA 02/05/2024 10:25 AM      "

## 2024-02-06 ENCOUNTER — Other Ambulatory Visit: Payer: Self-pay

## 2024-04-08 ENCOUNTER — Ambulatory Visit: Admitting: Hematology and Oncology

## 2024-04-09 ENCOUNTER — Inpatient Hospital Stay: Payer: Self-pay | Admitting: Hematology and Oncology

## 2024-08-05 ENCOUNTER — Ambulatory Visit
